# Patient Record
Sex: Female | Born: 1950 | Race: White | Hispanic: No | Smoking: Current some day smoker
Health system: Southern US, Community
[De-identification: ages and names within clinical notes are randomized; demographics above are authoritative.]

## PROBLEM LIST (undated history)

## (undated) DIAGNOSIS — Z789 Other specified health status: Secondary | ICD-10-CM

## (undated) DIAGNOSIS — E782 Mixed hyperlipidemia: Secondary | ICD-10-CM

## (undated) DIAGNOSIS — I214 Non-ST elevation (NSTEMI) myocardial infarction: Secondary | ICD-10-CM

## (undated) DIAGNOSIS — I219 Acute myocardial infarction, unspecified: Secondary | ICD-10-CM

## (undated) DIAGNOSIS — I10 Essential (primary) hypertension: Secondary | ICD-10-CM

## (undated) DIAGNOSIS — I2510 Atherosclerotic heart disease of native coronary artery without angina pectoris: Secondary | ICD-10-CM

## (undated) DIAGNOSIS — E669 Obesity, unspecified: Secondary | ICD-10-CM

## (undated) DIAGNOSIS — Z720 Tobacco use: Secondary | ICD-10-CM

## (undated) DIAGNOSIS — Z8719 Personal history of other diseases of the digestive system: Secondary | ICD-10-CM

## (undated) DIAGNOSIS — I82409 Acute embolism and thrombosis of unspecified deep veins of unspecified lower extremity: Secondary | ICD-10-CM

## (undated) HISTORY — PX: APPENDECTOMY: SHX54

## (undated) HISTORY — PX: MASS EXCISION: SHX2000

## (undated) HISTORY — PX: BREAST BIOPSY: SHX20

## (undated) HISTORY — DX: Mixed hyperlipidemia: E78.2

## (undated) HISTORY — PX: CATARACT EXTRACTION W/ INTRAOCULAR LENS IMPLANT: SHX1309

## (undated) HISTORY — DX: Acute myocardial infarction, unspecified: I21.9

## (undated) HISTORY — DX: Atherosclerotic heart disease of native coronary artery without angina pectoris: I25.10

---

## 1990-04-29 DIAGNOSIS — I82409 Acute embolism and thrombosis of unspecified deep veins of unspecified lower extremity: Secondary | ICD-10-CM

## 1990-04-29 HISTORY — DX: Acute embolism and thrombosis of unspecified deep veins of unspecified lower extremity: I82.409

## 2002-12-23 ENCOUNTER — Encounter: Payer: Self-pay | Admitting: Family Medicine

## 2002-12-23 ENCOUNTER — Ambulatory Visit (HOSPITAL_COMMUNITY): Admission: RE | Admit: 2002-12-23 | Discharge: 2002-12-23 | Payer: Self-pay

## 2002-12-29 ENCOUNTER — Encounter: Admission: RE | Admit: 2002-12-29 | Discharge: 2002-12-29 | Payer: Self-pay | Admitting: Family Medicine

## 2002-12-29 ENCOUNTER — Encounter: Payer: Self-pay | Admitting: Family Medicine

## 2003-08-04 ENCOUNTER — Ambulatory Visit (HOSPITAL_COMMUNITY): Admission: RE | Admit: 2003-08-04 | Discharge: 2003-08-04 | Payer: Self-pay | Admitting: Family Medicine

## 2006-02-05 ENCOUNTER — Ambulatory Visit (HOSPITAL_COMMUNITY): Admission: RE | Admit: 2006-02-05 | Discharge: 2006-02-05 | Payer: Self-pay | Admitting: Ophthalmology

## 2009-04-29 HISTORY — PX: CORONARY ANGIOPLASTY WITH STENT PLACEMENT: SHX49

## 2009-08-27 DIAGNOSIS — I219 Acute myocardial infarction, unspecified: Secondary | ICD-10-CM

## 2009-08-27 HISTORY — DX: Acute myocardial infarction, unspecified: I21.9

## 2011-02-15 ENCOUNTER — Encounter: Payer: Self-pay | Admitting: Cardiology

## 2011-02-18 ENCOUNTER — Ambulatory Visit: Payer: Self-pay | Admitting: Cardiology

## 2011-02-18 ENCOUNTER — Encounter: Payer: Self-pay | Admitting: Cardiology

## 2011-02-20 ENCOUNTER — Encounter: Payer: Self-pay | Admitting: Cardiology

## 2011-02-20 ENCOUNTER — Other Ambulatory Visit: Payer: Self-pay

## 2011-02-20 ENCOUNTER — Ambulatory Visit (INDEPENDENT_AMBULATORY_CARE_PROVIDER_SITE_OTHER): Payer: BC Managed Care – PPO | Admitting: Cardiology

## 2011-02-20 VITALS — BP 118/75 | HR 69 | Ht 64.0 in | Wt 200.8 lb

## 2011-02-20 DIAGNOSIS — E782 Mixed hyperlipidemia: Secondary | ICD-10-CM

## 2011-02-20 DIAGNOSIS — F17200 Nicotine dependence, unspecified, uncomplicated: Secondary | ICD-10-CM | POA: Insufficient documentation

## 2011-02-20 DIAGNOSIS — E785 Hyperlipidemia, unspecified: Secondary | ICD-10-CM | POA: Insufficient documentation

## 2011-02-20 DIAGNOSIS — I2510 Atherosclerotic heart disease of native coronary artery without angina pectoris: Secondary | ICD-10-CM

## 2011-02-20 DIAGNOSIS — R609 Edema, unspecified: Secondary | ICD-10-CM | POA: Insufficient documentation

## 2011-02-20 MED ORDER — NITROGLYCERIN 0.4 MG SL SUBL: 0.4000 mg | SUBLINGUAL_TABLET | SUBLINGUAL | Status: DC | PRN

## 2011-02-20 NOTE — Assessment & Plan Note (Signed)
Most likely related to venous insufficiency. We discussed sodium restriction and weight loss. She has also used compression hose on the left.

## 2011-02-20 NOTE — Assessment & Plan Note (Signed)
Reasonably stable from a symptom perspective. ECG is reviewed, overall improved in comparison to old tracings. Plan at this point is continuing medical therapy and observation. 2-D echocardiogram is being obtained to ensure that LVEF remains normal, particularly in light of her edema. LVEF was 40% around the time of her infarct, although reportedly improved to normal subsequent to intervention. We did discuss diet and exercise with a goal of weight loss, also smoking cessation.

## 2011-02-20 NOTE — Assessment & Plan Note (Signed)
Followup fasting lipid profile and liver function tests on Crestor.

## 2011-02-20 NOTE — Patient Instructions (Signed)
**Note De-identified Vallarie Fei Obfuscation** Your physician has requested that you have an echocardiogram. Echocardiography is a painless test that uses sound waves to create images of your heart. It provides your doctor with information about the size and shape of your heart and how well your heart's chambers and valves are working. This procedure takes approximately one hour. There are no restrictions for this procedure.  Your physician recommends that you return for lab work in: today  Your physician recommends that you schedule a follow-up appointment in: 6 months   

## 2011-02-20 NOTE — Assessment & Plan Note (Signed)
Were discussed smoking cessation strategies today.

## 2011-02-20 NOTE — Progress Notes (Signed)
Clinical Summary Stacy Douglas is a 60 y.o.female presenting to establish cardiology followup. She recently moved here from [place], and has not yet established with a primary care physician. Cardiac history was reviewed. She was previously followed by a Dr. Kathe Douglas status post interventions as outlined. She did undergo an exercise echocardiogram in June of last year which was reportedly normal, with no ischemia, and LVEF improved to 65%.  She reports occasional, fleeting, atypical chest pain, but is not specifically exertional. No progressive shortness of breath. She states she has gained a significant amount of weight, and has not been exercising. She reports traveling a lot until recently, and eating out.  She has had some left ankle edema, which has been a problem for her intermittently over the years following reportedly a "blood clot" several years ago around pregnancy. She has used compression hose on that side before, and presumably has a degree of residual venous insufficiency.  No recent followup lab work. She reports compliance with her medications.  She has cut back cigarette smoking, although not completely stopped. We discussed smoking cessation today, diet, sodium restriction, and exercise plan.  No Known Allergies  Medication list reviewed.  Past Medical History  Diagnosis Date  . Coronary atherosclerosis of native coronary artery     BMS LAD 5/11, PTCA circumflex 6/11, LVEF 40%  . Mixed hyperlipidemia   . 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 Type 2 diabetes mellitus   . Essential hypertension, benign     Past Surgical History  Procedure Date  . Cervical spine surgery   . Appendectomy   . Right breast biopsy     Family History  Problem Relation Age of Onset  . Coronary artery disease Father     MI age 74    Social History Stacy Douglas reports that she has been smoking Cigarettes.  She does not have any smokeless tobacco history on  file. Stacy Douglas reports that she drinks alcohol.  Review of Systems No palpitations or syncope. No orthopnea or PND. No reported bleeding problems on DAPT. Otherwise negative.  Physical Examination Filed Vitals:   02/20/11 0855  BP: 118/75  Pulse: 69   Overweight woman in no acute distress. HEENT: Conjunctiva and lids normal, oropharynx with moist mucosa. Neck: Supple, no elevated JVP or carotid bruits, no thyromegaly. Lungs: Clear to auscultation, nonlabored. Cardiac: Regular rate and rhythm, no significant murmur or gallop, no rub. Abdomen: Soft, nontender, bowel sounds present, no bruits. Skin: Warm and dry. Extremities: No pitting edema, distal pulses full. There is trace left ankle edema. Musculoskeletal: No kyphosis. Neuropsychiatric: Alert and oriented x3, affect appropriate.  ECG Sinus rhythm at 69 with borderline low voltage, nonspecific T-wave changes.    Problem List and Plan

## 2011-02-21 ENCOUNTER — Ambulatory Visit (HOSPITAL_COMMUNITY)
Admission: RE | Admit: 2011-02-21 | Discharge: 2011-02-21 | Disposition: A | Discharge status: Home / Self Care | Payer: BC Managed Care – PPO | Source: Ambulatory Visit | Attending: Cardiology | Admitting: Cardiology

## 2011-02-21 DIAGNOSIS — I2510 Atherosclerotic heart disease of native coronary artery without angina pectoris: Secondary | ICD-10-CM

## 2011-02-21 DIAGNOSIS — E785 Hyperlipidemia, unspecified: Secondary | ICD-10-CM | POA: Insufficient documentation

## 2011-02-21 DIAGNOSIS — R609 Edema, unspecified: Secondary | ICD-10-CM

## 2011-02-21 DIAGNOSIS — F17200 Nicotine dependence, unspecified, uncomplicated: Secondary | ICD-10-CM | POA: Insufficient documentation

## 2011-02-21 DIAGNOSIS — R0789 Other chest pain: Secondary | ICD-10-CM | POA: Insufficient documentation

## 2011-02-21 LAB — LIPID PANEL
LDL Cholesterol: 79 mg/dL (ref 0–99)
Total CHOL/HDL Ratio: 3.2 Ratio

## 2011-02-21 LAB — HEPATIC FUNCTION PANEL
AST: 16 U/L (ref 0–37)
Alkaline Phosphatase: 68 U/L (ref 39–117)
Total Bilirubin: 0.4 mg/dL (ref 0.3–1.2)

## 2011-02-21 NOTE — Progress Notes (Signed)
*  PRELIMINARY RESULTS* Echocardiogram 2D Echocardiogram has been performed.  Stacy Douglas 02/21/2011, 8:49 AM

## 2011-02-22 ENCOUNTER — Telehealth: Payer: Self-pay | Admitting: Cardiology

## 2011-02-22 NOTE — Telephone Encounter (Signed)
Would like results of Echo and blood work done on 02/21/11.Marland Kitchen / tg

## 2011-04-29 ENCOUNTER — Telehealth: Payer: Self-pay | Admitting: Cardiology

## 2011-04-29 MED ORDER — NITROGLYCERIN 0.4 MG SL SUBL: 0.4000 mg | SUBLINGUAL_TABLET | SUBLINGUAL | Status: DC | PRN

## 2011-04-29 MED ORDER — METOPROLOL SUCCINATE ER 50 MG PO TB24: 25.0000 mg | ORAL_TABLET | Freq: Every day | ORAL | Status: DC

## 2011-04-29 MED ORDER — ROSUVASTATIN CALCIUM 5 MG PO TABS: 5.0000 mg | ORAL_TABLET | Freq: Every day | ORAL | Status: DC

## 2011-04-29 MED ORDER — RAMIPRIL 1.25 MG PO CAPS: 1.2500 mg | ORAL_CAPSULE | Freq: Every day | ORAL | Status: DC

## 2011-04-29 MED ORDER — CLOPIDOGREL BISULFATE 75 MG PO TABS: 75.0000 mg | ORAL_TABLET | Freq: Every day | ORAL | Status: DC

## 2011-04-29 NOTE — Telephone Encounter (Signed)
Patient needs her RX's called to [facility] in [place].  States they have all expired. / tg

## 2011-07-09 ENCOUNTER — Emergency Department (HOSPITAL_COMMUNITY)
Admission: EM | Admit: 2011-07-09 | Discharge: 2011-07-09 | Disposition: A | Discharge status: Home Health | Payer: BC Managed Care – PPO | Attending: Emergency Medicine | Admitting: Emergency Medicine

## 2011-07-09 ENCOUNTER — Other Ambulatory Visit: Payer: Self-pay

## 2011-07-09 ENCOUNTER — Emergency Department (HOSPITAL_COMMUNITY): Payer: BC Managed Care – PPO

## 2011-07-09 ENCOUNTER — Encounter (HOSPITAL_COMMUNITY): Payer: Self-pay | Admitting: Emergency Medicine

## 2011-07-09 DIAGNOSIS — M549 Dorsalgia, unspecified: Secondary | ICD-10-CM | POA: Insufficient documentation

## 2011-07-09 DIAGNOSIS — Z79899 Other long term (current) drug therapy: Secondary | ICD-10-CM | POA: Insufficient documentation

## 2011-07-09 DIAGNOSIS — I10 Essential (primary) hypertension: Secondary | ICD-10-CM | POA: Insufficient documentation

## 2011-07-09 DIAGNOSIS — E119 Type 2 diabetes mellitus without complications: Secondary | ICD-10-CM | POA: Insufficient documentation

## 2011-07-09 DIAGNOSIS — Z9889 Other specified postprocedural states: Secondary | ICD-10-CM | POA: Insufficient documentation

## 2011-07-09 DIAGNOSIS — Z7982 Long term (current) use of aspirin: Secondary | ICD-10-CM | POA: Insufficient documentation

## 2011-07-09 DIAGNOSIS — I2510 Atherosclerotic heart disease of native coronary artery without angina pectoris: Secondary | ICD-10-CM | POA: Insufficient documentation

## 2011-07-09 DIAGNOSIS — I252 Old myocardial infarction: Secondary | ICD-10-CM | POA: Insufficient documentation

## 2011-07-09 DIAGNOSIS — F17200 Nicotine dependence, unspecified, uncomplicated: Secondary | ICD-10-CM | POA: Insufficient documentation

## 2011-07-09 DIAGNOSIS — E782 Mixed hyperlipidemia: Secondary | ICD-10-CM | POA: Insufficient documentation

## 2011-07-09 DIAGNOSIS — Y92410 Unspecified street and highway as the place of occurrence of the external cause: Secondary | ICD-10-CM | POA: Insufficient documentation

## 2011-07-09 DIAGNOSIS — R071 Chest pain on breathing: Secondary | ICD-10-CM | POA: Insufficient documentation

## 2011-07-09 MED ORDER — ACETAMINOPHEN 325 MG PO TABS
650.0000 mg | ORAL_TABLET | Freq: Once | ORAL | Status: AC
  Administered 2011-07-09: 650 mg via ORAL
  Filled 2011-07-09: qty 2

## 2011-07-09 MED ORDER — IBUPROFEN 800 MG PO TABS
800.0000 mg | ORAL_TABLET | Freq: Once | ORAL | Status: AC
  Administered 2011-07-09: 800 mg via ORAL
  Filled 2011-07-09: qty 1
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
## 2011-07-09 NOTE — Discharge Instructions (Signed)

## 2011-08-22 ENCOUNTER — Ambulatory Visit (INDEPENDENT_AMBULATORY_CARE_PROVIDER_SITE_OTHER): Payer: BC Managed Care – PPO | Admitting: Adult Health

## 2011-08-22 ENCOUNTER — Encounter: Payer: Self-pay | Admitting: Cardiology

## 2011-08-22 VITALS — BP 134/84 | HR 69 | Resp 16 | Ht 64.0 in | Wt 192.0 lb

## 2011-08-22 DIAGNOSIS — I2510 Atherosclerotic heart disease of native coronary artery without angina pectoris: Secondary | ICD-10-CM

## 2011-08-22 DIAGNOSIS — E782 Mixed hyperlipidemia: Secondary | ICD-10-CM

## 2011-08-22 MED ORDER — CLOPIDOGREL BISULFATE 75 MG PO TABS: 75.0000 mg | ORAL_TABLET | Freq: Every day | ORAL | Status: DC

## 2011-08-22 NOTE — Patient Instructions (Signed)
**Note De-Identified Stacy Douglas Obfuscation** Your physician has requested that you have en exercise stress myoview. For further information please visit [email or web address] Please follow instruction sheet, as given.  Your physician recommends that you return for lab work in: tomorrow  Your physician recommends that you schedule a follow-up appointment in: after test
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
## 2011-08-22 NOTE — Assessment & Plan Note (Signed)
Repeat lipids and LFT's in am.

## 2011-08-23 ENCOUNTER — Ambulatory Visit (HOSPITAL_COMMUNITY): Payer: BC Managed Care – PPO

## 2011-08-23 ENCOUNTER — Encounter (HOSPITAL_COMMUNITY)
Admission: RE | Admit: 2011-08-23 | Discharge: 2011-08-23 | Disposition: A | Discharge status: Home / Self Care | Payer: BC Managed Care – PPO | Source: Ambulatory Visit | Attending: Adult Health | Admitting: Adult Health

## 2011-08-23 ENCOUNTER — Encounter (HOSPITAL_COMMUNITY): Payer: BC Managed Care – PPO

## 2011-08-23 ENCOUNTER — Ambulatory Visit (INDEPENDENT_AMBULATORY_CARE_PROVIDER_SITE_OTHER): Payer: BC Managed Care – PPO

## 2011-08-23 ENCOUNTER — Encounter (HOSPITAL_COMMUNITY): Payer: Self-pay

## 2011-08-23 DIAGNOSIS — I2510 Atherosclerotic heart disease of native coronary artery without angina pectoris: Secondary | ICD-10-CM

## 2011-08-23 DIAGNOSIS — R079 Chest pain, unspecified: Secondary | ICD-10-CM | POA: Insufficient documentation

## 2011-08-23 LAB — HEPATIC FUNCTION PANEL
ALT: 16 U/L (ref 0–35)
AST: 19 U/L (ref 0–37)
Albumin: 4.8 g/dL (ref 3.5–5.2)
Alkaline Phosphatase: 66 U/L (ref 39–117)
Bilirubin, Direct: 0.1 mg/dL (ref 0.0–0.3)
Indirect Bilirubin: 0.2 mg/dL (ref 0.0–0.9)
Total Bilirubin: 0.3 mg/dL (ref 0.3–1.2)
Total Protein: 6.8 g/dL (ref 6.0–8.3)

## 2011-08-23 LAB — LIPID PANEL
LDL Cholesterol: 82 mg/dL (ref 0–99)
Total CHOL/HDL Ratio: 3.3 Ratio
Triglycerides: 66 mg/dL (ref <150)
VLDL: 13 mg/dL (ref 0–40)

## 2011-08-23 MED ORDER — TECHNETIUM TC 99M TETROFOSMIN IV KIT
10.0000 | PACK | Freq: Once | INTRAVENOUS | Status: AC | PRN
  Administered 2011-08-23: 10 via INTRAVENOUS

## 2011-08-23 MED ORDER — TECHNETIUM TC 99M TETROFOSMIN IV KIT
30.0000 | PACK | Freq: Once | INTRAVENOUS | Status: AC | PRN
  Administered 2011-08-23: 30.2 via INTRAVENOUS

## 2011-08-23 NOTE — Progress Notes (Signed)
Stress Lab Nurses Notes - [facility]  REMA LIEVANOS 08/23/2011 Reason for doing test: Chest Pain and left shoulder  pain Type of test: Stress Myoview Nurse performing test: Parke Poisson, RN Nuclear Medicine Tech: Lyndel Pleasure Echo Tech: Not Applicable MD performing test: Ival Bible & Joni Reining NP Family MD: NPCP Test explained and consent signed: yes IV started: 22g jelco, Saline lock flushed, No redness or edema and Saline lock started in radiology Symptoms: left shoulder pain & SOB Treatment/Intervention: None Reason test stopped: fatigue and reached target HR After recovery IV was: Discontinued via X-ray tech and No redness or edema Patient to return to Nuc. Med at : 11:30 Patient discharged: Home Patient's Condition upon discharge was: stable Comments: During test peak BP 162/62 & HR 142.  Recovery BP 120/58 & HR 89.  Symptoms resolved in recovery. Erskine Speed T

## 2011-08-26 ENCOUNTER — Telehealth: Payer: Self-pay

## 2011-08-26 NOTE — Telephone Encounter (Signed)
She is recommended to stay on all cardiac medications at this time. Can discuss taking her off of Plavix on next visit.

## 2011-08-26 NOTE — Telephone Encounter (Signed)
**Note De-Identified Stacy Douglas Obfuscation** Per pt. Request, detailed message left on pt's VM./LV

## 2011-08-26 NOTE — Telephone Encounter (Signed)
**Note De-Identified Kasten Leveque Obfuscation** Pt. given normal lab and stress test results. She wants to know if she can d/c any of her meds. Please advise./LV

## 2011-09-03 ENCOUNTER — Ambulatory Visit: Payer: BC Managed Care – PPO | Admitting: Adult Health

## 2012-01-27 ENCOUNTER — Other Ambulatory Visit: Payer: Self-pay | Admitting: Cardiology

## 2012-05-04 ENCOUNTER — Telehealth: Payer: Self-pay | Admitting: Cardiology

## 2012-05-04 NOTE — Telephone Encounter (Signed)
Spoke to pt to advise results/instructions. Pt understood. Pt will discuss any medication changes and lab draws at upcoming OV

## 2012-05-04 NOTE — Telephone Encounter (Signed)
PT IS SCHEDULED FOR 05/08/11 WITH K LAWRENCE, SHE WANTS TO KNOW IF SHE IS DUE FOR LAB WORK/TMJ

## 2012-05-04 NOTE — Telephone Encounter (Signed)
Called pt to advise notations in chart do NOT indicate a need for labs prior to office visit, left vm to call office

## 2012-05-07 ENCOUNTER — Ambulatory Visit: Payer: BC Managed Care – PPO | Admitting: Adult Health

## 2012-05-11 ENCOUNTER — Ambulatory Visit (INDEPENDENT_AMBULATORY_CARE_PROVIDER_SITE_OTHER): Payer: BC Managed Care – PPO | Admitting: Adult Health

## 2012-05-11 ENCOUNTER — Encounter: Payer: Self-pay | Admitting: Adult Health

## 2012-05-11 VITALS — BP 118/68 | HR 68 | Ht 64.0 in | Wt 184.4 lb

## 2012-05-11 DIAGNOSIS — I10 Essential (primary) hypertension: Secondary | ICD-10-CM

## 2012-05-11 DIAGNOSIS — I2510 Atherosclerotic heart disease of native coronary artery without angina pectoris: Secondary | ICD-10-CM

## 2012-05-11 DIAGNOSIS — E7800 Pure hypercholesterolemia, unspecified: Secondary | ICD-10-CM

## 2012-05-11 DIAGNOSIS — F17200 Nicotine dependence, unspecified, uncomplicated: Secondary | ICD-10-CM

## 2012-05-11 DIAGNOSIS — E782 Mixed hyperlipidemia: Secondary | ICD-10-CM

## 2012-05-11 MED ORDER — RAMIPRIL 1.25 MG PO CAPS: 1.2500 mg | ORAL_CAPSULE | Freq: Every day | ORAL | Status: DC

## 2012-05-11 MED ORDER — CLOPIDOGREL BISULFATE 75 MG PO TABS: 75.0000 mg | ORAL_TABLET | Freq: Every day | ORAL | Status: DC

## 2012-05-11 MED ORDER — METOPROLOL SUCCINATE ER 50 MG PO TB24: 25.0000 mg | ORAL_TABLET | Freq: Every day | ORAL | Status: DC

## 2012-05-11 MED ORDER — NITROGLYCERIN 0.4 MG SL SUBL: 0.4000 mg | SUBLINGUAL_TABLET | SUBLINGUAL | Status: DC | PRN

## 2012-05-11 NOTE — Assessment & Plan Note (Signed)
She requests stopping the crestor for a minimum of 3 months and take Fish Oil instead. She is given recommendation for Mega Red fish oil. I have discussed with her the need to reduced the cholesterol in her diet.   Review of last lipid check demonstrates TC 137 LDL 82, TG 66. I have also spoken with her about the anti-inflammatory benefits of statins as well. She is willing to go back on statin if her cholesterol levels increase with lifestyle changes and fish oil. Follow up lipids and LFT's will be completed in 3 months.
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
## 2012-05-11 NOTE — Patient Instructions (Addendum)
Your physician recommends that you schedule a follow-up appointment in: 6 months with kl  Your physician has recommended you make the following change in your medication:   1)STOP CRESTOR FOR 3 MONTHS  Your physician recommends that you return for lab work in: 3 MONTHS (WE WILL SEND YOU A REMINDER LETTER)
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
## 2012-05-11 NOTE — Assessment & Plan Note (Signed)
We have discussed the stress test results which are normal without indication for recurrent ischemia causing her symptoms. She is given reassurance. I have discussed stopping the plavix, but she wants to continue this medication indefinitely along with ASA. As she is tolerating this medication and continues to smoke, I will refill the Rx.

## 2012-05-11 NOTE — Assessment & Plan Note (Signed)
Smoking cessation has been discussed. She is willing to entertain the idea of quitting but is not yet ready.

## 2012-05-20 ENCOUNTER — Other Ambulatory Visit: Payer: Self-pay | Admitting: Cardiology

## 2012-07-07 ENCOUNTER — Other Ambulatory Visit: Payer: Self-pay | Admitting: Cardiology

## 2012-07-07 DIAGNOSIS — I10 Essential (primary) hypertension: Secondary | ICD-10-CM

## 2012-07-07 MED ORDER — CLOPIDOGREL BISULFATE 75 MG PO TABS: 75.0000 mg | ORAL_TABLET | Freq: Every day | ORAL | Status: DC

## 2012-11-19 ENCOUNTER — Encounter: Payer: Self-pay | Admitting: *Deleted

## 2012-11-19 ENCOUNTER — Other Ambulatory Visit: Payer: Self-pay | Admitting: *Deleted

## 2012-11-19 DIAGNOSIS — I10 Essential (primary) hypertension: Secondary | ICD-10-CM

## 2012-11-19 DIAGNOSIS — I2510 Atherosclerotic heart disease of native coronary artery without angina pectoris: Secondary | ICD-10-CM

## 2012-11-19 DIAGNOSIS — E7800 Pure hypercholesterolemia, unspecified: Secondary | ICD-10-CM

## 2013-01-03 ENCOUNTER — Other Ambulatory Visit: Payer: Self-pay | Admitting: Adult Health

## 2013-01-22 ENCOUNTER — Encounter: Payer: BC Managed Care – PPO | Admitting: Adult Health

## 2013-01-22 NOTE — Progress Notes (Signed)
   HPI: Stacy Douglas is a 62-year-old patient of Dr. Diona Browner we are following for ongoing assessment and management of CAD, with known history of myocardial infarction in 2011, requiring stent to the circumflex. She was continued on dual antiplatelet therapy with aspirin and Plavix. She was last seen in the office in January of 2014 to discuss results of stress Myoview which was completed in April of 2013. Stress test determine no indication for recurrent ischemia causing symptoms of chest discomfort. She was given reassurance. At that time we discussed stopping Plavix per [facility] guidelines, but she wished to continue this medication indefinitely along with aspirin.   Also she requested stopping Crestor and take fish oil instead for symptoms of myalgias. She was given recommendation for may correct this well. She was also given a low cholesterol diet. She needs a followup lipid and LFT study today.  Allergies  Allergen Reactions  . Codeine Nausea And Vomiting    Current Outpatient Prescriptions  Medication Sig Dispense Refill  . aspirin EC 81 MG tablet Take 81 mg by mouth daily.      . clopidogrel (PLAVIX) 75 MG tablet Take 1 tablet (75 mg total) by mouth daily.  30 tablet  6  . metoprolol succinate (TOPROL-XL) 50 MG 24 hr tablet Take 1 tablet (50 mg total) by mouth daily.  30 tablet  6  . metoprolol succinate (TOPROL-XL) 50 MG 24 hr tablet TAKE 1/2 TABLET BY MOUTH DAILY  30 tablet  5  . nitroGLYCERIN (NITROSTAT) 0.4 MG SL tablet Place 1 tablet (0.4 mg total) under the tongue every 5 (five) minutes as needed.  25 tablet  3  . ramipril (ALTACE) 1.25 MG capsule TAKE 1 CAPSULE (1.25 MG TOTAL) BY MOUTH DAILY.  30 capsule  6  . rosuvastatin (CRESTOR) 10 MG tablet Take 10 mg by mouth daily.       No current facility-administered medications for this visit.    Past Medical History  Diagnosis Date  . Coronary atherosclerosis of native coronary artery     BMS LAD 5/11, PTCA circumflex 6/11, LVEF 40%  .  Mixed hyperlipidemia   . 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 Essential hypertension, benign     Past Surgical History  Procedure Laterality Date  . Cervical spine surgery    . Appendectomy    . Right breast biopsy      ROS: PHYSICAL EXAM There were no vitals taken for this visit.  EKG:  ASSESSMENT AND PLAN

## 2013-02-01 ENCOUNTER — Other Ambulatory Visit: Payer: Self-pay | Admitting: Adult Health

## 2013-02-03 ENCOUNTER — Other Ambulatory Visit: Payer: Self-pay | Admitting: Adult Health

## 2013-02-04 ENCOUNTER — Encounter: Payer: Self-pay | Admitting: *Deleted

## 2013-02-04 ENCOUNTER — Encounter: Payer: Self-pay | Admitting: Adult Health

## 2013-02-04 ENCOUNTER — Ambulatory Visit (INDEPENDENT_AMBULATORY_CARE_PROVIDER_SITE_OTHER): Payer: BC Managed Care – PPO | Admitting: Adult Health

## 2013-02-04 VITALS — BP 131/85 | HR 85 | Ht 64.0 in | Wt 195.0 lb

## 2013-02-04 DIAGNOSIS — F17200 Nicotine dependence, unspecified, uncomplicated: Secondary | ICD-10-CM

## 2013-02-04 DIAGNOSIS — E7800 Pure hypercholesterolemia, unspecified: Secondary | ICD-10-CM

## 2013-02-04 DIAGNOSIS — I10 Essential (primary) hypertension: Secondary | ICD-10-CM

## 2013-02-04 DIAGNOSIS — E782 Mixed hyperlipidemia: Secondary | ICD-10-CM

## 2013-02-04 DIAGNOSIS — I2510 Atherosclerotic heart disease of native coronary artery without angina pectoris: Secondary | ICD-10-CM

## 2013-02-04 LAB — LIPID PANEL
Cholesterol: 170 mg/dL (ref 0–200)
HDL: 43 mg/dL (ref >39)
Total CHOL/HDL Ratio: 4 Ratio
Triglycerides: 73 mg/dL (ref <150)
VLDL: 15 mg/dL (ref 0–40)

## 2013-02-04 LAB — HEPATIC FUNCTION PANEL
AST: 16 U/L (ref 0–37)
Bilirubin, Direct: 0.1 mg/dL (ref 0.0–0.3)
Total Bilirubin: 0.4 mg/dL (ref 0.3–1.2)

## 2013-02-04 MED ORDER — METOPROLOL SUCCINATE ER 50 MG PO TB24: ORAL_TABLET | ORAL | Status: DC

## 2013-02-04 MED ORDER — CLOPIDOGREL BISULFATE 75 MG PO TABS: 75.0000 mg | ORAL_TABLET | Freq: Every day | ORAL | Status: DC

## 2013-02-04 NOTE — Progress Notes (Signed)
    HPI: Stacy Douglas is a 62-year-old patient of Dr. Diona Browner, we are following for ongoing assessment treatment of CAD with known history of a myocardial infarction in 2011 requiring stents to the circumflex. And is continued on dual and platelet therapy. She was last in our office in January 2014 she would discuss results of stress Myoview which was found be negative for ischemia. She was given reassurance. At that time she also requested stopping Crestor for 3 months and taking fish oil instead as she was having some myalgia. She is to followup lipid panel and LFTs.   These were completed on 02/03/2013 with a total cholesterol of 170, yellow 112, triglycerides 73 with HDL of 43.     She has a complaint of a cough which happens with and without exertion., She states that it becomes raspy. She unfortunately continues to smoke but is cutting down with a stop date of Dec 31st. No chest pain or dizziness,  Allergies  Allergen Reactions  . Codeine Nausea And Vomiting    Current Outpatient Prescriptions  Medication Sig Dispense Refill  . aspirin EC 81 MG tablet Take 81 mg by mouth daily.      . clopidogrel (PLAVIX) 75 MG tablet Take 1 tablet (75 mg total) by mouth daily.  30 tablet  6  . metoprolol succinate (TOPROL-XL) 50 MG 24 hr tablet TAKE 1/2 TABLET BY MOUTH DAILY  30 tablet  5  . metoprolol succinate (TOPROL-XL) 50 MG 24 hr tablet TAKE 1 TABLET (50 MG TOTAL) BY MOUTH DAILY.  30 tablet  6  . nitroGLYCERIN (NITROSTAT) 0.4 MG SL tablet Place 1 tablet (0.4 mg total) under the tongue every 5 (five) minutes as needed.  25 tablet  3  . ramipril (ALTACE) 1.25 MG capsule TAKE 1 CAPSULE (1.25 MG TOTAL) BY MOUTH DAILY.  30 capsule  6   No current facility-administered medications for this visit.    Past Medical History  Diagnosis Date  . Coronary atherosclerosis of native coronary artery     BMS LAD 5/11, PTCA circumflex 6/11, LVEF 40%  . Mixed hyperlipidemia   . Myocardial infarction     5/11,  complicated by VF arrest  . Essential hypertension, benign     Past Surgical History  Procedure Laterality Date  . Cervical spine surgery    . Appendectomy    . Right breast biopsy      QMV:HQIONG of systems complete and found to be negative unless listed above   PHYSICAL EXAM BP 131/85  Pulse 85  Ht 5\' 4"  (1.626 m)  Wt 195 lb (88.451 kg)  BMI 33.46 kg/m2  General: Well developed, well nourished, in no acute distress Head: Eyes PERRLA, No xanthomas.   Normal cephalic and atramatic  Lungs: Clear bilaterally to auscultation and percussion. Heart: HRRR S1 S2, without MRG.  Pulses are 2+ & equal.            No carotid bruit. No JVD.  No abdominal bruits. No femoral bruits. Abdomen: Bowel sounds are positive, abdomen soft and non-tender without masses or                  Hernia's noted. Msk:  Back normal, normal gait. Normal strength and tone for age. Extremities: No clubbing, cyanosis or edema.  DP +1 Neuro: Alert and oriented X 3. Psych:  Good affect, responds appropriately    ASSESSMENT AND PLAN

## 2013-02-04 NOTE — Assessment & Plan Note (Signed)
She is without cardiac complaint with the exception of a raspy cough at times. She continues to smoke, and is quitting slowly. I have asked her to stop the altace 1.25 for about a week to see if her cough is ACE related. If this does not change things she is to go back on it. I suspect it is related to her smoking.

## 2013-02-04 NOTE — Assessment & Plan Note (Signed)
  She continues to smoke but is quitting by cutting down. She plans to be completely stopped by December of 2014.

## 2013-02-04 NOTE — Assessment & Plan Note (Addendum)
She has been off of her crestor for 6 months with elevation in TC to 170 from 137. LDL is up as well. She wishes to continue on fish oil and diet restrictions. I have given her a copy of the labs to take home. She is advised to begin a walking program beginning at 15 minutes a day and working up to 30 minutes a day. She has a treadmill.

## 2013-02-04 NOTE — Patient Instructions (Signed)
Your physician recommends that you schedule a follow-up appointment in: 6 months You will receive a reminder letter two months in advance reminding you to call and schedule your appointment. If you don't receive this letter, please contact our office.  Your physician has recommended you make the following change in your medication:  1. Hold Altace and call office to see how you are doing.

## 2013-02-04 NOTE — Progress Notes (Deleted)
Name: Stacy Douglas    DOB: Oct 25, 1950  Age: 62 y.o.  MR#: [id number]       PCP:  No primary provider on file.      Insurance: Payor: Pharmacist, hospital / Plan: BCBS PPO OUT OF STATE / Product Type: *No Product type* /   CC:    Chief Complaint  Patient presents with  . Hypertension  . Coronary Artery Disease    VS Filed Vitals:   02/04/13 1412  BP: 131/85  Pulse: 85  Height: 5\' 4"  (1.626 m)  Weight: 195 lb (88.451 kg)    Weights Current Weight  02/04/13 195 lb (88.451 kg)  05/11/12 184 lb 6.4 oz (83.643 kg)  08/22/11 192 lb (87.091 kg)    Blood Pressure  BP Readings from Last 3 Encounters:  02/04/13 131/85  05/11/12 118/68  08/22/11 134/84     Admit date:  (Not on file) Last encounter with RMR:  02/01/2013   Allergy Codeine  Current Outpatient Prescriptions  Medication Sig Dispense Refill  . aspirin EC 81 MG tablet Take 81 mg by mouth daily.      . clopidogrel (PLAVIX) 75 MG tablet Take 1 tablet (75 mg total) by mouth daily.  30 tablet  6  . metoprolol succinate (TOPROL-XL) 50 MG 24 hr tablet TAKE 1/2 TABLET BY MOUTH DAILY  30 tablet  5  . metoprolol succinate (TOPROL-XL) 50 MG 24 hr tablet TAKE 1 TABLET (50 MG TOTAL) BY MOUTH DAILY.  30 tablet  6  . nitroGLYCERIN (NITROSTAT) 0.4 MG SL tablet Place 1 tablet (0.4 mg total) under the tongue every 5 (five) minutes as needed.  25 tablet  3  . ramipril (ALTACE) 1.25 MG capsule TAKE 1 CAPSULE (1.25 MG TOTAL) BY MOUTH DAILY.  30 capsule  6   No current facility-administered medications for this visit.    Discontinued Meds:    Medications Discontinued During This Encounter  Medication Reason  . rosuvastatin (CRESTOR) 10 MG tablet Error    Patient Active Problem List   Diagnosis Date Noted  . Coronary atherosclerosis of native coronary artery 02/20/2011  . Edema 02/20/2011  . Mixed hyperlipidemia 02/20/2011  . Tobacco use disorder 02/20/2011    LABS No results found for this basename: na, k, cl, co2, glucose,  bun, creatinine, calcium, gfrnonaa, gfraa   CMP     Component Value Date/Time   PROT 6.9 02/03/2013 1004   ALBUMIN 4.3 02/03/2013 1004   AST 16 02/03/2013 1004   ALT 13 02/03/2013 1004   ALKPHOS 68 02/03/2013 1004   BILITOT 0.4 02/03/2013 1004    No results found for this basename: wbc, hgb, hct, mcv, platelets    Lipid Panel     Component Value Date/Time   CHOL 170 02/03/2013 1004   TRIG 73 02/03/2013 1004   HDL 43 02/03/2013 1004   CHOLHDL 4.0 02/03/2013 1004   VLDL 15 02/03/2013 1004   LDLCALC 112* 02/03/2013 1004    ABG No results found for this basename: phart, pco2, pco2art, po2, po2art, hco3, tco2, acidbasedef, o2sat     No results found for this basename: TSH   BNP (last 3 results) No results found for this basename: PROBNP,  in the last 8760 hours Cardiac Panel (last 3 results) No results found for this basename: CKTOTAL, CKMB, TROPONINI, RELINDX,  in the last 72 hours  Iron/TIBC/Ferritin No results found for this basename: iron, tibc, ferritin     EKG Orders placed in visit on 05/11/12  .  EKG 12-LEAD     Prior Assessment and Plan Problem List as of 02/04/2013     Cardiovascular and Mediastinum   Coronary atherosclerosis of native coronary artery   Last Assessment & Plan   05/11/2012 Office Visit Written 05/11/2012  5:09 PM by Jodelle Gross, NP     We have discussed the stress test results which are normal without indication for recurrent ischemia causing her symptoms. She is given reassurance. I have discussed stopping the plavix, but she wants to continue this medication indefinitely along with ASA. As she is tolerating this medication and continues to smoke, I will refill the Rx.      Other   Edema   Last Assessment & Plan   02/20/2011 Office Visit Written 02/20/2011 10:29 AM by Jonelle Sidle, MD     Most likely related to venous insufficiency. We discussed sodium restriction and weight loss. She has also used compression hose on the left.    Mixed  hyperlipidemia   Last Assessment & Plan   05/11/2012 Office Visit Written 05/11/2012  5:12 PM by Jodelle Gross, NP     She requests stopping the crestor for a minimum of 3 months and take Fish Oil instead. She is given recommendation for Mega Red fish oil. I have discussed with her the need to reduced the cholesterol in her diet.   Review of last lipid check demonstrates TC 137 LDL 82, TG 66. I have also spoken with her about the anti-inflammatory benefits of statins as well. She is willing to go back on statin if her cholesterol levels increase with lifestyle changes and fish oil. Follow up lipids and LFT's will be completed in 3 months.    Tobacco use disorder   Last Assessment & Plan   05/11/2012 Office Visit Written 05/11/2012  5:13 PM by Jodelle Gross, NP     Smoking cessation has been discussed. She is willing to entertain the idea of quitting but is not yet ready.        Imaging: No results found.

## 2013-02-04 NOTE — Addendum Note (Signed)
Addended by: Thompson Grayer on: 02/04/2013 02:54 PM   Modules accepted: Orders, Medications

## 2013-07-30 ENCOUNTER — Ambulatory Visit (INDEPENDENT_AMBULATORY_CARE_PROVIDER_SITE_OTHER): Payer: BC Managed Care – PPO | Admitting: Adult Health

## 2013-07-30 ENCOUNTER — Encounter: Payer: Self-pay | Admitting: Adult Health

## 2013-07-30 VITALS — BP 126/72 | HR 79 | Ht 64.0 in | Wt 201.0 lb

## 2013-07-30 DIAGNOSIS — I2510 Atherosclerotic heart disease of native coronary artery without angina pectoris: Secondary | ICD-10-CM

## 2013-07-30 DIAGNOSIS — E782 Mixed hyperlipidemia: Secondary | ICD-10-CM

## 2013-07-30 DIAGNOSIS — F17200 Nicotine dependence, unspecified, uncomplicated: Secondary | ICD-10-CM

## 2013-07-30 DIAGNOSIS — I10 Essential (primary) hypertension: Secondary | ICD-10-CM

## 2013-07-30 DIAGNOSIS — E7800 Pure hypercholesterolemia, unspecified: Secondary | ICD-10-CM

## 2013-07-30 MED ORDER — CLOPIDOGREL BISULFATE 75 MG PO TABS: 75.0000 mg | ORAL_TABLET | Freq: Every day | ORAL | Status: DC

## 2013-07-30 MED ORDER — METOPROLOL SUCCINATE ER 50 MG PO TB24: ORAL_TABLET | ORAL | Status: DC

## 2013-07-30 NOTE — Assessment & Plan Note (Signed)
Fasting lipids and LFTs will be drawn. I tested the medications if necessary. She is not on a statin at this time.

## 2013-07-30 NOTE — Progress Notes (Deleted)
Name: Stacy Douglas    DOB: 01-07-1951  Age: 63 y.o.  MR#: [id number]       PCP:  No primary provider on file.      Insurance: Payor: Pharmacist, hospital / Plan: BCBS PPO OUT OF STATE / Product Type: *No Product type* /   CC:    Chief Complaint  Patient presents with  . Coronary Artery Disease  . Hypertension    VS Filed Vitals:   07/30/13 1347  BP: 126/72  Pulse: 79  Height: 5\' 4"  (1.626 m)  Weight: 201 lb (91.173 kg)    Weights Current Weight  07/30/13 201 lb (91.173 kg)  02/04/13 195 lb (88.451 kg)  05/11/12 184 lb 6.4 oz (83.643 kg)    Blood Pressure  BP Readings from Last 3 Encounters:  07/30/13 126/72  02/04/13 131/85  05/11/12 118/68     Admit date:  (Not on file) Last encounter with RMR:  02/04/2013   Allergy Codeine  Current Outpatient Prescriptions  Medication Sig Dispense Refill  . aspirin EC 81 MG tablet Take 81 mg by mouth daily.      . clopidogrel (PLAVIX) 75 MG tablet Take 1 tablet (75 mg total) by mouth daily.  30 tablet  6  . metoprolol succinate (TOPROL-XL) 50 MG 24 hr tablet TAKE 1/2 TABLET BY MOUTH DAILY  30 tablet  5  . nitroGLYCERIN (NITROSTAT) 0.4 MG SL tablet Place 1 tablet (0.4 mg total) under the tongue every 5 (five) minutes as needed.  25 tablet  3   No current facility-administered medications for this visit.    Discontinued Meds:    Medications Discontinued During This Encounter  Medication Reason  . ramipril (ALTACE) 1.25 MG capsule Error    Patient Active Problem List   Diagnosis Date Noted  . Coronary atherosclerosis of native coronary artery 02/20/2011  . Edema 02/20/2011  . Mixed hyperlipidemia 02/20/2011  . Tobacco use disorder 02/20/2011    LABS No results found for this basename: na, k, cl, co2, glucose, bun, creatinine, calcium, gfrnonaa, gfraa   CMP     Component Value Date/Time   PROT 6.9 02/03/2013 1004   ALBUMIN 4.3 02/03/2013 1004   AST 16 02/03/2013 1004   ALT 13 02/03/2013 1004   ALKPHOS 68 02/03/2013  1004   BILITOT 0.4 02/03/2013 1004    No results found for this basename: wbc, hgb, hct, mcv, platelets    Lipid Panel     Component Value Date/Time   CHOL 170 02/03/2013 1004   TRIG 73 02/03/2013 1004   HDL 43 02/03/2013 1004   CHOLHDL 4.0 02/03/2013 1004   VLDL 15 02/03/2013 1004   LDLCALC 112* 02/03/2013 1004    ABG No results found for this basename: phart, pco2, pco2art, po2, po2art, hco3, tco2, acidbasedef, o2sat     No results found for this basename: TSH   BNP (last 3 results) No results found for this basename: PROBNP,  in the last 8760 hours Cardiac Panel (last 3 results) No results found for this basename: CKTOTAL, CKMB, TROPONINI, RELINDX,  in the last 72 hours  Iron/TIBC/Ferritin No results found for this basename: iron, tibc, ferritin     EKG Orders placed in visit on 05/11/12  . EKG 12-LEAD     Prior Assessment and Plan Problem List as of 07/30/2013     Cardiovascular and Mediastinum   Coronary atherosclerosis of native coronary artery   Last Assessment & Plan   02/04/2013 Office Visit Written 02/04/2013  2:44 PM by Jodelle GrossKathryn M Lawrence, NP     She is without cardiac complaint with the exception of a raspy cough at times. She continues to smoke, and is quitting slowly. I have asked her to stop the altace 1.25 for about a week to see if her cough is ACE related. If this does not change things she is to go back on it. I suspect it is related to her smoking.       Other   Edema   Last Assessment & Plan   02/20/2011 Office Visit Written 02/20/2011 10:29 AM by Jonelle SidleSamuel G McDowell, MD     Most likely related to venous insufficiency. We discussed sodium restriction and weight loss. She has also used compression hose on the left.    Mixed hyperlipidemia   Last Assessment & Plan   02/04/2013 Office Visit Edited 02/04/2013  2:47 PM by Jodelle GrossKathryn M Lawrence, NP     She has been off of her crestor for 6 months with elevation in TC to 170 from 137. LDL is up as well. She wishes  to continue on fish oil and diet restrictions. I have given her a copy of the labs to take home. She is advised to begin a walking program beginning at 15 minutes a day and working up to 30 minutes a day. She has a treadmill.    Tobacco use disorder   Last Assessment & Plan   02/04/2013 Office Visit Written 02/04/2013  2:46 PM by Jodelle GrossKathryn M Lawrence, NP      She continues to smoke but is quitting by cutting down. She plans to be completely stopped by December of 2014.        Imaging: No results found.

## 2013-07-30 NOTE — Progress Notes (Signed)
    HPI: [name] is a [age] year old patient of Dr. [name] we are following for ongoing assessment and management of CAD, with known history of MI in 2011, requiring stents to the circumflex artery.      She is continued DAPT. She was last seen in our office in October 2014, after which she had a stress Myoview completed one month earlier which was found to be negative for ischemia.      She unfortunately continued to smoke. She is complaining of a cough. I asked her to stop her alternates for week to see if her cough was age related. It did not she was to restart this. She is advised on increasing her exercise.   She comes today feeling very well, but irritated about some weight gain. She has stopped smoking, and has gained weight as a result. She is also not been as active over the winter. She is considering going back on the Atkins diet is concerned about eating green leafy vegetables while on Plavix. Otherwise she has been without complaint.       Allergies  Allergen Reactions  . Codeine Nausea And Vomiting    Current Outpatient Prescriptions  Medication Sig Dispense Refill  . aspirin EC 81 MG tablet Take 81 mg by mouth daily.      . clopidogrel (PLAVIX) 75 MG tablet Take 1 tablet (75 mg total) by mouth daily.  30 tablet  6  . metoprolol succinate (TOPROL-XL) 50 MG 24 hr tablet TAKE 1/2 TABLET BY MOUTH DAILY  30 tablet  6  . nitroGLYCERIN (NITROSTAT) 0.4 MG SL tablet Place 1 tablet (0.4 mg total) under the tongue every 5 (five) minutes as needed.  25 tablet  3   No current facility-administered medications for this visit.    Past Medical History  Diagnosis Date  . Coronary atherosclerosis of native coronary artery     BMS LAD 5/11, PTCA circumflex 6/11, LVEF 40%  . Mixed hyperlipidemia   . 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 Essential hypertension, benign     Past Surgical History  Procedure Laterality Date  . Cervical spine surgery    .  Appendectomy    . Right breast biopsy      ZOX:[id number]:Review of systems complete and found to be negative unless listed above  PHYSICAL EXAM BP 126/72  Pulse 79  Ht 5\' 4"  (1.626 m)  Wt 201 lb (91.173 kg)  BMI 34.48 kg/m2 General: Well developed, well nourished, in no acute distress Head: Eyes PERRLA, No xanthomas.   Normal cephalic and atramatic  Lungs: Clear bilaterally to auscultation and percussion. Heart: HRRR S1 S2, without MRG.  Pulses are 2+ & equal.            No carotid bruit. No JVD.  No abdominal bruits. No femoral bruits. Abdomen: Bowel sounds are positive, abdomen soft and non-tender without masses or                  Hernia's noted. Msk:  Back normal, normal gait. Normal strength and tone for age. Extremities: No clubbing, cyanosis or edema.  DP +1 Neuro: Alert and oriented X 3. Psych:  Good affect, responds appropriately     ASSESSMENT AND PLAN

## 2013-07-30 NOTE — Assessment & Plan Note (Signed)
Is congratulated on smoking cessation. She is advised to continue this healthy lifestyle.

## 2013-07-30 NOTE — Patient Instructions (Addendum)
.  Your physician recommends that you schedule a follow-up appointment in: 6 months with Dr Randa SpikeMcDowell You will receive a reminder letter two months in advance reminding you to call and schedule your appointment. If you don't receive this letter, please contact our office.  Your physician recommends that you return for lab work next week. Lipids, LFT's, BMET, CBC, TSH

## 2013-07-30 NOTE — Assessment & Plan Note (Signed)
From a cardiology standpoint, she is stable. She denies recurrent chest pain dyspnea on exertion or fatigue. She is unhappy with the weight gain which occurred over the winter which was less active. She is beginning an Atkins diet which was effective on the last time she tried to lose weight. She is concerned about eating a lot of leafy green vegetables taking Plavix. I told her that it was okay for her to eat the vegetables, but not excessively.  I have advised her to increase her exercise so that she will also be able to lose some weight and help with overall healthy lifestyle. She will continue on Plavix aspirin metoprolol.  [place]

## 2013-08-04 NOTE — Addendum Note (Signed)
Addended by: Thompson GrayerURHAM, Geralynn Capri C on: 08/04/2013 03:22 PM   Modules accepted: Orders

## 2013-09-09 ENCOUNTER — Encounter: Payer: Self-pay | Admitting: Physician Assistant

## 2013-09-09 ENCOUNTER — Ambulatory Visit (INDEPENDENT_AMBULATORY_CARE_PROVIDER_SITE_OTHER): Payer: BC Managed Care – PPO | Admitting: Physician Assistant

## 2013-09-09 VITALS — BP 129/77 | HR 72 | Temp 98.1°F | Ht 64.0 in | Wt 199.4 lb

## 2013-09-09 DIAGNOSIS — L309 Dermatitis, unspecified: Secondary | ICD-10-CM

## 2013-09-09 DIAGNOSIS — L259 Unspecified contact dermatitis, unspecified cause: Secondary | ICD-10-CM

## 2013-09-09 NOTE — Progress Notes (Signed)
Subjective:     Patient ID: Stacy Douglas, female   DOB: 1951-01-06, 63 y.o.   MRN: [id number]  HPI Pt with a red bump to the R hip, behind the R knee, and R ankle Concerned this may be shingles and here for review  Review of Systems Sl pain to the areas of the rash She denies any blistering or drainage from the sites No OTC meds used    Objective:   Physical Exam Solitary erythem papules noted to the above areas No vesicles noted No surrounding induration No TTP No drainage    Assessment:     Dermatitis    Plan:     Discussed with pt areas look more like insect bite Reviewed with pt nl presentation/appearance of shingles Pt to return immed if these progress Conservative tx F/U prn

## 2013-11-02 ENCOUNTER — Encounter (INDEPENDENT_AMBULATORY_CARE_PROVIDER_SITE_OTHER): Payer: Self-pay

## 2013-11-02 ENCOUNTER — Encounter: Payer: Self-pay | Admitting: Nurse Practitioner

## 2013-11-02 ENCOUNTER — Ambulatory Visit (INDEPENDENT_AMBULATORY_CARE_PROVIDER_SITE_OTHER): Payer: BC Managed Care – PPO | Admitting: Nurse Practitioner

## 2013-11-02 VITALS — BP 122/84 | HR 81 | Temp 98.1°F | Ht 64.0 in | Wt 201.2 lb

## 2013-11-02 DIAGNOSIS — Z23 Encounter for immunization: Secondary | ICD-10-CM

## 2013-11-02 DIAGNOSIS — F17200 Nicotine dependence, unspecified, uncomplicated: Secondary | ICD-10-CM

## 2013-11-02 DIAGNOSIS — I2510 Atherosclerotic heart disease of native coronary artery without angina pectoris: Secondary | ICD-10-CM

## 2013-11-02 DIAGNOSIS — Z6834 Body mass index (BMI) 34.0-34.9, adult: Secondary | ICD-10-CM

## 2013-11-02 DIAGNOSIS — Z9889 Other specified postprocedural states: Secondary | ICD-10-CM

## 2013-11-02 DIAGNOSIS — Z0000 Encounter for general adult medical examination without abnormal findings: Secondary | ICD-10-CM

## 2013-11-02 DIAGNOSIS — E785 Hyperlipidemia, unspecified: Secondary | ICD-10-CM

## 2013-11-02 DIAGNOSIS — Z713 Dietary counseling and surveillance: Secondary | ICD-10-CM

## 2013-11-02 NOTE — Patient Instructions (Signed)
Exercise to Lose Weight Exercise and a healthy diet may help you lose weight. Your doctor may suggest specific exercises. EXERCISE IDEAS AND TIPS  Choose low-cost things you enjoy doing, such as walking, bicycling, or exercising to workout videos.  Take stairs instead of the elevator.  Walk during your lunch break.  Park your car further away from work or school.  Go to a gym or an exercise class.  Start with 5 to 10 minutes of exercise each day. Build up to 30 minutes of exercise 4 to 6 days a week.  Wear shoes with good support and comfortable clothes.  Stretch before and after working out.  Work out until you breathe harder and your heart beats faster.  Drink extra water when you exercise.  Do not do so much that you hurt yourself, feel dizzy, or get very short of breath. Exercises that burn about 150 calories:  Running 1  miles in 15 minutes.  Playing volleyball for 45 to 60 minutes.  Washing and waxing a car for 45 to 60 minutes.  Playing touch football for 45 minutes.  Walking 1  miles in 35 minutes.  Pushing a stroller 1  miles in 30 minutes.  Playing basketball for 30 minutes.  Raking leaves for 30 minutes.  Bicycling 5 miles in 30 minutes.  Walking 2 miles in 30 minutes.  Dancing for 30 minutes.  Shoveling snow for 15 minutes.  Swimming laps for 20 minutes.  Walking up stairs for 15 minutes.  Bicycling 4 miles in 15 minutes.  Gardening for 30 to 45 minutes.  Jumping rope for 15 minutes.  Washing windows or floors for 45 to 60 minutes. Document Released: 05/18/2010 Document Revised: 07/08/2011 Document Reviewed: 05/18/2010 ExitCare Patient Information 2015 [employer]. This information is not intended to replace advice given to you by your health care provider. Make sure you discuss any questions you have with your health care provider.  

## 2013-11-02 NOTE — Progress Notes (Signed)
Subjective:    Patient ID: PHUNG KOTAS, female    DOB: 12/02/1950, 63 y.o.   MRN: [id number]  Patient here today for follow up of chronic medical problems.  Hyperlipidemia This is a chronic problem. The current episode started more than 1 year ago. The problem is controlled. Recent lipid tests were reviewed and are normal. Exacerbating diseases include obesity. She has no history of diabetes or hypothyroidism. Factors aggravating her hyperlipidemia include smoking. Pertinent negatives include no focal sensory loss or myalgias. She is currently on no antihyperlipidemic treatment (patient was on crestor but stopped taking on her own.). Compliance problems include adherence to diet and adherence to exercise.  Risk factors for coronary artery disease include post-menopausal, obesity and dyslipidemia.  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 Saw cardiologist in April 2015 Smoker Patient in down to 4 cigarettes a day.  Review of Systems  Constitutional: Negative.   HENT: Negative.   Respiratory: Negative.   Cardiovascular: Negative.   Gastrointestinal: Negative.   Genitourinary: Negative.   Musculoskeletal: Negative for myalgias.  Neurological: Negative.   Hematological: Bruises/bleeds easily.  Psychiatric/Behavioral: Negative.        Objective:   Physical Exam  Constitutional: She is oriented to person, place, and time. She appears well-developed and well-nourished.  HENT:  Nose: Nose normal.  Mouth/Throat: Oropharynx is clear and moist.  Eyes: EOM are normal.  Neck: Trachea normal, normal range of motion and full passive range of motion without pain. Neck supple. No JVD present. Carotid bruit is not present. No thyromegaly present.  Cardiovascular: Normal rate, regular rhythm, normal heart sounds and intact distal pulses.  Exam reveals no gallop and no friction  rub.   No murmur heard. Pulmonary/Chest: Effort normal and breath sounds normal.  Abdominal: Soft. Bowel sounds are normal. She exhibits no distension and no mass. There is no tenderness.  Musculoskeletal: Normal range of motion.  Lymphadenopathy:    She has no cervical adenopathy.  Neurological: She is alert and oriented to person, place, and time. She has normal reflexes.  Skin: Skin is warm and dry.  Psychiatric: She has a normal mood and affect. Her behavior is normal. Judgment and thought content normal.   BP 122/84  Pulse 81  Temp(Src) 98.1 F (36.7 C) (Oral)  Ht '5\' 4"'  (1.626 m)  Wt 201 lb 3.2 oz (91.264 kg)  BMI 34.52 kg/m2        Assessment & Plan:   1. Tobacco use disorder   2. Hyperlipidemia with target LDL less than 100   3. Atherosclerosis of native coronary artery of native heart without angina pectoris   4. Annual physical exam   5. History of breast lump/mass excision   6. BMI 34.0-34.9,adult   7. Weight loss counseling, encounter for     Orders Placed This Encounter  Procedures  . MM Digital Screening    Standing Status: Future     Number of Occurrences:      Standing Expiration Date: 01/04/2015    Scheduling Instructions:     [facility]    Order Specific Question:  Reason for Exam (SYMPTOM  OR DIAGNOSIS REQUIRED)    Answer:  screening    Order Specific Question:  Preferred imaging location?    Answer:  External  . Varicella-zoster vaccine subcutaneous  . Tdap vaccine greater than or equal to 7yo IM  . CMP14+EGFR  .  NMR, lipoprofile  . Thyroid Panel With TSH  . Vit D  25 hydroxy (rtn osteoporosis monitoring)   Td and zostavax today- will get pneumovax at next visit Labs pending Health maintenance reviewed Diet and exercise encouraged Continue all meds Follow up  In 3 months   [place], FNP

## 2013-11-03 LAB — SPECIMEN STATUS REPORT

## 2013-11-03 LAB — THYROID PANEL WITH TSH
FREE THYROXINE INDEX: 2.1 (ref 1.2–4.9)
T3 UPTAKE RATIO: 27 % (ref 24–39)
T4, Total: 7.9 ug/dL (ref 4.5–12.0)
TSH: 1.76 u[IU]/mL (ref 0.450–4.500)

## 2013-11-05 LAB — CMP14+EGFR
A/G RATIO: 2.2 (ref 1.1–2.5)
ALBUMIN: 4.6 g/dL (ref 3.6–4.8)
ALT: 15 IU/L (ref 0–32)
AST: 15 IU/L (ref 0–40)
Alkaline Phosphatase: 76 IU/L (ref 39–117)
BUN/Creatinine Ratio: 11 (ref 11–26)
BUN: 11 mg/dL (ref 8–27)
CALCIUM: 9.8 mg/dL (ref 8.7–10.3)
CO2: 25 mmol/L (ref 18–29)
CREATININE: 0.99 mg/dL (ref 0.57–1.00)
Chloride: 100 mmol/L (ref 97–108)
GFR calc Af Amer: 71 mL/min/{1.73_m2} (ref >59)
GFR, EST NON AFRICAN AMERICAN: 62 mL/min/{1.73_m2} (ref >59)
GLOBULIN, TOTAL: 2.1 g/dL (ref 1.5–4.5)
GLUCOSE: 84 mg/dL (ref 65–99)
Potassium: 4.4 mmol/L (ref 3.5–5.2)
Sodium: 141 mmol/L (ref 134–144)
TOTAL PROTEIN: 6.7 g/dL (ref 6.0–8.5)
Total Bilirubin: <0.2 mg/dL (ref 0.0–1.2)

## 2013-11-05 LAB — NMR, LIPOPROFILE
CHOLESTEROL: 168 mg/dL (ref 100–199)
HDL CHOLESTEROL BY NMR: 41 mg/dL (ref >39)
HDL PARTICLE NUMBER: 34 umol/L (ref >=30.5)
LDL Particle Number: 1083 nmol/L — ABNORMAL HIGH (ref <1000)
LDL Size: 21.7 nm (ref >20.5)
LDLC SERPL CALC-MCNC: 103 mg/dL — ABNORMAL HIGH (ref 0–99)
LP-IR Score: 59 — ABNORMAL HIGH (ref <=45)
SMALL LDL PARTICLE NUMBER: 205 nmol/L (ref <=527)
TRIGLYCERIDES BY NMR: 118 mg/dL (ref 0–149)

## 2013-11-05 LAB — VITAMIN D 25 HYDROXY (VIT D DEFICIENCY, FRACTURES): Vit D, 25-Hydroxy: 11 ng/mL — ABNORMAL LOW (ref 30.0–100.0)

## 2013-11-08 ENCOUNTER — Telehealth: Payer: Self-pay | Admitting: Nurse Practitioner

## 2013-11-08 NOTE — Telephone Encounter (Signed)
For refferals

## 2013-11-09 NOTE — Telephone Encounter (Signed)
Pt was informed that no referral was needed  As long as no problems , which she states she does not have. She was also informed that she  Would need to get most recent mammograms sent for comparison

## 2013-11-09 NOTE — Telephone Encounter (Signed)
[facility]MRC on cell   Pt can schedule her own mammogram, unless she is having a problem.

## 2013-11-10 ENCOUNTER — Encounter: Payer: Self-pay | Admitting: Nurse Practitioner

## 2013-11-22 ENCOUNTER — Telehealth: Payer: Self-pay | Admitting: Cardiology

## 2013-11-22 NOTE — Telephone Encounter (Signed)
Pt states that she just doesn't feel right. She says she is stressed and has tightness in her shoulders. Pt knows when her BP is up. BP running around 160/85. I asked pt for pulse and she is not around her BP machine. Pt states she feels worse when standing. Pt denies SOB, swelling, or chest pain. Pt has had heart attack before and she states she does not feel like that is what it is. Pt wants to know if she needs to take an extra metoprolol or start back on Ramipril. KL, NP stopped Ramipril back in October.   Please advise.

## 2013-11-22 NOTE — Telephone Encounter (Signed)
Made pt aware to track Bp. Pt agreed and will keep us informed

## 2013-11-22 NOTE — Telephone Encounter (Signed)
I have not seen her in the office since 2012. I reviewed Ms. Lawrence NPs last few notes. If she is under stress, this may also be contributing to an increase in her blood pressure. I notice that her blood pressure was nearly normal at a recent primary care office visit documented in EPIC. She was previously on Altace 1.25 mg daily, a very low-dose. Ms. Lyman BishopLawrence NP had some concerns about this contributing to a cough which is why it was stopped. For now would recommend that she track her blood pressure daily to see if her trend is actually staying up. If this is the case, she could probably go back on the Altace and make an office followup visit.

## 2013-11-22 NOTE — Telephone Encounter (Signed)
Please call patient back regarding elevated BP and Headache / tgs

## 2013-12-30 ENCOUNTER — Encounter: Payer: Self-pay | Admitting: Cardiology

## 2013-12-30 ENCOUNTER — Ambulatory Visit (INDEPENDENT_AMBULATORY_CARE_PROVIDER_SITE_OTHER): Payer: BC Managed Care – PPO | Admitting: Cardiology

## 2013-12-30 VITALS — BP 118/72 | HR 70 | Ht 64.0 in | Wt 202.0 lb

## 2013-12-30 DIAGNOSIS — E785 Hyperlipidemia, unspecified: Secondary | ICD-10-CM

## 2013-12-30 DIAGNOSIS — I2510 Atherosclerotic heart disease of native coronary artery without angina pectoris: Secondary | ICD-10-CM

## 2013-12-30 NOTE — Progress Notes (Signed)
    Clinical Summary Stacy Douglas is a 63 y.o.female last seen by Ms. Lawrence NP back in April, I have not seen her since 2012. She states that overall she has been doing well, no angina symptoms. She has been trying to lose weight, we discussed an exercise plan, some of the local gyms that are available, also a diet along the lines of the [employer].  Lab work from July showed BUN 11, creatinine 0.9, potassium 4.4, normal LFTs, cholesterol 168, triglycerides 118, LDL 103, HDL 41. She has not taken Crestor for last 6 months.  Exercise Myoview from April 2013 showed no diagnostic ST segment changes at 7 METs, hypertensive response with chest pain, perfusion imaging showing soft tissue attenuation without scar or ischemia, LVEF 86%.   Allergies  Allergen Reactions  . Codeine Nausea And Vomiting    Current Outpatient Prescriptions  Medication Sig Dispense Refill  . aspirin EC 81 MG tablet Take 81 mg by mouth daily.      . clopidogrel (PLAVIX) 75 MG tablet Take 1 tablet (75 mg total) by mouth daily.  30 tablet  6  . metoprolol succinate (TOPROL-XL) 50 MG 24 hr tablet TAKE 1/2 TABLET BY MOUTH DAILY  30 tablet  6  . nitroGLYCERIN (NITROSTAT) 0.4 MG SL tablet Place 1 tablet (0.4 mg total) under the tongue every 5 (five) minutes as needed.  25 tablet  3   No current facility-administered medications for this visit.    Past Medical History  Diagnosis Date  . Coronary atherosclerosis of native coronary artery     BMS LAD 5/11, PTCA circumflex 6/11, LVEF 40%  . Mixed hyperlipidemia   . 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 Essential hypertension, benign     Social History Stacy Douglas reports that she has been smoking Cigarettes.  She has been smoking about 0.00 packs per day for the past 30 years. She has never used smokeless tobacco. Stacy Douglas reports that she drinks alcohol.  Review of Systems No palpitations, dizziness, syncope. Other systems reviewed and  negative.  Physical Examination Filed Vitals:   12/30/13 0949  BP: 118/72  Pulse: 70   Filed Weights   12/30/13 0949  Weight: 202 lb (91.627 kg)    Overweight woman in no acute distress.  HEENT: Conjunctiva and lids normal, oropharynx with moist mucosa.  Neck: Supple, no elevated JVP or carotid bruits, no thyromegaly.  Lungs: Clear to auscultation, nonlabored.  Cardiac: Regular rate and rhythm, no significant murmur or gallop, no rub.  Abdomen: Soft, nontender, bowel sounds present, no bruits.  Skin: Warm and dry.  Extremities: No pitting edema, distal pulses full. There is trace left ankle edema.  Musculoskeletal: No kyphosis.  Neuropsychiatric: Alert and oriented x3, affect appropriate.   Problem List and Plan   Coronary atherosclerosis of native coronary artery Symptomatically stable on medical therapy. We discussed diet and exercise. Followup arranged in 6 months.  Hyperlipidemia with target LDL less than 100 Patient stopped Crestor, does not want to try a statin at this time. We discussed diet and exercise. Most recent LDL was 103.    Jonelle Sidle, M.D., F.A.C.C.

## 2013-12-30 NOTE — Assessment & Plan Note (Signed)
Patient stopped Crestor, does not want to try a statin at this time. We discussed diet and exercise. Most recent LDL was 103.

## 2013-12-30 NOTE — Patient Instructions (Signed)
Your physician wants you to follow-up in: 6 months You will receive a reminder letter in the mail two months in advance. If you don't receive a letter, please call our office to schedule the follow-up appointment.     Your physician recommends that you continue on your current medications as directed. Please refer to the Current Medication list given to you today.      Thank you for choosing [facility] !        

## 2013-12-30 NOTE — Assessment & Plan Note (Signed)
Symptomatically stable on medical therapy. We discussed diet and exercise. Followup arranged in 6 months.

## 2014-07-05 ENCOUNTER — Other Ambulatory Visit: Payer: Self-pay | Admitting: Adult Health

## 2014-07-18 ENCOUNTER — Other Ambulatory Visit: Payer: Self-pay | Admitting: Adult Health

## 2014-07-20 ENCOUNTER — Ambulatory Visit (INDEPENDENT_AMBULATORY_CARE_PROVIDER_SITE_OTHER): Payer: BLUE CROSS/BLUE SHIELD | Admitting: Cardiology

## 2014-07-20 ENCOUNTER — Encounter: Payer: Self-pay | Admitting: Cardiology

## 2014-07-20 VITALS — BP 118/72 | HR 75 | Ht 64.0 in | Wt 206.8 lb

## 2014-07-20 DIAGNOSIS — I2510 Atherosclerotic heart disease of native coronary artery without angina pectoris: Secondary | ICD-10-CM

## 2014-07-20 DIAGNOSIS — I10 Essential (primary) hypertension: Secondary | ICD-10-CM

## 2014-07-20 NOTE — Progress Notes (Signed)
Cardiology Office Note  Date: 07/20/2014   ID: Stacy Douglas, DOB [birth year], MRN [id number]  PCP: Bennie Pierini, FNP  Primary Cardiologist: Nona Dell, MD   Chief Complaint  Patient presents with  . Coronary Artery Disease  . Hypertension  . Hyperlipidemia    History of Present Illness: Stacy Douglas is a 64 y.o. female last seen in September 2015. She presents for a routine follow-up visit. 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 From a cardiac perspective she has done well, no angina symptoms or significant shortness of breath. She has gained weight, is trying to quit smoking as well. We talked about an exercise plan. Also reports having trouble with anxiety and worry about her job situation.  We reviewed her medications, there have been no significant changes. Follow-up tracing today is normal.  Her last lipid panel in the summer of last year per primary care showed improving LDL.     Past Medical History  Diagnosis Date  . Coronary atherosclerosis of native coronary artery     BMS LAD 5/11, PTCA circumflex 6/11, LVEF 40%  . Mixed hyperlipidemia   . 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 Essential hypertension, benign     Past Surgical History  Procedure Laterality Date  . Cervical spine surgery    . Appendectomy    . Right breast biopsy      Current Outpatient Prescriptions  Medication Sig Dispense Refill  . aspirin EC 81 MG tablet Take 81 mg by mouth daily.    . clopidogrel (PLAVIX) 75 MG tablet TAKE 1 TABLET BY MOUTH EVERY DAY 30 tablet 6  . metoprolol succinate (TOPROL-XL) 50 MG 24 hr tablet TAKE 1/2 TABLET BY MOUTH DAILY 30 tablet 6  . nitroGLYCERIN (NITROSTAT) 0.4 MG SL tablet Place 1 tablet (0.4 mg total) under the tongue every 5 (five) minutes as needed. 25 tablet 3   No current facility-administered medications for this visit.    Allergies:  Codeine   Social History: The patient  reports that she has  been smoking Cigarettes.  She has smoked for the past 30 years. She has never used smokeless tobacco. She reports that she drinks alcohol. She reports that she does not use illicit drugs.   ROS:  Please see the history of present illness. Otherwise, complete review of systems is positive for anxiety.  All other systems are reviewed and negative.   Physical Exam: VS:  BP 118/72 mmHg  Pulse 75  Ht  (1.626 m)  Wt 206 lb 12.8 oz (93.804 kg)  BMI 35.48 kg/m2  SpO2 95%, BMI Body mass index is 35.48 kg/(m^2).  Wt Readings from Last 3 Encounters:  07/20/14 206 lb 12.8 oz (93.804 kg)  12/30/13 202 lb (91.627 kg)  11/02/13 201 lb 3.2 oz (91.264 kg)     Overweight woman in no acute distress.  HEENT: Conjunctiva and lids normal, oropharynx with moist mucosa.  Neck: Supple, no elevated JVP or carotid bruits, no thyromegaly.  Lungs: Clear to auscultation, nonlabored.  Cardiac: Regular rate and rhythm, no significant murmur or gallop, no rub.  Abdomen: Soft, nontender, bowel sounds present, no bruits.  Skin: Warm and dry.  Extremities: No pitting edema, distal pulses full. There is trace left ankle edema.  Musculoskeletal: No kyphosis.  Neuropsychiatric: Alert and oriented x3, affect appropriate.   ECG: ECG is ordered today and reviewed showing normal sinus rhythm.   Recent Labwork: 11/02/2013: ALT 15;  AST 15; BUN 11; Creatinine 0.99; Potassium 4.4; Sodium 141; TSH 1.760     Component Value Date/Time   CHOL 168 11/02/2013 1428   TRIG 118 11/02/2013 1428   TRIG 73 02/03/2013 1004   HDL 41 11/02/2013 1428   HDL 43 02/03/2013 1004   CHOLHDL 4.0 02/03/2013 1004   VLDL 15 02/03/2013 1004   LDLCALC 103* 11/02/2013 1428   LDLCALC 112* 02/03/2013 1004    Other Studies Reviewed Today:  Exercise Myoview from April 2013 showed no diagnostic ST segment changes at 7 METs, hypertensive response with chest pain, perfusion imaging showing soft tissue attenuation without scar or  ischemia, LVEF 86%.  Assessment and Plan:  1. CAD status post BMS to the LAD and PTCA to the circumflex in 2011 as outlined above. LVEF normal by assessment in 2013 with no active ischemia at that time. We will continue medical therapy and observation. I encouraged her to pursue a diet and exercise plan.  2. Essential hypertension, blood pressure is normal today.  3. Reported anxiety and situational stress. I asked her to make sure that she speaks with her primary care provider about these issues.  Current medicines were reviewed with the patient today.   Orders Placed This Encounter  Procedures  . EKG 12-Lead    Disposition: FU with me in 6 months.   Signed, Jonelle SidleSamuel G. Korey Arroyo, MD, [facility] HuntingtonFACC 07/20/2014 2:10 PM    [facility] [address], [place], [place] [address] Phone: [phone]) [phone]; Fax: [phone]

## 2014-07-20 NOTE — Patient Instructions (Signed)
Your physician wants you to follow-up in: 6 months with Dr. McDowell You will receive a reminder letter in the mail two months in advance. If you don't receive a letter, please call our office to schedule the follow-up appointment.  Your physician recommends that you continue on your current medications as directed. Please refer to the Current Medication list given to you today.  Thank you for choosing [facility]!!    

## 2014-08-18 ENCOUNTER — Other Ambulatory Visit: Payer: Self-pay | Admitting: Adult Health

## 2014-11-15 ENCOUNTER — Telehealth: Payer: Self-pay | Admitting: *Deleted

## 2014-11-15 ENCOUNTER — Encounter (HOSPITAL_COMMUNITY): Payer: Self-pay

## 2014-11-15 ENCOUNTER — Inpatient Hospital Stay (HOSPITAL_COMMUNITY)
Admission: EM | Admit: 2014-11-15 | Discharge: 2014-11-17 | DRG: 247 | Disposition: A | Discharge status: Home / Self Care | Payer: BLUE CROSS/BLUE SHIELD | Attending: Cardiology | Admitting: Cardiology

## 2014-11-15 ENCOUNTER — Other Ambulatory Visit (HOSPITAL_COMMUNITY): Payer: Self-pay

## 2014-11-15 ENCOUNTER — Emergency Department (HOSPITAL_COMMUNITY): Payer: BLUE CROSS/BLUE SHIELD

## 2014-11-15 ENCOUNTER — Other Ambulatory Visit: Payer: Self-pay

## 2014-11-15 DIAGNOSIS — F17200 Nicotine dependence, unspecified, uncomplicated: Secondary | ICD-10-CM | POA: Diagnosis present

## 2014-11-15 DIAGNOSIS — E669 Obesity, unspecified: Secondary | ICD-10-CM | POA: Diagnosis not present

## 2014-11-15 DIAGNOSIS — E785 Hyperlipidemia, unspecified: Secondary | ICD-10-CM

## 2014-11-15 DIAGNOSIS — Z955 Presence of coronary angioplasty implant and graft: Secondary | ICD-10-CM | POA: Diagnosis not present

## 2014-11-15 DIAGNOSIS — I10 Essential (primary) hypertension: Secondary | ICD-10-CM | POA: Diagnosis not present

## 2014-11-15 DIAGNOSIS — Z885 Allergy status to narcotic agent status: Secondary | ICD-10-CM | POA: Diagnosis not present

## 2014-11-15 DIAGNOSIS — E782 Mixed hyperlipidemia: Secondary | ICD-10-CM | POA: Diagnosis not present

## 2014-11-15 DIAGNOSIS — Z79899 Other long term (current) drug therapy: Secondary | ICD-10-CM | POA: Diagnosis not present

## 2014-11-15 DIAGNOSIS — Z8249 Family history of ischemic heart disease and other diseases of the circulatory system: Secondary | ICD-10-CM | POA: Diagnosis not present

## 2014-11-15 DIAGNOSIS — Z7982 Long term (current) use of aspirin: Secondary | ICD-10-CM

## 2014-11-15 DIAGNOSIS — F17210 Nicotine dependence, cigarettes, uncomplicated: Secondary | ICD-10-CM | POA: Diagnosis not present

## 2014-11-15 DIAGNOSIS — Z7902 Long term (current) use of antithrombotics/antiplatelets: Secondary | ICD-10-CM

## 2014-11-15 DIAGNOSIS — I2510 Atherosclerotic heart disease of native coronary artery without angina pectoris: Secondary | ICD-10-CM | POA: Diagnosis present

## 2014-11-15 DIAGNOSIS — I252 Old myocardial infarction: Secondary | ICD-10-CM | POA: Diagnosis not present

## 2014-11-15 DIAGNOSIS — R079 Chest pain, unspecified: Secondary | ICD-10-CM

## 2014-11-15 DIAGNOSIS — I25110 Atherosclerotic heart disease of native coronary artery with unstable angina pectoris: Secondary | ICD-10-CM | POA: Diagnosis not present

## 2014-11-15 DIAGNOSIS — Z6835 Body mass index (BMI) 35.0-35.9, adult: Secondary | ICD-10-CM | POA: Diagnosis not present

## 2014-11-15 DIAGNOSIS — I200 Unstable angina: Secondary | ICD-10-CM

## 2014-11-15 HISTORY — DX: Obesity, unspecified: E66.9

## 2014-11-15 HISTORY — DX: Other specified health status: Z78.9

## 2014-11-15 HISTORY — DX: Essential (primary) hypertension: I10

## 2014-11-15 HISTORY — DX: Tobacco use: Z72.0

## 2014-11-15 LAB — CBC WITH DIFFERENTIAL/PLATELET
BASOS PCT: 1 % (ref 0–1)
Basophils Absolute: 0 10*3/uL (ref 0.0–0.1)
EOS ABS: 0.1 10*3/uL (ref 0.0–0.7)
EOS PCT: 3 % (ref 0–5)
HCT: 41.2 % (ref 36.0–46.0)
HEMOGLOBIN: 14.1 g/dL (ref 12.0–15.0)
LYMPHS PCT: 30 % (ref 12–46)
Lymphs Abs: 1.7 10*3/uL (ref 0.7–4.0)
MCH: 32.5 pg (ref 26.0–34.0)
MCHC: 34.2 g/dL (ref 30.0–36.0)
MCV: 94.9 fL (ref 78.0–100.0)
MONO ABS: 0.5 10*3/uL (ref 0.1–1.0)
Monocytes Relative: 9 % (ref 3–12)
NEUTROS ABS: 3.2 10*3/uL (ref 1.7–7.7)
NEUTROS PCT: 57 % (ref 43–77)
Platelets: 256 10*3/uL (ref 150–400)
RBC: 4.34 MIL/uL (ref 3.87–5.11)
RDW: 13.3 % (ref 11.5–15.5)
WBC: 5.6 10*3/uL (ref 4.0–10.5)

## 2014-11-15 LAB — BASIC METABOLIC PANEL
Anion gap: 8 (ref 5–15)
BUN: 21 mg/dL — AB (ref 6–20)
CHLORIDE: 106 mmol/L (ref 101–111)
CO2: 25 mmol/L (ref 22–32)
Calcium: 8.5 mg/dL — ABNORMAL LOW (ref 8.9–10.3)
Creatinine, Ser: 0.94 mg/dL (ref 0.44–1.00)
Glucose, Bld: 106 mg/dL — ABNORMAL HIGH (ref 65–99)
POTASSIUM: 4 mmol/L (ref 3.5–5.1)
Sodium: 139 mmol/L (ref 135–145)

## 2014-11-15 LAB — PLATELET INHIBITION P2Y12: Platelet Function  P2Y12: 244 [PRU] (ref 194–418)

## 2014-11-15 LAB — HEPATIC FUNCTION PANEL
ALK PHOS: 63 U/L (ref 38–126)
ALT: 17 U/L (ref 14–54)
AST: 19 U/L (ref 15–41)
Albumin: 3.8 g/dL (ref 3.5–5.0)
BILIRUBIN DIRECT: 0.1 mg/dL (ref 0.1–0.5)
BILIRUBIN TOTAL: 0.6 mg/dL (ref 0.3–1.2)
Indirect Bilirubin: 0.5 mg/dL (ref 0.3–0.9)
TOTAL PROTEIN: 6.4 g/dL — AB (ref 6.5–8.1)

## 2014-11-15 LAB — MRSA PCR SCREENING: MRSA BY PCR: NEGATIVE

## 2014-11-15 LAB — T4, FREE: Free T4: 0.99 ng/dL (ref 0.61–1.12)

## 2014-11-15 LAB — TROPONIN I
Troponin I: <0.03 ng/mL (ref <0.031)
Troponin I: <0.03 ng/mL (ref <0.031)

## 2014-11-15 LAB — PROTIME-INR
INR: 1.05 (ref 0.00–1.49)
Prothrombin Time: 13.9 seconds (ref 11.6–15.2)

## 2014-11-15 LAB — TSH: TSH: 1.366 u[IU]/mL (ref 0.350–4.500)

## 2014-11-15 MED ORDER — ONDANSETRON HCL 4 MG/2ML IJ SOLN
4.0000 mg | Freq: Once | INTRAMUSCULAR | Status: AC
  Administered 2014-11-15: 4 mg via INTRAMUSCULAR
  Filled 2014-11-15: qty 2

## 2014-11-15 MED ORDER — ASPIRIN 81 MG PO CHEW
81.0000 mg | CHEWABLE_TABLET | ORAL | Status: AC
  Administered 2014-11-16: 81 mg via ORAL
  Filled 2014-11-15: qty 1

## 2014-11-15 MED ORDER — CLOPIDOGREL BISULFATE 75 MG PO TABS
75.0000 mg | ORAL_TABLET | Freq: Every day | ORAL | Status: DC
  Administered 2014-11-15: 75 mg via ORAL
  Filled 2014-11-15: qty 1

## 2014-11-15 MED ORDER — HEPARIN (PORCINE) IN NACL 100-0.45 UNIT/ML-% IJ SOLN
1050.0000 [IU]/h | INTRAMUSCULAR | Status: DC
  Administered 2014-11-15: 900 [IU]/h via INTRAVENOUS
  Filled 2014-11-15: qty 250

## 2014-11-15 MED ORDER — NITROGLYCERIN 2 % TD OINT
1.0000 [in_us] | TOPICAL_OINTMENT | Freq: Once | TRANSDERMAL | Status: AC
Start: 2014-11-15 — End: 2014-11-15
  Administered 2014-11-15: 1 [in_us] via TOPICAL
  Filled 2014-11-15: qty 1

## 2014-11-15 MED ORDER — PROMETHAZINE HCL 25 MG/ML IJ SOLN
INTRAMUSCULAR | Status: AC
  Administered 2014-11-15: 12.5 mg via INTRAVENOUS
  Filled 2014-11-15: qty 1

## 2014-11-15 MED ORDER — SODIUM CHLORIDE 0.9 % IV SOLN: 250.0000 mL | INTRAVENOUS | Status: DC | PRN

## 2014-11-15 MED ORDER — MORPHINE SULFATE 4 MG/ML IJ SOLN
4.0000 mg | Freq: Once | INTRAMUSCULAR | Status: AC
  Administered 2014-11-15: 4 mg via INTRAVENOUS
  Filled 2014-11-15: qty 1

## 2014-11-15 MED ORDER — ASPIRIN EC 81 MG PO TBEC
81.0000 mg | DELAYED_RELEASE_TABLET | Freq: Every day | ORAL | Status: DC
  Administered 2014-11-17: 81 mg via ORAL
  Filled 2014-11-15: qty 1

## 2014-11-15 MED ORDER — HEPARIN BOLUS VIA INFUSION
3500.0000 [IU] | Freq: Once | INTRAVENOUS | Status: AC
  Administered 2014-11-15: 3500 [IU] via INTRAVENOUS
  Filled 2014-11-15: qty 3500

## 2014-11-15 MED ORDER — SODIUM CHLORIDE 0.9 % WEIGHT BASED INFUSION: 3.0000 mL/kg/h | INTRAVENOUS | Status: DC

## 2014-11-15 MED ORDER — ATORVASTATIN CALCIUM 40 MG PO TABS: 40.0000 mg | ORAL_TABLET | Freq: Every day | ORAL | Status: DC

## 2014-11-15 MED ORDER — ONDANSETRON HCL 4 MG/2ML IJ SOLN
4.0000 mg | Freq: Four times a day (QID) | INTRAMUSCULAR | Status: DC | PRN
  Administered 2014-11-15: 4 mg via INTRAVENOUS
  Filled 2014-11-15: qty 2

## 2014-11-15 MED ORDER — SODIUM CHLORIDE 0.9 % IJ SOLN: 3.0000 mL | INTRAMUSCULAR | Status: DC | PRN

## 2014-11-15 MED ORDER — PROMETHAZINE HCL 25 MG/ML IJ SOLN
12.5000 mg | Freq: Two times a day (BID) | INTRAMUSCULAR | Status: DC | PRN
  Administered 2014-11-15 – 2014-11-16 (×2): 12.5 mg via INTRAVENOUS
  Filled 2014-11-15 (×2): qty 1

## 2014-11-15 MED ORDER — SODIUM CHLORIDE 0.9 % IJ SOLN
3.0000 mL | Freq: Two times a day (BID) | INTRAMUSCULAR | Status: DC
  Administered 2014-11-16: 3 mL via INTRAVENOUS

## 2014-11-15 MED ORDER — METOPROLOL SUCCINATE ER 25 MG PO TB24
25.0000 mg | ORAL_TABLET | Freq: Every day | ORAL | Status: DC
  Administered 2014-11-16 – 2014-11-17 (×2): 25 mg via ORAL
  Filled 2014-11-15 (×2): qty 1

## 2014-11-15 MED ORDER — ACETAMINOPHEN 325 MG PO TABS: 650.0000 mg | ORAL_TABLET | ORAL | Status: DC | PRN

## 2014-11-15 MED ORDER — NITROGLYCERIN 0.4 MG SL SUBL
0.4000 mg | SUBLINGUAL_TABLET | SUBLINGUAL | Status: DC | PRN
  Filled 2014-11-15: qty 1

## 2014-11-15 MED ORDER — NITROGLYCERIN IN D5W 200-5 MCG/ML-% IV SOLN
2.0000 ug/min | INTRAVENOUS | Status: DC
  Administered 2014-11-15: 5 ug/min via INTRAVENOUS
  Filled 2014-11-15: qty 250

## 2014-11-15 MED ORDER — SODIUM CHLORIDE 0.9 % IV BOLUS (SEPSIS)
500.0000 mL | Freq: Once | INTRAVENOUS | Status: AC
  Administered 2014-11-15: 500 mL via INTRAVENOUS

## 2014-11-15 MED ORDER — SODIUM CHLORIDE 0.9 % WEIGHT BASED INFUSION: 1.0000 mL/kg/h | INTRAVENOUS | Status: DC

## 2014-11-15 NOTE — ED Notes (Signed)
Ems reports pt c/o left sided chest pain since last night.  History of MI 5 years ago.  EMS administered 1 nitro and pain decreased from 5 to 2.  Pt became nauseated after 1 nitro.  Pt has also had  baby asa and has IV in left hand.

## 2014-11-15 NOTE — ED Notes (Addendum)
Pt c/o nausea and dry heaving.  States her pain increased to a 4/10.  Paging cardiology pa. Repeating EKG.

## 2014-11-15 NOTE — ED Provider Notes (Signed)
CSN: [id number]     Arrival date & time 11/15/14  0903 History  This chart was scribed for Bethann BerkshireJoseph Coty Larsh, MD by Marica OtterNusrat Rahman, ED Scribe. This patient was seen in room [facility] and the patient's care was started at 9:46 AM.   Chief Complaint  Patient presents with  . Chest Pain    Patient is a 64 y.o. female presenting with chest pain. The history is provided by the patient. No language interpreter was used.  Chest Pain Pain location:  L chest Pain radiates to:  Does not radiate Pain severity:  Mild Onset quality:  Sudden Duration:  1 day Timing:  Intermittent Progression:  Improving Chronicity:  Recurrent Relieved by:  Nitroglycerin and aspirin Associated symptoms: no abdominal pain, no back pain, no cough, no fatigue, no fever, no headache, no nausea and no shortness of breath   Risk factors: aortic disease, high cholesterol and hypertension   Risk factors: not female    PCP: Bennie PieriniMARTIN,MARY MARGARET, FNP HPI Comments: Stacy Douglas is a 64 y.o. female, brought in via ambulance, with PMHx noted below including MI (2011), essential HTN,  coronary atherosclerosis of native coronary artery, and cardiac cath completed in 2011 which showed three blockages, who presents to the Emergency Department complaining of atraumatic, sudden onset, worsening, intermittent left sided chest pain originally onset last night and worsening this morning. EMS administered 1 nitro and 324 mg ASA which reduced pt's pain from 5/10 to 2/10. Pt reports some nausea after 1 dose of nitro, however, the Sx have now resolved. Pt reports she took her aspirin 81mg  and toprol-xl this morning prior to calling EMS. Pt denies SOB, nausea, or any other Sx at this time.   Past Medical History  Diagnosis Date  . Coronary atherosclerosis of native coronary artery     BMS LAD 5/11, PTCA circumflex 6/11, LVEF 40%  . Mixed hyperlipidemia   . 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 Essential hypertension, benign     Past Surgical History  Procedure Laterality Date  . Cervical spine surgery    . Appendectomy    . Right breast biopsy    . Coronary angioplasty with stent placement     Family History  Problem Relation Age of Onset  . Coronary artery disease Father     MI age 62   History  Substance Use Topics  . Smoking status: Current Every Day Smoker -- 30 years    Types: Cigarettes  . Smokeless tobacco: Never Used     Comment: 3 ciggs per day  . Alcohol Use: 0.0 oz/week    0 Standard drinks or equivalent per week     Comment: Occasionally   OB History    No data available     Review of Systems  Constitutional: Negative for fever, appetite change and fatigue.  HENT: Negative for congestion, ear discharge and sinus pressure.   Eyes: Negative for discharge.  Respiratory: Negative for cough and shortness of breath.   Cardiovascular: Positive for chest pain.  Gastrointestinal: Negative for nausea, abdominal pain and diarrhea.  Genitourinary: Negative for frequency and hematuria.  Musculoskeletal: Negative for back pain.  Skin: Negative for rash.  Neurological: Negative for seizures and headaches.  Psychiatric/Behavioral: Negative for hallucinations.      Allergies  Codeine  Home Medications   Prior to Admission medications   Medication Sig Start Date End Date Taking? Authorizing Provider  aspirin EC 81 MG tablet Take 81 mg by mouth  daily.   Yes Historical Provider, MD  clopidogrel (PLAVIX) 75 MG tablet TAKE 1 TABLET BY MOUTH EVERY DAY Patient taking differently: TAKE 1 TABLET BY MOUTH AT BEDTIME. 07/05/14  Yes Jonelle Sidle, MD  metoprolol succinate (TOPROL-XL) 50 MG 24 hr tablet TAKE 1/2 TABLET BY MOUTH EVERY DAY 08/18/14  Yes Jonelle Sidle, MD  nitroGLYCERIN (NITROSTAT) 0.4 MG SL tablet Place 1 tablet (0.4 mg total) under the tongue every 5 (five) minutes as needed. 05/11/12  Yes Jodelle Gross, NP   Triage Vitals: BP 103/60 mmHg  Pulse 67  Temp(Src) 98.1 F  (36.7 C) (Oral)  Resp 14  Ht  (1.626 m)  Wt 206 lb (93.441 kg)  BMI 35.34 kg/m2  SpO2 99% Physical Exam  Constitutional: She is oriented to person, place, and time. She appears well-developed.  HENT:  Head: Normocephalic.  Eyes: Conjunctivae and EOM are normal. No scleral icterus.  Neck: Neck supple. No thyromegaly present.  Cardiovascular: Normal rate and regular rhythm.  Exam reveals no gallop and no friction rub.   No murmur heard. minor left anterior chest pain   Pulmonary/Chest: No stridor. She has no wheezes. She has no rales. She exhibits no tenderness.  Abdominal: She exhibits no distension. There is no tenderness. There is no rebound.  Musculoskeletal: Normal range of motion. She exhibits no edema.  Lymphadenopathy:    She has no cervical adenopathy.  Neurological: She is oriented to person, place, and time. She exhibits normal muscle tone. Coordination normal.  Skin: No rash noted. No erythema.  Psychiatric: She has a normal mood and affect. Her behavior is normal.    ED Course  Procedures (including critical care time) DIAGNOSTIC STUDIES: Oxygen Saturation is 99% on RA, nl by my interpretation.    COORDINATION OF CARE: 9:49 AM-Discussed treatment plan with pt at bedside and pt agreed to plan.   Labs Review Labs Reviewed  CBC WITH DIFFERENTIAL/PLATELET  BASIC METABOLIC PANEL  TROPONIN I    Imaging Review Dg Chest Portable 1 View  11/15/2014   CLINICAL DATA:  Chest pain  EXAM: PORTABLE CHEST - 1 VIEW  COMPARISON:  07/09/11  FINDINGS: Cardiomediastinal silhouette is stable. Study is suboptimal due to patient's large body habitus. No acute infiltrate or pulmonary edema. Mild basilar atelectasis.  IMPRESSION: No acute infiltrate or pulmonary edema.  Mild basilar atelectasis.   Electronically Signed   By: Natasha Mead M.D.   On: 11/15/2014 09:22     EKG Interpretation   Date/Time:  Tuesday November 15 2014 09:06:15 EDT Ventricular Rate:  71 PR Interval:  170 QRS  Duration: 81 QT Interval:  375 QTC Calculation: 407 R Axis:   68 Text Interpretation:  Sinus rhythm Low voltage, precordial leads Confirmed  by Monya Kozakiewicz  MD, Navie Lamoreaux ([id number]) on 11/15/2014 9:44:54 AM      MDM   Final diagnoses:  None    Chest pain possible cardiac pain.  Pt seen by cardiology and she [facility]   Bethann Berkshire, MD 11/15/14 1519

## 2014-11-15 NOTE — ED Notes (Signed)
Pt vomiting.  Notified dr. Estell HarpinZammit.  Orders received.  Chest pain still a 2/10.

## 2014-11-15 NOTE — Telephone Encounter (Signed)
Pt says she has been experiencing chest discomfort, arm tingling since last night, sweats this AM BP/HR WNL according to pt, pt says she experienced same symptoms prior to last episode. Pt will report to [facility] for evaluation. Denies taking Nitroglycerin and would rather not. Pt says daughter in law does not feel comfortable driving pt to ED and pt is to anxious/upset to drive herself, phoned EMS and stayed on phone with pt until EMS arrived. Requested that EMS take pt to [facility]. Will forward to provider as FYI.

## 2014-11-15 NOTE — Progress Notes (Signed)
ANTICOAGULATION CONSULT NOTE - Initial Consult  Pharmacy Consult for heparin Indication: chest pain/ACS  Allergies  Allergen Reactions  . Codeine Nausea And Vomiting    Patient Measurements: Height: 5\' 4"  (162.6 cm) Weight: 206 lb (93.441 kg) IBW/kg (Calculated) : 54.7 Heparin Dosing Weight: 75.9kg  Vital Signs: Temp: 98.1 F (36.7 C) (07/19 0909) Temp Source: Oral (07/19 0909) BP: 111/63 mmHg (07/19 1632) Pulse Rate: 86 (07/19 1632)  Labs:  Recent Labs  11/15/14 0920 11/15/14 1156  HGB 14.1  --   HCT 41.2  --   PLT 256  --   LABPROT 13.9  --   INR 1.05  --   CREATININE 0.94  --   TROPONINI <0.03 <0.03    Estimated Creatinine Clearance: 68.8 mL/min (by C-G formula based on Cr of 0.94).   Medical History: Past Medical History  Diagnosis Date  . Coronary atherosclerosis of native coronary artery     a. VF arrest in [place] s/p BMS LAD 5/11, PTCA circumflex 6/11, LVEF 40%.  . Mixed hyperlipidemia   . 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 Essential hypertension, benign   . Tobacco abuse   . Obesity     Assessment: [age] yof with hx of CAD w/ Vfib arrest, s/p BMS to LAD, PTCA of Cx in 09/2009 admitted with CP. Developed recurrent pain/vomiting. EKG - no acute changes. No rush to cath per cards - [facility]. CBC wnl, no bleed documented.  Goal of Therapy:  INR 2-3 Monitor platelets by anticoagulation protocol: Yes   Plan:  Heparin 3500 unit bolus Heparin @ 900 units/h 6h HL Daily HL/CBC Mon s/sx bleeding  Babs BertinHaley Arlanda Shiplett, PharmD Clinical Pharmacist Pager [phone] 11/15/2014 6:29 PM

## 2014-11-15 NOTE — Progress Notes (Signed)
Called by nursing that patient developed recurrent pain and vomiting. Received SL NTG and Zofran just a few minutes ago. D/w Dr. [name]. Will start low dose NTG drip and upgrade bed to stepdown. BP may be prohibitive of aggressive titration. Since EKG does not show any acute changes and troponins have been negative, do not need to rush to cath but hope to get her pain free with these measures. Will also try low dose phenergan instead of Zofran for nausea/vomiting. [facility] and bed control aware.   Dayna Dunn PA-C

## 2014-11-15 NOTE — H&P (Signed)
History and Physical  Patient ID: Stacy Douglas MRN: [id number], DOB: Oct 11, 1950 Date of Encounter: 11/15/2014, 3:24 PM Primary Physician: Bennie Pierini, FNP Primary Cardiologist: Dr. 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 She is on ASA and Plavix chronically and reports med compliance - takes Plavix QHS. She is not on a statin because she said she has attempted to manage her cholesterol by diet alone. Last stress test 06/2009 was normal. She was seen in March of this year at which time she was doing well. She has had periodic chest pains on and off over the last year but said these were typically described as a brief tightness in the setting of stress. However, last night she began to notice a different kind of pain. After she got home from work and was winding down for the evening, doing routine tasks, she noticed intermittent chest tightness. She didn't think much of it and went on to bed. Around 3:40am the chest pain woke her up, this time associated with left posterior shoulder pain and numbness similar to her event in [place] in 2011. Symptoms did not improve or worsen with movement, palpitation or inspiration. She also felt diaphoretic. The discomfort was intermittent. She also noticed left arm tingling, which was not present with her prior angina. Due to symptoms she called the office this morning and decided to call 911. She received 324mg  ASA and 1 SL NTG. The NTG eased pain slightly but it did not go away completely. The pain subsided with 4mg  of morphine then returned, requiring an additional dose. Subsequent to the morphine she had vomiting which improved with Zofran. She  is currently chest pain free. BP was a little soft in the ER so she received normal saline bolus and NTG paste was removed. Troponins neg x 2. CBC WNL. BMET with Cr 0.94, glucose 106. CXR with mild basilar atx, otherwise nonacute.  Past Medical History  Diagnosis Date  . Coronary atherosclerosis of native coronary artery     a. VF arrest in [place] s/p BMS LAD 5/11, PTCA circumflex 6/11, LVEF 40%.  . Mixed hyperlipidemia   . 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 Essential hypertension, benign   . Tobacco abuse   . Obesity      Surgical History:  Past Surgical History  Procedure Laterality Date  . Cervical spine surgery    . Appendectomy    . Right breast biopsy    . Coronary angioplasty with stent placement       Home Meds: Prior to Admission medications   Medication Sig Start Date End Date Taking? Authorizing Provider  aspirin EC 81 MG tablet Take 81 mg by mouth daily.   Yes Historical Provider, MD  clopidogrel (PLAVIX) 75 MG tablet TAKE 1 TABLET BY MOUTH EVERY DAY Patient taking differently: TAKE 1 TABLET BY MOUTH AT BEDTIME. 07/05/14  Yes Jonelle Sidle, MD  metoprolol succinate (TOPROL-XL) 50 MG 24 hr tablet TAKE 1/2 TABLET BY MOUTH EVERY DAY 08/18/14  Yes Jonelle Sidle, MD  nitroGLYCERIN (NITROSTAT) 0.4 MG SL tablet Place 1 tablet (0.4 mg total) under the tongue  every 5 (five) minutes as needed. 05/11/12  Yes Jodelle GrossKathryn M Lawrence, NP    Allergies:  Allergies  Allergen Reactions  . Codeine Nausea And Vomiting    History   Social History  . Marital Status: Divorced    Spouse Name: N/A  . Number of Children: N/A  . Years of Education: N/A   Occupational History  . [employer]    Social History Main Topics  . Smoking status: Current Every Day Smoker -- 30 years    Types: Cigarettes  . Smokeless tobacco: Never Used     Comment: 3 ciggs per day  . Alcohol Use: No  . Drug Use: No  . Sexual Activity: Not on file   Other Topics Concern  .  Not on file   Social History Narrative     Family History  Problem Relation Age of Onset  . Coronary artery disease Father     MI age 62    Review of Systems: No recent bleeding. No syncope or palpitations. She has minimal trace edema after sitting for long periods of a time, no recent change. All other systems reviewed and are otherwise negative except as noted above.  Labs:   Lab Results  Component Value Date   WBC 5.6 11/15/2014   HGB 14.1 11/15/2014   HCT 41.2 11/15/2014   MCV 94.9 11/15/2014   PLT 256 11/15/2014     Recent Labs Lab 11/15/14 0920  NA 139  K 4.0  CL 106  CO2 25  BUN 21*  CREATININE 0.94  CALCIUM 8.5*  GLUCOSE 106*    Recent Labs  11/15/14 0920 11/15/14 1156  TROPONINI <0.03 <0.03   Lab Results  Component Value Date   CHOL 168 11/02/2013   HDL 41 11/02/2013   LDLCALC 103* 11/02/2013   TRIG 118 11/02/2013   Radiology/Studies:  Dg Chest Portable 1 View  11/15/2014   CLINICAL DATA:  Chest pain  EXAM: PORTABLE CHEST - 1 VIEW  COMPARISON:  07/09/11  FINDINGS: Cardiomediastinal silhouette is stable. Study is suboptimal due to patient's large body habitus. No acute infiltrate or pulmonary edema. Mild basilar atelectasis.  IMPRESSION: No acute infiltrate or pulmonary edema.  Mild basilar atelectasis.   Electronically Signed   By: Natasha MeadLiviu  Pop M.D.   On: 11/15/2014 09:22   Wt Readings from Last 3 Encounters:  11/15/14 206 lb (93.441 kg)  07/20/14 206 lb 12.8 oz (93.804 kg)  12/30/13 202 lb (91.627 kg)    EKG: 11/15/14: NSR 71bpm, somewhat low voltage precordial leads but no acute ST-T changes  Physical Exam: Blood pressure 88/57, pulse 68, temperature 98.1 F (36.7 C), temperature source Oral, resp. rate 13, height 5\' 4"  (1.626 m), weight 206 lb (93.441 kg), SpO2 99 %. General: Well developed, well nourished WF in no acute distress. Head: Normocephalic, atraumatic, sclera non-icteric, no xanthomas, nares are without discharge.  Neck:  Negative for carotid bruits. JVD not elevated. Lungs: Clear bilaterally to auscultation without wheezes, rales, or rhonchi. Breathing is unlabored. Heart: RRR with S1 S2. No murmurs, rubs, or gallops appreciated. Abdomen: Soft, non-tender, non-distended with normoactive bowel sounds. No hepatomegaly. No rebound/guarding. No obvious abdominal masses. Msk:  Strength and tone appear normal for age. Extremities: No clubbing or cyanosis. No edema.  Distal pedal pulses are 2+ and equal bilaterally. Neuro: Alert and oriented X 3. No focal deficit. No facial asymmetry. Moves all extremities spontaneously. Psych:  Responds to questions appropriately with a normal affect.    ASSESSMENT AND PLAN:  1. Chest pain with features concerning for unstable angina, with history of CAD s/p PCI as above - although there is no objective evidence of ischemia thus far, the patient is concerned because symptoms mirror that which occurred at the time of her cardiac event in 2011. She also reports that EKG was fairly unrevealing leading up to that as well. Different modalities of evaluation were discussed including risks and benefits. At this time she elects to proceed with cardiac catheterization for definitive evaluation. Will transfer to [facility]. Cycle enzymes. Start heparin per pharmacy. Continue ASA and Plavix and check P2Y12. Plan cath in AM as long as she remains stable overnight. Patient will notify staff if she develops recurrent pain.  2. HTN with softer BP in the ER - will add hold parameters to metoprolol. She is currently asymptomatic. May be due to the morphine and NTG she received.   3. Hyperlipidemia - check lipids and LFTs. Will tentatively add back statin.   4. Tobacco abuse - counseled regarding importance of cessation.  5. Obesity - lifestyle modification will be important. Body mass index is 35.34 kg/(m^2).  SignedRonie Spies PA-C 11/15/2014, 3:24 PM Pager: [phone]   Attending note:  Patient  seen and examined. Reviewed records and discussed the case with Stacy Douglas. Stacy Douglas presents to the ER with sudden onset chest tightness that reminded her very much of symptoms prior to her presentation with obstructive CAD back in 2011. 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 She went to bed and woke up around 4 in the morning with recurrent chest tightness radiating through to the left shoulder blade, also associated with a numbness and tingling in her left arm. She was evaluated in the ER, given aspirin and nitroglycerin with some easing in symptoms, however not completely resolved until she took morphine. After that symptoms returned and she required additional morphine, subsequently had emesis with this. Initial troponin I levels are normal, ECG is sinus rhythm with low voltage, no acute ST segment changes. On examination she reports no active chest pain, lungs are clear without labored breathing and cardiac exam reveals RRR without gallop. Chest x-ray shows no acute findings. We discussed options for further cardiac evaluation including noninvasive and invasive techniques. Given her history of BMS to the LAD and PTCA of the circumflex in 2011 [facility]), with symptoms very reminiscent of previous angina of sudden onset, plan is to pursue a diagnostic cardiac catheterization for clear definition of the coronary anatomy. We discussed risks and benefits, and she is in agreement to proceed. She will be transferred to our cardiology service at [facility] for further evaluation.  Jonelle Sidle, M.D., F.A.C.C.

## 2014-11-15 NOTE — ED Notes (Addendum)
Removed ntg paste

## 2014-11-16 ENCOUNTER — Encounter (HOSPITAL_COMMUNITY)
Admission: EM | Disposition: A | Discharge status: Home / Self Care | Payer: Self-pay | Source: Home / Self Care | Attending: Cardiology

## 2014-11-16 ENCOUNTER — Ambulatory Visit (HOSPITAL_COMMUNITY)
Admission: RE | Admit: 2014-11-16 | Payer: BLUE CROSS/BLUE SHIELD | Source: Ambulatory Visit | Admitting: Cardiovascular Disease

## 2014-11-16 ENCOUNTER — Other Ambulatory Visit: Payer: Self-pay

## 2014-11-16 DIAGNOSIS — E669 Obesity, unspecified: Secondary | ICD-10-CM | POA: Diagnosis present

## 2014-11-16 DIAGNOSIS — I252 Old myocardial infarction: Secondary | ICD-10-CM | POA: Diagnosis not present

## 2014-11-16 DIAGNOSIS — F17210 Nicotine dependence, cigarettes, uncomplicated: Secondary | ICD-10-CM | POA: Diagnosis present

## 2014-11-16 DIAGNOSIS — Z720 Tobacco use: Secondary | ICD-10-CM | POA: Diagnosis not present

## 2014-11-16 DIAGNOSIS — Z955 Presence of coronary angioplasty implant and graft: Secondary | ICD-10-CM | POA: Diagnosis not present

## 2014-11-16 DIAGNOSIS — R079 Chest pain, unspecified: Secondary | ICD-10-CM | POA: Diagnosis present

## 2014-11-16 DIAGNOSIS — Z7982 Long term (current) use of aspirin: Secondary | ICD-10-CM | POA: Diagnosis not present

## 2014-11-16 DIAGNOSIS — Z8249 Family history of ischemic heart disease and other diseases of the circulatory system: Secondary | ICD-10-CM | POA: Diagnosis not present

## 2014-11-16 DIAGNOSIS — Z79899 Other long term (current) drug therapy: Secondary | ICD-10-CM | POA: Diagnosis not present

## 2014-11-16 DIAGNOSIS — Z885 Allergy status to narcotic agent status: Secondary | ICD-10-CM | POA: Diagnosis not present

## 2014-11-16 DIAGNOSIS — E785 Hyperlipidemia, unspecified: Secondary | ICD-10-CM | POA: Diagnosis not present

## 2014-11-16 DIAGNOSIS — I25110 Atherosclerotic heart disease of native coronary artery with unstable angina pectoris: Secondary | ICD-10-CM | POA: Diagnosis present

## 2014-11-16 DIAGNOSIS — I10 Essential (primary) hypertension: Secondary | ICD-10-CM | POA: Diagnosis present

## 2014-11-16 DIAGNOSIS — Z7902 Long term (current) use of antithrombotics/antiplatelets: Secondary | ICD-10-CM | POA: Diagnosis not present

## 2014-11-16 DIAGNOSIS — Z6835 Body mass index (BMI) 35.0-35.9, adult: Secondary | ICD-10-CM | POA: Diagnosis not present

## 2014-11-16 DIAGNOSIS — E782 Mixed hyperlipidemia: Secondary | ICD-10-CM | POA: Diagnosis present

## 2014-11-16 HISTORY — PX: CARDIAC CATHETERIZATION: SHX172

## 2014-11-16 LAB — CBC
HEMATOCRIT: 43.3 % (ref 36.0–46.0)
HEMOGLOBIN: 14.9 g/dL (ref 12.0–15.0)
MCH: 33.2 pg (ref 26.0–34.0)
MCHC: 34.4 g/dL (ref 30.0–36.0)
MCV: 96.4 fL (ref 78.0–100.0)
Platelets: 261 10*3/uL (ref 150–400)
RBC: 4.49 MIL/uL (ref 3.87–5.11)
RDW: 13.7 % (ref 11.5–15.5)
WBC: 10.3 10*3/uL (ref 4.0–10.5)

## 2014-11-16 LAB — LIPID PANEL
CHOL/HDL RATIO: 4.5 ratio
Cholesterol: 152 mg/dL (ref 0–200)
HDL: 34 mg/dL — ABNORMAL LOW (ref >40)
LDL CALC: 95 mg/dL (ref 0–99)
Triglycerides: 113 mg/dL (ref <150)
VLDL: 23 mg/dL (ref 0–40)

## 2014-11-16 LAB — BASIC METABOLIC PANEL
Anion gap: 9 (ref 5–15)
BUN: 12 mg/dL (ref 6–20)
CHLORIDE: 106 mmol/L (ref 101–111)
CO2: 25 mmol/L (ref 22–32)
Calcium: 8.8 mg/dL — ABNORMAL LOW (ref 8.9–10.3)
Creatinine, Ser: 0.81 mg/dL (ref 0.44–1.00)
GFR calc Af Amer: >60 mL/min (ref >60)
Glucose, Bld: 103 mg/dL — ABNORMAL HIGH (ref 65–99)
Potassium: 4 mmol/L (ref 3.5–5.1)
SODIUM: 140 mmol/L (ref 135–145)

## 2014-11-16 LAB — TROPONIN I
Troponin I: <0.03 ng/mL (ref <0.031)
Troponin I: <0.03 ng/mL (ref <0.031)

## 2014-11-16 LAB — HEPARIN LEVEL (UNFRACTIONATED)
HEPARIN UNFRACTIONATED: 0.43 [IU]/mL (ref 0.30–0.70)
Heparin Unfractionated: 0.19 IU/mL — ABNORMAL LOW (ref 0.30–0.70)

## 2014-11-16 LAB — HEMOGLOBIN A1C
Hgb A1c MFr Bld: 6 % — ABNORMAL HIGH (ref 4.8–5.6)
Mean Plasma Glucose: 126 mg/dL

## 2014-11-16 LAB — POCT ACTIVATED CLOTTING TIME: Activated Clotting Time: 233 seconds

## 2014-11-16 SURGERY — LEFT HEART CATH AND CORONARY ANGIOGRAPHY

## 2014-11-16 MED ORDER — LIDOCAINE HCL (PF) 1 % IJ SOLN
INTRAMUSCULAR | Status: AC
  Filled 2014-11-16: qty 30

## 2014-11-16 MED ORDER — CLOPIDOGREL BISULFATE 300 MG PO TABS
ORAL_TABLET | ORAL | Status: AC
  Filled 2014-11-16: qty 1

## 2014-11-16 MED ORDER — ANGIOPLASTY BOOK
Freq: Once | Status: AC
  Administered 2014-11-16: 21:00:00
  Filled 2014-11-16: qty 1

## 2014-11-16 MED ORDER — NITROGLYCERIN 1 MG/10 ML FOR IR/CATH LAB
INTRA_ARTERIAL | Status: AC
  Filled 2014-11-16: qty 10

## 2014-11-16 MED ORDER — VERAPAMIL HCL 2.5 MG/ML IV SOLN
INTRAVENOUS | Status: AC
  Filled 2014-11-16: qty 2

## 2014-11-16 MED ORDER — CLOPIDOGREL BISULFATE 300 MG PO TABS
ORAL_TABLET | ORAL | Status: DC | PRN
  Administered 2014-11-16: 300 mg via ORAL

## 2014-11-16 MED ORDER — FENTANYL CITRATE (PF) 100 MCG/2ML IJ SOLN
INTRAMUSCULAR | Status: AC
  Filled 2014-11-16: qty 2

## 2014-11-16 MED ORDER — MIDAZOLAM HCL 2 MG/2ML IJ SOLN
INTRAMUSCULAR | Status: AC
  Filled 2014-11-16: qty 2

## 2014-11-16 MED ORDER — HEPARIN SODIUM (PORCINE) 1000 UNIT/ML IJ SOLN
INTRAMUSCULAR | Status: AC
  Filled 2014-11-16: qty 1

## 2014-11-16 MED ORDER — FENTANYL CITRATE (PF) 100 MCG/2ML IJ SOLN
INTRAMUSCULAR | Status: DC | PRN
  Administered 2014-11-16: 25 ug via INTRAVENOUS

## 2014-11-16 MED ORDER — LIDOCAINE HCL (PF) 1 % IJ SOLN
INTRAMUSCULAR | Status: DC | PRN
  Administered 2014-11-16: 2 mL

## 2014-11-16 MED ORDER — ASPIRIN 81 MG PO CHEW
CHEWABLE_TABLET | ORAL | Status: AC
  Filled 2014-11-16: qty 4

## 2014-11-16 MED ORDER — IOHEXOL 350 MG/ML SOLN
INTRAVENOUS | Status: DC | PRN
  Administered 2014-11-16: 50 mL via INTRAVENOUS
  Administered 2014-11-16: 100 mL via INTRAVENOUS

## 2014-11-16 MED ORDER — ONDANSETRON HCL 4 MG/2ML IJ SOLN
4.0000 mg | Freq: Four times a day (QID) | INTRAMUSCULAR | Status: DC | PRN
  Filled 2014-11-16: qty 2

## 2014-11-16 MED ORDER — HEPARIN (PORCINE) IN NACL 2-0.9 UNIT/ML-% IJ SOLN
INTRAMUSCULAR | Status: AC
  Filled 2014-11-16: qty 1000

## 2014-11-16 MED ORDER — SODIUM CHLORIDE 0.9 % IJ SOLN: 3.0000 mL | INTRAMUSCULAR | Status: DC | PRN

## 2014-11-16 MED ORDER — ASPIRIN 325 MG PO TABS
ORAL_TABLET | ORAL | Status: DC | PRN
  Administered 2014-11-16: 325 mg via ORAL

## 2014-11-16 MED ORDER — SODIUM CHLORIDE 0.9 % IJ SOLN
3.0000 mL | Freq: Two times a day (BID) | INTRAMUSCULAR | Status: DC
  Administered 2014-11-17: 09:00:00 3 mL via INTRAVENOUS

## 2014-11-16 MED ORDER — MIDAZOLAM HCL 2 MG/2ML IJ SOLN
INTRAMUSCULAR | Status: DC | PRN
  Administered 2014-11-16: 1 mg via INTRAVENOUS

## 2014-11-16 MED ORDER — SODIUM CHLORIDE 0.9 % IV SOLN: INTRAVENOUS | Status: AC

## 2014-11-16 MED ORDER — VERAPAMIL HCL 2.5 MG/ML IV SOLN
INTRAVENOUS | Status: DC | PRN
  Administered 2014-11-16: 15:00:00 via INTRA_ARTERIAL

## 2014-11-16 MED ORDER — HEPARIN SODIUM (PORCINE) 1000 UNIT/ML IJ SOLN
INTRAMUSCULAR | Status: DC | PRN
  Administered 2014-11-16: 5000 [IU] via INTRAVENOUS
  Administered 2014-11-16: 2000 [IU] via INTRAVENOUS

## 2014-11-16 MED ORDER — SODIUM CHLORIDE 0.9 % IV SOLN: 250.0000 mL | INTRAVENOUS | Status: DC | PRN

## 2014-11-16 MED ORDER — NITROGLYCERIN 1 MG/10 ML FOR IR/CATH LAB
INTRA_ARTERIAL | Status: DC | PRN
  Administered 2014-11-16: 16:00:00

## 2014-11-16 SURGICAL SUPPLY — 25 items
BALLN ANGIOSCULPT RX 2.5X6 (BALLOONS) ×3
BALLN EMERGE MR 2.5X12 (BALLOONS) ×3
BALLN ~~LOC~~ TREK RX 3.0X20 (BALLOONS) ×3
BALLOON ANGIOSCULPT RX 2.5X6 (BALLOONS) IMPLANT
BALLOON EMERGE MR 2.5X12 (BALLOONS) IMPLANT
BALLOON ~~LOC~~ TREK RX 3.0X20 (BALLOONS) IMPLANT
CATH INFINITI 5 FR JL3.5 (CATHETERS) ×2 IMPLANT
CATH INFINITI 5FR ANG PIGTAIL (CATHETERS) ×3 IMPLANT
CATH INFINITI 5FR MULTPACK ANG (CATHETERS) IMPLANT
CATH INFINITI JR4 5F (CATHETERS) ×2 IMPLANT
CATH OPTITORQUE JACKY 4.0 5F (CATHETERS) ×3 IMPLANT
CATH VISTA GUIDE 6FR JR4 (CATHETERS) ×2 IMPLANT
DEVICE RAD COMP TR BAND LRG (VASCULAR PRODUCTS) ×3 IMPLANT
GLIDESHEATH SLEND SS 6F .021 (SHEATH) ×3 IMPLANT
KIT ENCORE 26 ADVANTAGE (KITS) ×2 IMPLANT
KIT HEART LEFT (KITS) ×3 IMPLANT
PACK CARDIAC CATHETERIZATION (CUSTOM PROCEDURE TRAY) ×3 IMPLANT
SHEATH PINNACLE 5F 10CM (SHEATH) IMPLANT
STENT XIENCE ALPINE RX 2.75X33 (Permanent Stent) ×2 IMPLANT
SYR MEDRAD MARK V 150ML (SYRINGE) ×3 IMPLANT
TRANSDUCER W/STOPCOCK (MISCELLANEOUS) ×3 IMPLANT
TUBING CIL FLEX 10 FLL-RA (TUBING) ×3 IMPLANT
WIRE EMERALD 3MM-J .035X150CM (WIRE) IMPLANT
WIRE INTUITION PROPEL ST 180CM (WIRE) ×2 IMPLANT
WIRE SAFE-T 1.5MM-J .035X260CM (WIRE) ×3 IMPLANT

## 2014-11-16 NOTE — Progress Notes (Signed)
ANTICOAGULATION CONSULT NOTE - Follow Up Consult  Pharmacy Consult for Heparin  Indication: chest pain/ACS, hx of V-fib arrest and stent in 2011  Allergies  Allergen Reactions  . Codeine Nausea And Vomiting    Patient Measurements: Height: 5\' 4"  (162.6 cm) Weight: 206 lb (93.441 kg) IBW/kg (Calculated) : 54.7  Vital Signs: Temp: 97.7 F (36.5 C) (07/20 0500) Temp Source: Oral (07/20 0500) BP: 131/72 mmHg (07/20 0510)  Labs:  Recent Labs  11/15/14 0920  11/15/14 1855 11/16/14 0016 11/16/14 0623 11/16/14 0747  HGB 14.1  --   --   --  14.9  --   HCT 41.2  --   --   --  43.3  --   PLT 256  --   --   --  261  --   LABPROT 13.9  --   --   --   --   --   INR 1.05  --   --   --   --   --   HEPARINUNFRC  --   --   --  0.19*  --  0.43  CREATININE 0.94  --   --   --  0.81  --   TROPONINI <0.03  < > <0.03 <0.03 <0.03  --   < > = values in this interval not displayed.  Estimated Creatinine Clearance: 79.8 mL/min (by C-G formula based on Cr of 0.81).  Assessment: 64 yo female with CP with history of CAD w/ PCI and VF arrest. He is on heparin at 1050 units/hr at noted at goal (HL= 0.43). Patient noted for cath today.  Goal of Therapy:  Heparin level 0.3-0.7 units/ml Monitor platelets by anticoagulation protocol: Yes   Plan:  -No heparin changes needed -Will follow plans post cath  Harland GermanAndrew Domique Reardon, Pharm D 11/16/2014 10:03 AM

## 2014-11-16 NOTE — Progress Notes (Signed)
ANTICOAGULATION CONSULT NOTE - Follow Up Consult  Pharmacy Consult for Heparin  Indication: chest pain/ACS, hx of V-fib arrest and stent in 2011  Allergies  Allergen Reactions  . Codeine Nausea And Vomiting    Patient Measurements: Height: 5\' 4"  (162.6 cm) Weight: 206 lb (93.441 kg) IBW/kg (Calculated) : 54.7  Vital Signs: Temp: 98 F (36.7 C) (07/20 0040) Temp Source: Oral (07/20 0040) BP: 109/59 mmHg (07/19 1810) Pulse Rate: 86 (07/19 1632)  Labs:  Recent Labs  11/15/14 0920 11/15/14 1156 11/15/14 1855 11/16/14 0016  HGB 14.1  --   --   --   HCT 41.2  --   --   --   PLT 256  --   --   --   LABPROT 13.9  --   --   --   INR 1.05  --   --   --   HEPARINUNFRC  --   --   --  0.19*  CREATININE 0.94  --   --   --   TROPONINI <0.03 <0.03 <0.03  --     Estimated Creatinine Clearance: 68.8 mL/min (by C-G formula based on Cr of 0.94).  Assessment: Sub-therapeutic heparin level, no issues per RN.   Goal of Therapy:  Heparin level 0.3-0.7 units/ml Monitor platelets by anticoagulation protocol: Yes   Plan:  -Increase heparin to 1050 units/hr -0800 HL -Daily CBC/HL -Monitor for bleeding  Stacy Douglas, Stacy Douglas 11/16/2014,1:02 AM

## 2014-11-16 NOTE — Progress Notes (Signed)
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 Transferred to [facility] for definitive LHC. Enzymes negative x 3.  Subjective: Still with occasional chest discomfort but not severe. No dyspnea.   Objective: Vital signs in last 24 hours: Temp:  [97.7 F (36.5 C)-98.5 F (36.9 C)] 97.7 F (36.5 C) (07/20 0500) Pulse Rate:  [60-86] 86 (07/19 1632) Resp:  [12-20] 16 (07/20 0510) BP: (85-131)/(45-74) 131/72 mmHg (07/20 0510) SpO2:  [95 %-100 %] 99 % (07/19 1632) Weight:  [206 lb (93.441 kg)] 206 lb (93.441 kg) (07/20 0500) Last BM Date: 11/15/14  Intake/Output from previous day:   Intake/Output this shift:    Medications Current Facility-Administered Medications  Medication Dose Route Frequency Provider Last Rate Last Dose  . 0.9 %  sodium chloride infusion  250 mL Intravenous PRN Dayna N Dunn, PA-C      . 0.9% sodium chloride infusion  1 mL/kg/hr Intravenous Continuous Dayna N Dunn, PA-C 93.4 mL/hr at 11/16/14 0627 1 mL/kg/hr at 11/16/14 8119  . acetaminophen (TYLENOL) tablet 650 mg  650 mg Oral Q4H PRN Dayna N Dunn, PA-C      . aspirin EC tablet 81 mg  81 mg Oral Daily Dayna N Dunn, PA-C      . atorvastatin (LIPITOR) tablet 40 mg  40 mg Oral q1800 Dayna N Dunn, PA-C      . clopidogrel (PLAVIX) tablet 75 mg  75 mg Oral QHS Dayna N Dunn, PA-C   75 mg at 11/15/14 2137  . heparin ADULT infusion 100 units/mL (25000 units/250 mL)  1,050 Units/hr Intravenous Continuous Stevphen Rochester, RPH 10.5 mL/hr at 11/16/14 0119 1,050 Units/hr at 11/16/14 0119  . metoprolol succinate (TOPROL-XL) 24 hr tablet 25 mg  25 mg Oral Daily Dayna N Dunn, PA-C      . nitroGLYCERIN (NITROSTAT) SL tablet 0.4 mg  0.4 mg Sublingual Q5 min PRN Dayna N Dunn, PA-C      . nitroGLYCERIN 50  mg in dextrose 5 % 250 mL (0.2 mg/mL) infusion  2-50 mcg/min Intravenous Titrated Dayna N Dunn, PA-C 3 mL/hr at 11/15/14 2300 10 mcg/min at 11/15/14 2300  . promethazine (PHENERGAN) injection 12.5 mg  12.5 mg Intravenous BID PRN Dayna N Dunn, PA-C   12.5 mg at 11/15/14 1659  . sodium chloride 0.9 % injection 3 mL  3 mL Intravenous Q12H Dayna N Dunn, PA-C   3 mL at 11/15/14 2200  . sodium chloride 0.9 % injection 3 mL  3 mL Intravenous PRN Laurann Montana, PA-C        PE: General appearance: alert, cooperative and no distress Neck: no carotid bruit and no JVD Lungs: clear to auscultation bilaterally Heart: regular rate and rhythm, S1, S2 normal, no murmur, click, rub or gallop Extremities: no LEE Pulses: 2+ and symmetric Skin: warm and dry Neurologic: Grossly normal  Lab Results:   Recent Labs  11/15/14 0920 11/16/14 0623  WBC 5.6 10.3  HGB 14.1 14.9  HCT 41.2 43.3  PLT 256 261   BMET  Recent Labs  11/15/14 0920 11/16/14 0623  NA 139 140  K 4.0 4.0  CL 106 106  CO2 25 25  GLUCOSE 106* 103*  BUN 21* 12  CREATININE 0.94 0.81  CALCIUM 8.5* 8.8*   PT/INR  Recent Labs  11/15/14 0920  LABPROT 13.9  INR 1.05   Cholesterol  Recent Labs  11/16/14 0016  CHOL 152   Cardiac Panel (last 3 results)  Recent Labs  11/15/14 1855 11/16/14 0016 11/16/14 0623  TROPONINI <0.03 <0.03 <0.03     Assessment/Plan  Active Problems:   Coronary atherosclerosis of native coronary artery   Hyperlipidemia with target LDL less than 100   Tobacco use disorder   Unstable angina   Essential hypertension   1. CAD/Unstable Angina: recent chest pain symptoms concerning for CAD. She has h/o BMS to LAD and PTCA of Cx in 2011. Cardiac enzymes are negative x 3. She is currently CP free. Continue IV heparin and IV nitro. [facility] with Dr. Kirke CorinArida. Continue ASA, Plavix, BB and statin. Renal function and INR both ok.   2. HTN: BP well controlled. Continue metoprolol.       3. Hyperlipidemia: Patient not on statin therapy prior to admit. LDL not at goal. LDL is 95 mg/dL. Given CAD, ideal goal is less than 70 mg/dL. HFTs normal. Lipitor ordered.   4. Tobacco abuse:  counseled regarding importance of cessation.  5. Obesity:  lifestyle modification will be important. Body mass index is 35.34 kg/(m^2).    LOS: 1 day    Brittainy M. Delmer IslamSimmons, PA-C 11/16/2014 8:01 AM  Patient seen and examined and history reviewed. Agree with above findings and plan. Patient with known history of CAD s/p stenting of the LAD in 2011 with 2.75 mm BMS and PTCA of the LCx with 2.0 mm scoring balloon. Now present with refractory chest pain for 48 hours. Still having 2/10 pain. Cardiac enzymes normal. No Ecg changes. Plan LHC with possible PCI today. The procedure and risks were reviewed including but not limited to death, myocardial infarction, stroke, arrythmias, bleeding, transfusion, emergency surgery, dye allergy, or renal dysfunction. The patient voices understanding and is agreeable to proceed. She has not been on statin therapy. States she thought she could do it on her own. Explained rationale for statin in ACS and with CAD at young age. Will need high dose statin. Recommend complete smoking cessation.   Peter SwazilandJordan, MDFACC 11/16/2014 9:26 AM

## 2014-11-17 ENCOUNTER — Encounter (HOSPITAL_COMMUNITY): Payer: Self-pay | Admitting: Cardiovascular Disease

## 2014-11-17 ENCOUNTER — Telehealth: Payer: Self-pay | Admitting: Cardiology

## 2014-11-17 ENCOUNTER — Encounter: Payer: Self-pay | Admitting: Cardiology

## 2014-11-17 LAB — BASIC METABOLIC PANEL
Anion gap: 7 (ref 5–15)
BUN: 10 mg/dL (ref 6–20)
CALCIUM: 8.5 mg/dL — AB (ref 8.9–10.3)
CO2: 24 mmol/L (ref 22–32)
Chloride: 111 mmol/L (ref 101–111)
Creatinine, Ser: 0.77 mg/dL (ref 0.44–1.00)
GFR calc non Af Amer: >60 mL/min (ref >60)
Glucose, Bld: 111 mg/dL — ABNORMAL HIGH (ref 65–99)
POTASSIUM: 3.6 mmol/L (ref 3.5–5.1)
Sodium: 142 mmol/L (ref 135–145)

## 2014-11-17 LAB — CBC
HCT: 38.8 % (ref 36.0–46.0)
Hemoglobin: 13.4 g/dL (ref 12.0–15.0)
MCH: 32.9 pg (ref 26.0–34.0)
MCHC: 34.5 g/dL (ref 30.0–36.0)
MCV: 95.3 fL (ref 78.0–100.0)
PLATELETS: 238 10*3/uL (ref 150–400)
RBC: 4.07 MIL/uL (ref 3.87–5.11)
RDW: 13.7 % (ref 11.5–15.5)
WBC: 7 10*3/uL (ref 4.0–10.5)

## 2014-11-17 MED ORDER — ROSUVASTATIN CALCIUM 20 MG PO TABS: 20.0000 mg | ORAL_TABLET | Freq: Every day | ORAL | Status: DC

## 2014-11-17 MED ORDER — PRASUGREL HCL 10 MG PO TABS
60.0000 mg | ORAL_TABLET | Freq: Once | ORAL | Status: AC
  Administered 2014-11-17: 09:00:00 60 mg via ORAL
  Filled 2014-11-17: qty 6

## 2014-11-17 MED ORDER — ROSUVASTATIN CALCIUM 20 MG PO TABS: 20.0000 mg | ORAL_TABLET | Freq: Every evening | ORAL | Status: DC

## 2014-11-17 MED ORDER — PRASUGREL HCL 10 MG PO TABS: 10.0000 mg | ORAL_TABLET | Freq: Every day | ORAL | Status: DC

## 2014-11-17 NOTE — Progress Notes (Signed)
CARDIAC REHAB PHASE I   PRE:  Rate/Rhythm: 78 SR  BP:  Supine: 146/79  Sitting:   Standing:    SaO2:   MODE:  Ambulation: 800 ft   POST:  Rate/Rhythm: 96  BP:  Supine:   Sitting: 160/69  Standing:    SaO2:  0800-0905 Pt walked 800 ft with steady gait. No CP. Tolerated well. Education completed with pt who voiced understanding. Notified pt that AIC at 6.0 and to watch carbs. Discussed heart healthy food choices and foods high in carbs to avoid.  Pt did CRP 2 in [place] and request referral. Will refer to [facility] program. Discussed smoking cessation and gave handout and fake cigarette. Pt receptive to trying to quit. Is down to a few a day. Stressed importance of effient with stent. Gave card and booklet.   Luetta Nutting, RN BSN  11/17/2014 9:01 AM

## 2014-11-17 NOTE — Telephone Encounter (Signed)
error 

## 2014-11-17 NOTE — Progress Notes (Signed)
CM spoke with pt regarding Effient, booklet @ bedside with free sample offer and savings cards. Pt verbally stated understanding of usage of both cards. [facility] called by CM to confirm medication in stock and was told they do have Effient in stock. Pt was made aware by CM. No concerns voiced by pt and no other needs identified by CM @ present time. Gae Gallop RN,BSN,CM [phone]

## 2014-11-17 NOTE — Discharge Summary (Signed)
Discharge Summary   Douglas ID: Stacy Douglas MRN: [id number], DOB/AGE: 05-May-1950 64 y.o. Admit date: 11/15/2014 D/C date:     11/17/2014  Primary Care Provider: Bennie Pierini, FNP Primary Cardiologist: Diona Browner  Primary Discharge Diagnoses:  1. Unstable angina 2. CAD  - this admission: s/p DES to prox and mid RCA, otherwise patent LAD stent and moderate disease in Stacy Cx, LVEF 55-65% - history of VF arrest in [place] s/p BMS to LAD 08/2009 and PTCA of Cx in 09/2009 3. Hyperlipidemia -restarted statin this admission 4. HTN 5. Ongoing tobacco abuse 6. Obesity 7. 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 She is on ASA and Plavix chronically and reported med compliance. She is not on a statin because she said she has attempted to manage her cholesterol by diet alone. Last stress test 06/2009 was normal. She was seen in March of this year at which time she was doing well. She has had periodic chest pains on and off over Stacy last year but said these were typically described as a brief tightness in Stacy setting of stress. However, Stacy evening prior to admission she developed chest pains similar to prior angina. After she got home from work and was winding down for Stacy evening, doing routine tasks, she noticed intermittent chest tightness. She didn't think much of it and went on to bed. Around 3:40am Stacy chest pain woke her up, this time associated with left posterior shoulder pain and numbness similar to her event in [place] in 2011. Symptoms did not improve or worsen with movement, palpitation or inspiration. She also felt diaphoretic. Stacy discomfort was intermittent. She also noticed left arm tingling, which was not  present with her prior angina. Due to symptoms she called Stacy office this morning and decided to call 911. She was transported to [facility] where symptoms were managed with aspirin, NTG and morphine. She did have some vomiting which was managed with Zofran and Phenergan. Her soft blood pressure prohibited aggressive med titration. EKG was not particularly revealing but per Douglas in Stacy past it didn't really show much in [place] at Stacy time of her event there. Troponins were negative. CBC WNL. BMET with Cr 0.94, glucose 106. CXR with mild basilar atx, otherwise nonacute. Given quality of discomfort and continued waxing/waning symptoms, she was transferred to [facility] for planned cath Stacy next day. Troponins remained negative. Cardiac cath showed patent LAD stent with moderate disease in Stacy LCx, severe proximal RCA and moderate mid RCA disease likely Stacy culprit for unstable angina. This was treated with DES placement to Stacy prox and mid RCA. She had normal LV systolic function and left ventricular end-diastolic pressure. LDL 95 thus she was started on statin (Crestor 20mg  daily). P2Y12 was 244, indicating a degree of Plavix resistance thus she was loaded and changed to Effient. Today she feels well. Dr. Swaziland has seen and examined Stacy Douglas today and feels he is stable for discharge. At f/u appt she will need LFTs/lipids arranged in 6-8 weeks. I have sent a message to our after-hours scheduler requesting a follow-up appointment, and our office will call Stacy Douglas with this information. She does report that her  job is very stressful and demanding lately and is requesting a week off work - she would like to return Monday 11/28/14 and we have written her a work note.  Physical Exam Vital Signs. BP 124/69 mmHg  Pulse 72  Temp(Src) 97.3 F (36.3 C) (Oral)  Resp 13  Ht 5\' 4"  (1.626 m)  Wt 207 lb 0.2 oz (93.9 kg)  BMI 35.52 kg/m2  SpO2 97% General: Well developed, well nourished WF in no  acute distress. Head: Normocephalic, atraumatic, sclera non-icteric, no xanthomas, nares are without discharge. Neck: Negative for carotid bruits. JVP not elevated. Lungs: Clear bilaterally to auscultation without wheezes, rales, or rhonchi. Breathing is unlabored. Heart: RRR S1 S2 without murmurs, rubs, or gallops.  Abdomen: Soft, non-tender, non-distended with normoactive bowel sounds. No rebound/guarding. Extremities: No clubbing or cyanosis. No edema. Distal pedal pulses are 2+ and equal bilaterally. Radial cath site without hematoma, ecchymosis. Good pulse. Neuro: Alert and oriented X 3. Moves all extremities spontaneously. Psych:  Responds to questions appropriately with a normal affect.  Labs: Lab Results  Component Value Date   WBC 7.0 11/17/2014   HGB 13.4 11/17/2014   HCT 38.8 11/17/2014   MCV 95.3 11/17/2014   PLT 238 11/17/2014    Recent Labs Lab 11/15/14 1855  11/17/14 0234  NA  --   < > 142  K  --   < > 3.6  CL  --   < > 111  CO2  --   < > 24  BUN  --   < > 10  CREATININE  --   < > 0.77  CALCIUM  --   < > 8.5*  PROT 6.4*  --   --   BILITOT 0.6  --   --   ALKPHOS 63  --   --   ALT 17  --   --   AST 19  --   --   GLUCOSE  --   < > 111*  < > = values in this interval not displayed.  Recent Labs  11/15/14 1156 11/15/14 1855 11/16/14 0016 11/16/14 0623  TROPONINI <0.03 <0.03 <0.03 <0.03   Lab Results  Component Value Date   CHOL 152 11/16/2014   HDL 34* 11/16/2014   LDLCALC 95 11/16/2014   TRIG 113 11/16/2014   No results found for: DDIMER  Diagnostic Studies/Procedures   Dg Chest Portable 1 View  11/15/2014   CLINICAL DATA:  Chest pain  EXAM: PORTABLE CHEST - 1 VIEW  COMPARISON:  07/09/11  FINDINGS: Cardiomediastinal silhouette is stable. Study is suboptimal due to Douglas's large body habitus. No acute infiltrate or pulmonary edema. Mild basilar atelectasis.  IMPRESSION: No acute infiltrate or pulmonary edema.  Mild basilar atelectasis.    Electronically Signed   By: Natasha Mead M.D.   On: 11/15/2014 09:22   Cardiac catheterization this admission, please see full report and above for summary.  Discharge Medications   Current Discharge Medication List    START taking these medications   Details  prasugrel (EFFIENT) 10 MG TABS tablet Take 1 tablet (10 mg total) by mouth daily. Qty: 30 tablet, Refills: 11    rosuvastatin (CRESTOR) 20 MG tablet Take 1 tablet (20 mg total) by mouth every evening. Qty: 30 tablet, Refills: 6      CONTINUE these medications which have NOT CHANGED   Details  aspirin EC 81 MG tablet Take 81 mg by mouth daily.    metoprolol succinate (TOPROL-XL) 50 MG 24 hr tablet  TAKE 1/2 TABLET BY MOUTH EVERY DAY     nitroGLYCERIN (NITROSTAT) 0.4 MG SL tablet Place 1 tablet (0.4 mg total) under Stacy tongue every 5 (five) minutes as needed.   Associated Diagnoses: Unspecified essential hypertension; Coronary atherosclerosis of native coronary artery; Pure hypercholesterolemia      STOP taking these medications     clopidogrel (PLAVIX) 75 MG tablet         Disposition   Stacy Douglas will be discharged in stable condition to home. Discharge Instructions    Diet - low sodium heart healthy    Complete by:  As directed      Increase activity slowly    Complete by:  As directed   No driving for 2 days. No lifting over 5 lbs for 1 week. No sexual activity for 1 week. You may return to work 11/28/14. Keep procedure site clean & dry. If you notice increased pain, swelling, bleeding or pus, call/return!  You may shower, but no soaking baths/hot tubs/pools for 1 week.   Your Plavix was changed to Effient due to your bloodwork showing resistance to Plavix.          Follow-up Information    Follow up with Nona Dell, MD.   Specialty:  Cardiology   Why:  Office will call you for your followup appointment. Call office if you have not heard back in 3 days. Make sure to clarify which location your follow-up  appointment will be at [facility] or [place]).   Contact information:   [address] [place] [address] [phone]         Duration of Discharge Encounter: Greater than 30 minutes including physician and PA time.  Thomasene Mohair PA-C 11/17/2014, 8:03 AM   Douglas seen and examined and history reviewed. Agree with above findings and plan. Douglas is doing very well post PCI. Feels much better. Mild bruising at radial cath site with good pulse. P2Y12 indicates poor responsiveness to Plavix. Will switch to Effient. Stressed importance of cholesterol lowering therapy and smoking cessation. She is interested in participating in Cardiac Rehab. She is stable for DC today.  Elya Tarquinio Swaziland, MDFACC 11/17/2014 8:51 AM

## 2014-11-30 ENCOUNTER — Ambulatory Visit (INDEPENDENT_AMBULATORY_CARE_PROVIDER_SITE_OTHER): Payer: BLUE CROSS/BLUE SHIELD | Admitting: Cardiology

## 2014-11-30 ENCOUNTER — Encounter: Payer: Self-pay | Admitting: Cardiology

## 2014-11-30 ENCOUNTER — Encounter: Payer: Self-pay | Admitting: *Deleted

## 2014-11-30 VITALS — BP 118/80 | HR 75 | Ht 64.0 in | Wt 207.0 lb

## 2014-11-30 DIAGNOSIS — Z720 Tobacco use: Secondary | ICD-10-CM | POA: Diagnosis not present

## 2014-11-30 DIAGNOSIS — I10 Essential (primary) hypertension: Secondary | ICD-10-CM

## 2014-11-30 DIAGNOSIS — E7800 Pure hypercholesterolemia, unspecified: Secondary | ICD-10-CM

## 2014-11-30 DIAGNOSIS — I2510 Atherosclerotic heart disease of native coronary artery without angina pectoris: Secondary | ICD-10-CM | POA: Diagnosis not present

## 2014-11-30 MED ORDER — NITROGLYCERIN 0.4 MG SL SUBL: 0.4000 mg | SUBLINGUAL_TABLET | SUBLINGUAL | Status: DC | PRN

## 2014-11-30 MED ORDER — ROSUVASTATIN CALCIUM 10 MG PO TABS: 10.0000 mg | ORAL_TABLET | Freq: Every day | ORAL | Status: DC

## 2014-11-30 NOTE — Progress Notes (Signed)
Cardiology Office Note  Date: 11/30/2014   ID: Stacy Douglas, DOB 1950-11-14, MRN [id number]  PCP: Bennie Pierini, FNP  Primary Cardiologist: Nona Dell, MD   Chief Complaint  Patient presents with  . Hospitalization Follow-up    History of Present Illness: Stacy Douglas is a 64 y.o. female presenting for a post hospital visit. I saw her in consultation back in July in the setting of unstable angina, and she was transferred to [facility] for cardiac catheterization. Troponin I levels were normal. Cardiac catheterization demonstrated patent stent site within the LAD, moderate disease in the circumflex, and significant proximal to mid RCA disease, managed with placement of DES x2. P2Y12 was checked at 244 suggesting a degree of Plavix resistance, therefore she was treated with Effient. She was also placed on Crestor.  She comes in for a follow-up visit today. States that she feels much better. She is still working on complete smoking cessation, has been walking on a treadmill at home twice a week, and gradually building up her time. She has returned to work at [employer]. She is scheduled to start cardiac rehabilitation later this month.  We discussed her medications. She reports no problems with Effient. She is concerned about Crestor, describing some leg cramping which is new. We discussed cutting the dose back to 10 mg daily for now. Her LDL was 95 during hospital stay.   Past Medical History  Diagnosis Date  . Coronary atherosclerosis of native coronary artery     a. VF arrest in [place] s/p BMS LAD 5/11, PTCA circumflex 6/11, LVEF 40%. b. [place] 10/2014 s/p DES to prox and mid RCA, otherwise patent LAD stent and moderate disease in the Cx, LVEF 55-65%.  . Mixed hyperlipidemia   . 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 Essential hypertension   . Tobacco abuse   . Obesity   . Plavix resistance     Past Surgical History  Procedure Laterality  Date  . Cervical spine surgery    . Appendectomy    . Right breast biopsy    . Coronary angioplasty with stent placement    . Cardiac catheterization N/A 11/16/2014    Procedure: Left Heart Cath and Coronary Angiography;  Surgeon: Iran Ouch, MD;  Location: [facility] INVASIVE CV LAB;  Service: Cardiovascular;  Laterality: N/A;  . Cardiac catheterization N/A 11/16/2014    Procedure: Coronary Stent Intervention;  Surgeon: Iran Ouch, MD;  Location: [facility] INVASIVE CV LAB;  Service: Cardiovascular;  Laterality: N/A;    Current Outpatient Prescriptions  Medication Sig Dispense Refill  . aspirin EC 81 MG tablet Take 81 mg by mouth daily.    . metoprolol succinate (TOPROL-XL) 50 MG 24 hr tablet TAKE 1/2 TABLET BY MOUTH EVERY DAY 30 tablet 6  . nitroGLYCERIN (NITROSTAT) 0.4 MG SL tablet Place 1 tablet (0.4 mg total) under the tongue every 5 (five) minutes x 3 doses as needed. 25 tablet 3  . prasugrel (EFFIENT) 10 MG TABS tablet Take 1 tablet (10 mg total) by mouth daily. 30 tablet 11  . rosuvastatin (CRESTOR) 10 MG tablet Take 1 tablet (10 mg total) by mouth daily. 90 tablet 3   No current facility-administered medications for this visit.    Allergies:  Codeine   Social History: The patient  reports that she has been smoking Cigarettes.  She has smoked for the past 30 years. She has never used smokeless tobacco. She reports that she  does not drink alcohol or use illicit drugs.   ROS:  Please see the history of present illness. Otherwise, complete review of systems is positive for none.  All other systems are reviewed and negative.   Physical Exam: VS:  BP 118/80 mmHg  Pulse 75  Ht 5\' 4"  (1.626 m)  Wt 207 lb (93.895 kg)  BMI 35.51 kg/m2  SpO2 95%, BMI Body mass index is 35.51 kg/(m^2).  Wt Readings from Last 3 Encounters:  11/30/14 207 lb (93.895 kg)  11/17/14 207 lb 0.2 oz (93.9 kg)  07/20/14 206 lb 12.8 oz (93.804 kg)     General: Patient appears comfortable at rest. HEENT:  Conjunctiva and lids normal, oropharynx clear with moist mucosa. Neck: Supple, no elevated JVP or carotid bruits, no thyromegaly. Lungs: Clear to auscultation, nonlabored breathing at rest. Cardiac: Regular rate and rhythm, no S3 or significant systolic murmur, no pericardial rub. Abdomen: Soft, nontender, no hepatomegaly, bowel sounds present, no guarding or rebound. Extremities: No pitting edema, distal pulses 2+. Skin: Warm and dry. Musculoskeletal: No kyphosis. Neuropsychiatric: Alert and oriented x3, affect grossly appropriate.   ECG: Tracing from 11/17/2014 showed normal sinus rhythm.   Recent Labwork: 11/15/2014: ALT 17; AST 19; TSH 1.366 11/17/2014: BUN 10; Creatinine, Ser 0.77; Hemoglobin 13.4; Platelets 238; Potassium 3.6; Sodium 142     Component Value Date/Time   CHOL 152 11/16/2014 0016   TRIG 113 11/16/2014 0016   TRIG 118 11/02/2013 1428   HDL 34* 11/16/2014 0016   HDL 41 11/02/2013 1428   CHOLHDL 4.5 11/16/2014 0016   VLDL 23 11/16/2014 0016   LDLCALC 95 11/16/2014 0016   LDLCALC 103* 11/02/2013 1428    Assessment and Plan:  1. CAD status post recent presentation with unstable angina, troponin I levels negative. She was found to have patent BMS site in the LAD, but new disease in the RCA requiring placement of 2 DES to the proximal and mid segment. She is now on medical therapy including aspirin and Effient. I encouraged her to continue with complete rehabilitation program.  2. Tobacco abuse, working port complete smoking cessation.  3. Recent LDL 95. We are cutting Crestor back to 10 mg daily due to concern about side effects.  4. Essential hypertension, blood pressure is well controlled today.  Current medicines were reviewed with the patient today.  Disposition: FU with me in 2 months.   Signed, Jonelle Sidle, MD, [facility] 11/30/2014 3:18 PM    [facility] [address] [place], [place], [place] [address] Phone: [phone];  Fax: [phone]

## 2014-11-30 NOTE — Patient Instructions (Signed)
Your physician has recommended you make the following change in your medication:  Decrease your crestor to 10 mg daily. You may break your 20 mg tablet in half daily until they are finished. Continue all other medications the same. Refill sent to the pharmacy for your nitroglycerin. Your physician recommends that you schedule a follow-up appointment in: 2 months. You will receive a reminder letter in the mail in about 1 month reminding you to call and schedule your appointment. If you don't receive this letter, please contact our office.

## 2014-12-14 ENCOUNTER — Encounter (HOSPITAL_COMMUNITY): Payer: Self-pay

## 2014-12-14 ENCOUNTER — Encounter (HOSPITAL_COMMUNITY)
Admission: RE | Admit: 2014-12-14 | Discharge: 2014-12-14 | Disposition: A | Discharge status: Home / Self Care | Payer: BLUE CROSS/BLUE SHIELD | Source: Ambulatory Visit | Attending: Cardiology | Admitting: Cardiology

## 2014-12-14 VITALS — BP 110/66 | HR 78 | Ht 64.0 in | Wt 207.8 lb

## 2014-12-14 DIAGNOSIS — Z9889 Other specified postprocedural states: Secondary | ICD-10-CM | POA: Insufficient documentation

## 2014-12-14 DIAGNOSIS — I200 Unstable angina: Secondary | ICD-10-CM | POA: Diagnosis not present

## 2014-12-14 DIAGNOSIS — Z955 Presence of coronary angioplasty implant and graft: Secondary | ICD-10-CM

## 2014-12-14 NOTE — Progress Notes (Signed)
Cardiac/Pulmonary Rehab Medication Review by a Pharmacist  Does the patient  feel that his/her medications are working for him/her?  yes  Has the patient been experiencing any side effects to the medications prescribed?  Yes (muscle cramps with crestor, improved with reduced dose, minor bruising associated w/ Effient).  Neither side effect is precluding patient from continuing with therapy.  Does the patient measure his/her own blood pressure or blood glucose at home?  yes   Does the patient have any problems obtaining medications due to transportation or finances?   no  Understanding of regimen: excellent Understanding of indications: excellent Potential of compliance: excellent  Pharmacist comments: Good understanding of medications, no issues noted.  Mady Gemma 12/14/2014 1:59 PM

## 2014-12-14 NOTE — Progress Notes (Signed)
Patient arrived for 1st visit/orientation/education at 1330. Patient was referred to CR by Dr. Diona Browner due to Stent x 2 (Z95.5). During orientation advised patient on arrival and appointment times what to wear, what to do before, during and after exercise. Reviewed attendance and class policy. Talked about inclement weather and class consultation policy. Pt is scheduled to return Cardiac Rehab on December 19, 2014 at 1545. Pt was advised to come to class 5 minutes before class starts. She was also given instructions on meeting with the dietician and attending the Family Structure classes. Pt is eager to get started. Patient was able to complete 6 minute walk test. Patient was measured for the equipment. Discussed equipment safety with patient. Took patient pre-anthropometric measurements. Patient finished visit at 1505.

## 2014-12-14 NOTE — Patient Instructions (Signed)
Pt has finished orientation and is scheduled to return December 19, 2014 at 1545. Pt has been instructed to arrive to class 15 minutes early for scheduled class. Pt has been instructed to wear comfortable clothing and shoes with rubber soles. Pt has been told to take their medications 1 hour prior to coming to class.  If the patient is not going to attend class, she has been instructed to call.

## 2014-12-19 ENCOUNTER — Encounter (HOSPITAL_COMMUNITY)
Admission: RE | Admit: 2014-12-19 | Discharge: 2014-12-19 | Disposition: A | Discharge status: Home / Self Care | Payer: BLUE CROSS/BLUE SHIELD | Source: Ambulatory Visit | Attending: Cardiology | Admitting: Cardiology

## 2014-12-19 DIAGNOSIS — I200 Unstable angina: Secondary | ICD-10-CM | POA: Diagnosis not present

## 2014-12-21 ENCOUNTER — Encounter (HOSPITAL_COMMUNITY)
Admission: RE | Admit: 2014-12-21 | Discharge: 2014-12-21 | Disposition: A | Discharge status: Home / Self Care | Payer: BLUE CROSS/BLUE SHIELD | Source: Ambulatory Visit | Attending: Cardiology | Admitting: Cardiology

## 2014-12-21 DIAGNOSIS — I200 Unstable angina: Secondary | ICD-10-CM | POA: Diagnosis not present

## 2014-12-23 ENCOUNTER — Encounter (HOSPITAL_COMMUNITY)
Admission: RE | Admit: 2014-12-23 | Discharge: 2014-12-23 | Disposition: A | Discharge status: Home / Self Care | Payer: BLUE CROSS/BLUE SHIELD | Source: Ambulatory Visit | Attending: Cardiology | Admitting: Cardiology

## 2014-12-23 DIAGNOSIS — I200 Unstable angina: Secondary | ICD-10-CM | POA: Diagnosis not present

## 2014-12-23 NOTE — Progress Notes (Signed)
Cardiac Rehabilitation Program Outcomes Report   Orientation:  12/14/14 Graduate Date:  tbd Discharge Date:  tbd # of sessions completed: 3  Cardiologist: MsDowell Family MD:  Karoline Caldwell Class Time:  1545  A.  Exercise Program:  Tolerates exercise @ 2.15 METS for 15 minutes and Walk Test Results:  Pre: 3.03 mets  B.  Mental Health:  WNL  C.  Education/Instruction/Skills  Accurately checks own pulse.  Rest:  82  Exercise:  96  and Knows THR for exercise  Uses Perceived Exertion Scale and/or Dyspnea Scale  D.  Nutrition/Weight Control/Body Composition:  Adherence to prescribed nutrition program: fair    E.  Blood Lipids    Lab Results  Component Value Date   CHOL 152 11/16/2014   HDL 34* 11/16/2014   LDLCALC 95 11/16/2014   TRIG 113 11/16/2014   CHOLHDL 4.5 11/16/2014    F.  Lifestyle Changes:  Making positive lifestyle changes  G.  Symptoms noted with exercise:  Asymptomatic  Report Completed By:  Doretha Sou RN    Comments:  This is the patients first week progress note for [facility].

## 2014-12-26 ENCOUNTER — Encounter (HOSPITAL_COMMUNITY)
Admission: RE | Admit: 2014-12-26 | Discharge: 2014-12-26 | Disposition: A | Discharge status: Home / Self Care | Payer: BLUE CROSS/BLUE SHIELD | Source: Ambulatory Visit | Attending: Cardiology | Admitting: Cardiology

## 2014-12-26 DIAGNOSIS — I200 Unstable angina: Secondary | ICD-10-CM | POA: Diagnosis not present

## 2014-12-28 ENCOUNTER — Encounter (HOSPITAL_COMMUNITY)
Admission: RE | Admit: 2014-12-28 | Discharge: 2014-12-28 | Disposition: A | Discharge status: Home / Self Care | Payer: BLUE CROSS/BLUE SHIELD | Source: Ambulatory Visit | Attending: Cardiology | Admitting: Cardiology

## 2014-12-28 DIAGNOSIS — I200 Unstable angina: Secondary | ICD-10-CM | POA: Diagnosis not present

## 2014-12-30 ENCOUNTER — Encounter (HOSPITAL_COMMUNITY)
Admission: RE | Admit: 2014-12-30 | Discharge: 2014-12-30 | Disposition: A | Discharge status: Home / Self Care | Payer: BLUE CROSS/BLUE SHIELD | Source: Ambulatory Visit | Attending: Cardiology | Admitting: Cardiology

## 2014-12-30 DIAGNOSIS — I200 Unstable angina: Secondary | ICD-10-CM | POA: Insufficient documentation

## 2014-12-30 DIAGNOSIS — Z9889 Other specified postprocedural states: Secondary | ICD-10-CM | POA: Diagnosis not present

## 2015-01-02 ENCOUNTER — Encounter (HOSPITAL_COMMUNITY): Payer: BLUE CROSS/BLUE SHIELD

## 2015-01-04 ENCOUNTER — Encounter (HOSPITAL_COMMUNITY)
Admission: RE | Admit: 2015-01-04 | Discharge: 2015-01-04 | Disposition: A | Discharge status: Home / Self Care | Payer: BLUE CROSS/BLUE SHIELD | Source: Ambulatory Visit | Attending: Cardiology | Admitting: Cardiology

## 2015-01-04 DIAGNOSIS — I200 Unstable angina: Secondary | ICD-10-CM | POA: Diagnosis not present

## 2015-01-06 ENCOUNTER — Encounter (HOSPITAL_COMMUNITY)
Admission: RE | Admit: 2015-01-06 | Discharge: 2015-01-06 | Disposition: A | Discharge status: Home / Self Care | Payer: BLUE CROSS/BLUE SHIELD | Source: Ambulatory Visit | Attending: Cardiology | Admitting: Cardiology

## 2015-01-06 DIAGNOSIS — I200 Unstable angina: Secondary | ICD-10-CM | POA: Diagnosis not present

## 2015-01-06 NOTE — Progress Notes (Signed)
Home exercise plan given to patient today. Patient verbalized understanding.

## 2015-01-09 ENCOUNTER — Encounter (HOSPITAL_COMMUNITY)
Admission: RE | Admit: 2015-01-09 | Discharge: 2015-01-09 | Disposition: A | Discharge status: Home / Self Care | Payer: BLUE CROSS/BLUE SHIELD | Source: Ambulatory Visit | Attending: Cardiology | Admitting: Cardiology

## 2015-01-09 DIAGNOSIS — I200 Unstable angina: Secondary | ICD-10-CM | POA: Diagnosis not present

## 2015-01-11 ENCOUNTER — Encounter (HOSPITAL_COMMUNITY)
Admission: RE | Admit: 2015-01-11 | Discharge: 2015-01-11 | Disposition: A | Discharge status: Home / Self Care | Payer: BLUE CROSS/BLUE SHIELD | Source: Ambulatory Visit | Attending: Cardiology | Admitting: Cardiology

## 2015-01-11 DIAGNOSIS — I200 Unstable angina: Secondary | ICD-10-CM | POA: Diagnosis not present

## 2015-01-13 ENCOUNTER — Encounter (HOSPITAL_COMMUNITY)
Admission: RE | Admit: 2015-01-13 | Discharge: 2015-01-13 | Disposition: A | Discharge status: Home / Self Care | Payer: BLUE CROSS/BLUE SHIELD | Source: Ambulatory Visit | Attending: Cardiology | Admitting: Cardiology

## 2015-01-13 DIAGNOSIS — I200 Unstable angina: Secondary | ICD-10-CM | POA: Diagnosis not present

## 2015-01-16 ENCOUNTER — Encounter (HOSPITAL_COMMUNITY)
Admission: RE | Admit: 2015-01-16 | Discharge: 2015-01-16 | Disposition: A | Discharge status: Home / Self Care | Payer: BLUE CROSS/BLUE SHIELD | Source: Ambulatory Visit | Attending: Cardiology | Admitting: Cardiology

## 2015-01-16 DIAGNOSIS — I200 Unstable angina: Secondary | ICD-10-CM | POA: Diagnosis not present

## 2015-01-18 ENCOUNTER — Encounter (HOSPITAL_COMMUNITY)
Admission: RE | Admit: 2015-01-18 | Discharge: 2015-01-18 | Disposition: A | Discharge status: Home / Self Care | Payer: BLUE CROSS/BLUE SHIELD | Source: Ambulatory Visit | Attending: Cardiology | Admitting: Cardiology

## 2015-01-18 DIAGNOSIS — I200 Unstable angina: Secondary | ICD-10-CM | POA: Diagnosis not present

## 2015-01-20 ENCOUNTER — Encounter (HOSPITAL_COMMUNITY)
Admission: RE | Admit: 2015-01-20 | Discharge: 2015-01-20 | Disposition: A | Discharge status: Home / Self Care | Payer: BLUE CROSS/BLUE SHIELD | Source: Ambulatory Visit | Attending: Cardiology | Admitting: Cardiology

## 2015-01-20 DIAGNOSIS — I200 Unstable angina: Secondary | ICD-10-CM | POA: Diagnosis not present

## 2015-01-23 ENCOUNTER — Encounter (HOSPITAL_COMMUNITY)
Admission: RE | Admit: 2015-01-23 | Discharge: 2015-01-23 | Disposition: A | Discharge status: Home / Self Care | Payer: BLUE CROSS/BLUE SHIELD | Source: Ambulatory Visit | Attending: Cardiology | Admitting: Cardiology

## 2015-01-23 DIAGNOSIS — I200 Unstable angina: Secondary | ICD-10-CM | POA: Diagnosis not present

## 2015-01-25 ENCOUNTER — Encounter (HOSPITAL_COMMUNITY)
Admission: RE | Admit: 2015-01-25 | Discharge: 2015-01-25 | Disposition: A | Discharge status: Home / Self Care | Payer: BLUE CROSS/BLUE SHIELD | Source: Ambulatory Visit | Attending: Cardiology | Admitting: Cardiology

## 2015-01-25 DIAGNOSIS — I200 Unstable angina: Secondary | ICD-10-CM | POA: Diagnosis not present

## 2015-01-27 ENCOUNTER — Encounter (HOSPITAL_COMMUNITY)
Admission: RE | Admit: 2015-01-27 | Discharge: 2015-01-27 | Disposition: A | Discharge status: Home / Self Care | Payer: BLUE CROSS/BLUE SHIELD | Source: Ambulatory Visit | Attending: Cardiology | Admitting: Cardiology

## 2015-01-27 DIAGNOSIS — I200 Unstable angina: Secondary | ICD-10-CM | POA: Diagnosis not present

## 2015-01-30 ENCOUNTER — Encounter (HOSPITAL_COMMUNITY)
Admission: RE | Admit: 2015-01-30 | Discharge: 2015-01-30 | Disposition: A | Discharge status: Home / Self Care | Payer: BLUE CROSS/BLUE SHIELD | Source: Ambulatory Visit | Attending: Cardiology | Admitting: Cardiology

## 2015-01-30 DIAGNOSIS — I200 Unstable angina: Secondary | ICD-10-CM | POA: Insufficient documentation

## 2015-01-30 DIAGNOSIS — Z9889 Other specified postprocedural states: Secondary | ICD-10-CM | POA: Insufficient documentation

## 2015-01-31 ENCOUNTER — Ambulatory Visit (INDEPENDENT_AMBULATORY_CARE_PROVIDER_SITE_OTHER): Payer: BLUE CROSS/BLUE SHIELD | Admitting: Cardiology

## 2015-01-31 ENCOUNTER — Encounter: Payer: Self-pay | Admitting: Cardiology

## 2015-01-31 VITALS — BP 110/74 | HR 81 | Ht 64.0 in | Wt 203.0 lb

## 2015-01-31 DIAGNOSIS — Z5181 Encounter for therapeutic drug level monitoring: Secondary | ICD-10-CM

## 2015-01-31 DIAGNOSIS — I10 Essential (primary) hypertension: Secondary | ICD-10-CM

## 2015-01-31 DIAGNOSIS — I2510 Atherosclerotic heart disease of native coronary artery without angina pectoris: Secondary | ICD-10-CM

## 2015-01-31 DIAGNOSIS — E785 Hyperlipidemia, unspecified: Secondary | ICD-10-CM

## 2015-01-31 DIAGNOSIS — E782 Mixed hyperlipidemia: Secondary | ICD-10-CM

## 2015-01-31 NOTE — Progress Notes (Signed)
Cardiology Office Note  Date: 01/31/2015   ID: Stacy Douglas, DOB 02-03-1951, MRN [id number]  PCP: Bennie Pierini, FNP  Primary Cardiologist: Nona Dell, MD   Chief Complaint  Patient presents with  . Coronary Artery Disease    History of Present Illness: Stacy Douglas is a 64 y.o. female last seen in August. She presents for a routine follow-up visit. She reports no angina symptoms, states that she has been compliant with her medications. Generally feels well. She has lost about 6 pounds.  Crestor was cut back to 10 mg daily at the last visit. Since that time she cut the dose back to 5 mg daily and states that her leg discomfort has resolved. She has not had a follow-up lipid panel as yet.  She has continued in the cardiac rehabilitation program. She has enjoyed this quite a bit. She does plan to continue exercising regularly on her own when she graduates.  Cardiac catheterization in July demonstrated patent stent site within the LAD, moderate disease in the circumflex, and significant proximal to mid RCA disease, managed with placement of DES x2.  Past Medical History  Diagnosis Date  . Coronary atherosclerosis of native coronary artery     a. VF arrest in [place] s/p BMS LAD 5/11, PTCA circumflex 6/11, LVEF 40%. b. [place] 10/2014 s/p DES to prox and mid RCA, otherwise patent LAD stent and moderate disease in the Cx, LVEF 55-65%.  . Mixed hyperlipidemia   . Myocardial infarction (HCC)     5/11, complicated by VF arrest  . Essential hypertension   . Tobacco abuse   . Obesity   . Plavix resistance     Past Surgical History  Procedure Laterality Date  . Cervical spine surgery    . Appendectomy    . Right breast biopsy    . Coronary angioplasty with stent placement    . Cardiac catheterization N/A 11/16/2014    Procedure: Left Heart Cath and Coronary Angiography;  Surgeon: Iran Ouch, MD;  Location: [facility];  Service: Cardiovascular;  Laterality:  N/A;  . Cardiac catheterization N/A 11/16/2014    Procedure: Coronary Stent Intervention;  Surgeon: Iran Ouch, MD;  Location: [facility];  Service: Cardiovascular;  Laterality: N/A;    Current Outpatient Prescriptions  Medication Sig Dispense Refill  . aspirin EC 81 MG tablet Take 81 mg by mouth daily.    . metoprolol succinate (TOPROL-XL) 50 MG 24 hr tablet TAKE 1/2 TABLET BY MOUTH EVERY DAY 30 tablet 6  . nitroGLYCERIN (NITROSTAT) 0.4 MG SL tablet Place 1 tablet (0.4 mg total) under the tongue every 5 (five) minutes x 3 doses as needed. 25 tablet 3  . prasugrel (EFFIENT) 10 MG TABS tablet Take 1 tablet (10 mg total) by mouth daily. 30 tablet 11  . rosuvastatin (CRESTOR) 10 MG tablet Take 5 mg by mouth daily.     No current facility-administered medications for this visit.    Allergies:  Codeine   Social History: The patient  reports that she has been smoking Cigarettes.  She has smoked for the past 30 years. She has never used smokeless tobacco. She reports that she does not drink alcohol or use illicit drugs.   ROS:  Please see the history of present illness. Otherwise, complete review of systems is positive for none.  All other systems are reviewed and negative.   Physical Exam: VS:  BP 110/74 mmHg  Pulse 81  Ht  (  1.626 m)  Wt 203 lb (92.08 kg)  BMI 34.83 kg/m2  SpO2 97%, BMI Body mass index is 34.83 kg/(m^2).  Wt Readings from Last 3 Encounters:  01/31/15 203 lb (92.08 kg)  12/14/14 207 lb 12.8 oz (94.257 kg)  11/30/14 207 lb (93.895 kg)     General: Patient appears comfortable at rest. HEENT: Conjunctiva and lids normal, oropharynx clear with moist mucosa. Neck: Supple, no elevated JVP or carotid bruits, no thyromegaly. Lungs: Clear to auscultation, nonlabored breathing at rest. Cardiac: Regular rate and rhythm, no S3 or significant systolic murmur, no pericardial rub. Abdomen: Soft, nontender, no hepatomegaly, bowel sounds present, no guarding or  rebound. Extremities: No pitting edema, distal pulses 2+. Skin: Warm and dry. Musculoskeletal: No kyphosis. Neuropsychiatric: Alert and oriented x3, affect grossly appropriate.   ECG: ECG is not ordered today.   Recent Labwork: 11/15/2014: ALT 17; AST 19; TSH 1.366 11/17/2014: BUN 10; Creatinine, Ser 0.77; Hemoglobin 13.4; Platelets 238; Potassium 3.6; Sodium 142     Component Value Date/Time   CHOL 152 11/16/2014 0016   TRIG 113 11/16/2014 0016   TRIG 118 11/02/2013 1428   HDL 34* 11/16/2014 0016   HDL 41 11/02/2013 1428   CHOLHDL 4.5 11/16/2014 0016   VLDL 23 11/16/2014 0016   LDLCALC 95 11/16/2014 0016   LDLCALC 103* 11/02/2013 1428    Assessment and Plan:  1. Symptomatically stable CAD status post DES to the proximal and mid RCA in July. She continues in cardiac rehabilitation. Overall has been progressing well. Plan to continue medical therapy and observation. She continues on DAPT.  2. Hyperlipidemia, now on Crestor 5 mg daily. She is tolerating this dose. Follow-up FLP and LFTs.  3. Essential hypertension, blood pressure is well controlled today. No changes were made.  Current medicines were reviewed with the patient today.   Orders Placed This Encounter  Procedures  . Hepatic function panel  . Lipid panel    Disposition: FU with in 4 months.   Signed, Jonelle Sidle, MD, [facility] 01/31/2015 1:19 PM    [facility] at [facility] [address] [place], [place], [place] [address] Phone: [phone]; Fax: [phone]

## 2015-01-31 NOTE — Patient Instructions (Signed)
Your physician recommends that you continue on your current medications as directed.  Your physician recommends that you return for a FASTING lipid/liver profile as soon as possible. Your physician recommends that you schedule a follow-up appointment in: 4 months. You will receive a reminder letter in the mail in about 1-2 months reminding you to call and schedule your appointment. If you don't receive this letter, please contact our office.

## 2015-02-01 ENCOUNTER — Encounter (HOSPITAL_COMMUNITY)
Admission: RE | Admit: 2015-02-01 | Discharge: 2015-02-01 | Disposition: A | Discharge status: Home / Self Care | Payer: BLUE CROSS/BLUE SHIELD | Source: Ambulatory Visit | Attending: Cardiology | Admitting: Cardiology

## 2015-02-01 DIAGNOSIS — I200 Unstable angina: Secondary | ICD-10-CM | POA: Diagnosis not present

## 2015-02-01 NOTE — Progress Notes (Signed)
Cardiac Rehabilitation Program Outcomes Report   Orientation:  12/14/14 Graduate Date:  tbd Discharge Date:  tbd # of sessions completed: 18  Cardiologist: Hermina Staggers MD:  Karoline Caldwell Class Time:  1545  A.  Exercise Program:  Tolerates exercise @ 3.63 METS for 15 minutes  B.  Mental Health:  Good mental attitude  C.  Education/Instruction/Skills  Accurately checks own pulse.  Rest:  86  Exercise:  112 and Knows THR for exercise  Uses Perceived Exertion Scale and/or Dyspnea Scale  D.  Nutrition/Weight Control/Body Composition:  Adherence to prescribed nutrition program: good    E.  Blood Lipids    Lab Results  Component Value Date   CHOL 152 11/16/2014   HDL 34* 11/16/2014   LDLCALC 95 11/16/2014   TRIG 113 11/16/2014   CHOLHDL 4.5 11/16/2014    F.  Lifestyle Changes:  Making positive lifestyle changes  G.  Symptoms noted with exercise:  Asymptomatic  Report Completed By:  Doretha Sou RN   Comments:  This is the patients half way progress note for [facility].

## 2015-02-03 ENCOUNTER — Encounter (HOSPITAL_COMMUNITY)
Admission: RE | Admit: 2015-02-03 | Discharge: 2015-02-03 | Disposition: A | Discharge status: Home / Self Care | Payer: BLUE CROSS/BLUE SHIELD | Source: Ambulatory Visit | Attending: Cardiology | Admitting: Cardiology

## 2015-02-03 DIAGNOSIS — I200 Unstable angina: Secondary | ICD-10-CM | POA: Diagnosis not present

## 2015-02-06 ENCOUNTER — Encounter (HOSPITAL_COMMUNITY)
Admission: RE | Admit: 2015-02-06 | Discharge: 2015-02-06 | Disposition: A | Discharge status: Home / Self Care | Payer: BLUE CROSS/BLUE SHIELD | Source: Ambulatory Visit | Attending: Cardiology | Admitting: Cardiology

## 2015-02-06 DIAGNOSIS — I200 Unstable angina: Secondary | ICD-10-CM | POA: Diagnosis not present

## 2015-02-08 ENCOUNTER — Encounter (HOSPITAL_COMMUNITY)
Admission: RE | Admit: 2015-02-08 | Discharge: 2015-02-08 | Disposition: A | Discharge status: Home / Self Care | Payer: BLUE CROSS/BLUE SHIELD | Source: Ambulatory Visit | Attending: Cardiology | Admitting: Cardiology

## 2015-02-08 DIAGNOSIS — I200 Unstable angina: Secondary | ICD-10-CM | POA: Diagnosis not present

## 2015-02-10 ENCOUNTER — Encounter (HOSPITAL_COMMUNITY)
Admission: RE | Admit: 2015-02-10 | Discharge: 2015-02-10 | Disposition: A | Discharge status: Home / Self Care | Payer: BLUE CROSS/BLUE SHIELD | Source: Ambulatory Visit | Attending: Cardiology | Admitting: Cardiology

## 2015-02-10 DIAGNOSIS — I200 Unstable angina: Secondary | ICD-10-CM | POA: Diagnosis not present

## 2015-02-13 ENCOUNTER — Encounter (HOSPITAL_COMMUNITY)
Admission: RE | Admit: 2015-02-13 | Discharge: 2015-02-13 | Disposition: A | Discharge status: Home / Self Care | Payer: BLUE CROSS/BLUE SHIELD | Source: Ambulatory Visit | Attending: Cardiology | Admitting: Cardiology

## 2015-02-13 DIAGNOSIS — I200 Unstable angina: Secondary | ICD-10-CM | POA: Diagnosis not present

## 2015-02-15 ENCOUNTER — Encounter (HOSPITAL_COMMUNITY)
Admission: RE | Admit: 2015-02-15 | Discharge: 2015-02-15 | Disposition: A | Discharge status: Home / Self Care | Payer: BLUE CROSS/BLUE SHIELD | Source: Ambulatory Visit | Attending: Cardiology | Admitting: Cardiology

## 2015-02-15 DIAGNOSIS — I200 Unstable angina: Secondary | ICD-10-CM | POA: Diagnosis not present

## 2015-02-17 ENCOUNTER — Encounter (HOSPITAL_COMMUNITY)
Admission: RE | Admit: 2015-02-17 | Discharge: 2015-02-17 | Disposition: A | Discharge status: Home / Self Care | Payer: BLUE CROSS/BLUE SHIELD | Source: Ambulatory Visit | Attending: Cardiology | Admitting: Cardiology

## 2015-02-17 DIAGNOSIS — I200 Unstable angina: Secondary | ICD-10-CM | POA: Diagnosis not present

## 2015-02-20 ENCOUNTER — Encounter (HOSPITAL_COMMUNITY)
Admission: RE | Admit: 2015-02-20 | Discharge: 2015-02-20 | Disposition: A | Discharge status: Home / Self Care | Payer: BLUE CROSS/BLUE SHIELD | Source: Ambulatory Visit | Attending: Cardiology | Admitting: Cardiology

## 2015-02-20 DIAGNOSIS — I200 Unstable angina: Secondary | ICD-10-CM | POA: Diagnosis not present

## 2015-02-22 ENCOUNTER — Encounter (HOSPITAL_COMMUNITY)
Admission: RE | Admit: 2015-02-22 | Discharge: 2015-02-22 | Disposition: A | Discharge status: Home / Self Care | Payer: BLUE CROSS/BLUE SHIELD | Source: Ambulatory Visit | Attending: Cardiology | Admitting: Cardiology

## 2015-02-22 DIAGNOSIS — I200 Unstable angina: Secondary | ICD-10-CM | POA: Diagnosis not present

## 2015-02-24 ENCOUNTER — Encounter (HOSPITAL_COMMUNITY)
Admission: RE | Admit: 2015-02-24 | Discharge: 2015-02-24 | Disposition: A | Discharge status: Home / Self Care | Payer: BLUE CROSS/BLUE SHIELD | Source: Ambulatory Visit | Attending: Cardiology | Admitting: Cardiology

## 2015-02-24 DIAGNOSIS — I200 Unstable angina: Secondary | ICD-10-CM | POA: Diagnosis not present

## 2015-02-27 ENCOUNTER — Encounter (HOSPITAL_COMMUNITY): Admission: RE | Admit: 2015-02-27 | Payer: BLUE CROSS/BLUE SHIELD | Source: Ambulatory Visit

## 2015-03-01 ENCOUNTER — Encounter (HOSPITAL_COMMUNITY)
Admission: RE | Admit: 2015-03-01 | Discharge: 2015-03-01 | Disposition: A | Discharge status: Home / Self Care | Payer: BLUE CROSS/BLUE SHIELD | Source: Ambulatory Visit | Attending: Cardiology | Admitting: Cardiology

## 2015-03-01 DIAGNOSIS — I200 Unstable angina: Secondary | ICD-10-CM | POA: Diagnosis not present

## 2015-03-01 DIAGNOSIS — Z9889 Other specified postprocedural states: Secondary | ICD-10-CM | POA: Insufficient documentation

## 2015-03-03 ENCOUNTER — Encounter (HOSPITAL_COMMUNITY)
Admission: RE | Admit: 2015-03-03 | Discharge: 2015-03-03 | Disposition: A | Discharge status: Home / Self Care | Payer: BLUE CROSS/BLUE SHIELD | Source: Ambulatory Visit | Attending: Cardiology | Admitting: Cardiology

## 2015-03-03 DIAGNOSIS — I200 Unstable angina: Secondary | ICD-10-CM | POA: Diagnosis not present

## 2015-03-04 LAB — HEPATIC FUNCTION PANEL
ALBUMIN: 4 g/dL (ref 3.6–5.1)
ALT: 11 U/L (ref 6–29)
AST: 15 U/L (ref 10–35)
Alkaline Phosphatase: 74 U/L (ref 33–130)
BILIRUBIN TOTAL: 0.3 mg/dL (ref 0.2–1.2)
Bilirubin, Direct: 0.1 mg/dL (ref <=0.2)
Indirect Bilirubin: 0.2 mg/dL (ref 0.2–1.2)
TOTAL PROTEIN: 6.3 g/dL (ref 6.1–8.1)

## 2015-03-04 LAB — LIPID PANEL
CHOL/HDL RATIO: 3.6 ratio (ref <=5.0)
CHOLESTEROL: 149 mg/dL (ref 125–200)
HDL: 41 mg/dL — AB (ref >=46)
LDL Cholesterol: 95 mg/dL (ref <130)
TRIGLYCERIDES: 64 mg/dL (ref <150)
VLDL: 13 mg/dL (ref <30)

## 2015-03-06 ENCOUNTER — Encounter (HOSPITAL_COMMUNITY)
Admission: RE | Admit: 2015-03-06 | Discharge: 2015-03-06 | Disposition: A | Discharge status: Home / Self Care | Payer: BLUE CROSS/BLUE SHIELD | Source: Ambulatory Visit | Attending: Cardiology | Admitting: Cardiology

## 2015-03-06 DIAGNOSIS — I200 Unstable angina: Secondary | ICD-10-CM | POA: Diagnosis not present

## 2015-03-07 ENCOUNTER — Telehealth: Payer: Self-pay | Admitting: *Deleted

## 2015-03-07 NOTE — Telephone Encounter (Signed)
Patient informed and says she is taking crestor 5 mg with no problems.

## 2015-03-07 NOTE — Telephone Encounter (Signed)
-----   Message from Jonelle SidleSamuel G McDowell, MD sent at 03/05/2015  7:11 AM EST ----- Reviewed. LFTs are normal. LDL stable at 95 and HDL better at 41. Has been able to tolerate Crestor at 5 mg daily.

## 2015-03-08 ENCOUNTER — Encounter (HOSPITAL_COMMUNITY)
Admission: RE | Admit: 2015-03-08 | Discharge: 2015-03-08 | Disposition: A | Discharge status: Home / Self Care | Payer: BLUE CROSS/BLUE SHIELD | Source: Ambulatory Visit | Attending: Cardiology | Admitting: Cardiology

## 2015-03-08 DIAGNOSIS — I200 Unstable angina: Secondary | ICD-10-CM | POA: Diagnosis not present

## 2015-03-10 ENCOUNTER — Encounter (HOSPITAL_COMMUNITY)
Admission: RE | Admit: 2015-03-10 | Discharge: 2015-03-10 | Disposition: A | Discharge status: Home / Self Care | Payer: BLUE CROSS/BLUE SHIELD | Source: Ambulatory Visit | Attending: Cardiology | Admitting: Cardiology

## 2015-03-10 DIAGNOSIS — I200 Unstable angina: Secondary | ICD-10-CM | POA: Diagnosis not present

## 2015-03-22 NOTE — Progress Notes (Signed)
Patient is discharged from [place] [facility] Pul[facility] program today, 03/10/15 with 36 sessions.  She achieved LTG of 30 minutes of aerobic exercise at max met level of 3.56.  All patient vitals are WNL.  Patient has not met with dietician.  Discharge instructions have been reviewed in detail and patient expressed an understanding of material given.  Patient plans to exercise at home and possibly join the maintenance program. Cardiac Rehab will make 1 month, 6 month and 1 year call backs.  Patient had no complaints of any abnormal S/S or pain on their exit visit.  Patient finished post walk test.

## 2015-03-22 NOTE — Progress Notes (Signed)
Cardiac Rehabilitation Program Outcomes Report   Orientation:  12/14/14 Graduate Date:  03/10/15 Discharge Date:  03/10/15 # of sessions completed: 36  Cardiologist: Hermina StaggersMcDowell Family MD:  MM Ludwig LeanMartin Class Time:  1545  A.  Exercise Program:  Tolerates exercise @ 3.62 METS for 15 minutes, Walk Test Results:  Post: 3.40 mets, Improved functional capacity  12.21 %, Improved  muscular strength  12.19 % and Improved  flexibility 6.19 %  B.  Mental Health:  Good mental attitude,  QOL improvements: overall 30.98%, Health and Functioning 37.67%, Socioeconomic 20%, Psychological and Spiritual 40.66%, Family  16.83%.  Exit PHQ 9 score 0.   C.  Education/Instruction/Skills  Accurately checks own pulse.  Rest:  92  Exercise:  121, Knows THR for exercise, Uses Perceived Exertion Scale and/or Dyspnea Scale and Attended 13 education classes  Uses Perceived Exertion Scale and/or Dyspnea Scale  D.  Nutrition/Weight Control/Body Composition:  Adherence to prescribed nutrition program: good  and Patient has lost 2.3 kg   E.  Blood Lipids    Lab Results  Component Value Date   CHOL 149 03/04/2015   HDL 41* 03/04/2015   LDLCALC 95 03/04/2015   TRIG 64 03/04/2015   CHOLHDL 3.6 03/04/2015    F.  Lifestyle Changes:  Making positive lifestyle changes and Not smoking:  Quit still smoking approx 2 cigs a day.   G.  Symptoms noted with exercise:  Asymptomatic  Report Completed By:  Doretha Sou Jiayi Lengacher RN   Comments:  This is the patients graduation note for [facility].  Patient progressed well in the program and is thinking about the maintenance program.

## 2015-05-30 ENCOUNTER — Encounter: Payer: Self-pay | Admitting: Family

## 2015-05-30 ENCOUNTER — Ambulatory Visit (INDEPENDENT_AMBULATORY_CARE_PROVIDER_SITE_OTHER): Payer: BLUE CROSS/BLUE SHIELD | Admitting: Family

## 2015-05-30 VITALS — BP 139/96 | HR 97 | Temp 98.3°F | Ht 64.0 in | Wt 201.0 lb

## 2015-05-30 DIAGNOSIS — J209 Acute bronchitis, unspecified: Secondary | ICD-10-CM

## 2015-05-30 MED ORDER — BENZONATATE 200 MG PO CAPS: 200.0000 mg | ORAL_CAPSULE | Freq: Three times a day (TID) | ORAL | Status: DC | PRN

## 2015-05-30 MED ORDER — LEVOFLOXACIN 500 MG PO TABS: 500.0000 mg | ORAL_TABLET | Freq: Every day | ORAL | Status: DC

## 2015-05-30 MED ORDER — PREDNISONE 10 MG (21) PO TBPK: 10.0000 mg | ORAL_TABLET | Freq: Every day | ORAL | Status: DC

## 2015-05-30 NOTE — Progress Notes (Signed)
Subjective:    Patient ID: Stacy Douglas, female    DOB: 03/21/1951, 65 y.o.   MRN: [id number]  Cough This is a new problem. The current episode started 1 to 4 weeks ago. The problem has been gradually worsening. The problem occurs every few minutes. The cough is productive of sputum Chilton Si). Associated symptoms include ear congestion, ear pain, myalgias, nasal congestion, postnasal drip, rhinorrhea, a sore throat, shortness of breath and wheezing. Pertinent negatives include no chills, fever or headaches. The symptoms are aggravated by lying down. She has tried rest (Hot tea) for the symptoms. The treatment provided mild relief. There is no history of asthma or COPD.      Review of Systems  Constitutional: Negative.  Negative for fever and chills.  HENT: Positive for ear pain, postnasal drip, rhinorrhea and sore throat.   Eyes: Negative.   Respiratory: Positive for cough, shortness of breath and wheezing.   Cardiovascular: Negative.  Negative for palpitations.  Gastrointestinal: Negative.   Endocrine: Negative.   Genitourinary: Negative.   Musculoskeletal: Positive for myalgias.  Neurological: Negative.  Negative for headaches.  Hematological: Negative.   Psychiatric/Behavioral: Negative.   All other systems reviewed and are negative.      Objective:   Physical Exam  Constitutional: She is oriented to person, place, and time. She appears well-developed and well-nourished. No distress.  HENT:  Head: Normocephalic and atraumatic.  Right Ear: External ear normal.  Left Ear: External ear normal.  Nasal passage erythemas with mild swelling  Oropharynx erythemas   Eyes: Pupils are equal, round, and reactive to light.  Neck: Normal range of motion. Neck supple. No thyromegaly present.  Cardiovascular: Normal rate, regular rhythm, normal heart sounds and intact distal pulses.   No murmur heard. Pulmonary/Chest: Effort normal and breath sounds normal. No respiratory distress. She  has no wheezes.  Coarse productive cough   Abdominal: Soft. Bowel sounds are normal. She exhibits no distension. There is no tenderness.  Musculoskeletal: Normal range of motion. She exhibits no edema or tenderness.  Neurological: She is alert and oriented to person, place, and time. She has normal reflexes. No cranial nerve deficit.  Skin: Skin is warm and dry.  Psychiatric: She has a normal mood and affect. Her behavior is normal. Judgment and thought content normal.  Vitals reviewed.     BP 139/96 mmHg  Pulse 97  Temp(Src) 98.3 F (36.8 C) (Oral)  Ht  (1.626 m)  Wt 201 lb (91.173 kg)  BMI 34.48 kg/m2     Assessment & Plan:  1. Acute bronchitis, unspecified organism -- Take meds as prescribed - Use a cool mist humidifier  -Use saline nose sprays frequently -Saline irrigations of the nose can be very helpful if done frequently.  * 4X daily for 1 week*  * Use of a nettie pot can be helpful with this. Follow directions with this* -Force fluids -For any cough or congestion  Use plain Mucinex- regular strength or max strength is fine   * Children- consult with Pharmacist for dosing -For fever or aces or pains- take tylenol or ibuprofen appropriate for age and weight.  * for fevers greater than 101 orally you may alternate ibuprofen and tylenol every  3 hours. -Throat lozenges if help - levofloxacin (LEVAQUIN) 500 MG tablet; Take 1 tablet (500 mg total) by mouth daily.  Dispense: 7 tablet; Refill: 0 - predniSONE (STERAPRED UNI-PAK 21 TAB) 10 MG (21) TBPK tablet; Take 1 tablet (10 mg total) by mouth  daily. As directed x 6 days  Dispense: 21 tablet; Refill: 0 - benzonatate (TESSALON) 200 MG capsule; Take 1 capsule (200 mg total) by mouth 3 (three) times daily as needed.  Dispense: 30 capsule; Refill: 1  Jannifer Rodney, FNP

## 2015-05-30 NOTE — Patient Instructions (Signed)
Acute Bronchitis Bronchitis is inflammation of the airways that extend from the windpipe into the lungs (bronchi). The inflammation often causes mucus to develop. This leads to a cough, which is the most common symptom of bronchitis.  In acute bronchitis, the condition usually develops suddenly and goes away over time, usually in a couple weeks. Smoking, allergies, and asthma can make bronchitis worse. Repeated episodes of bronchitis may cause further lung problems.  CAUSES Acute bronchitis is most often caused by the same virus that causes a cold. The virus can spread from person to person (contagious) through coughing, sneezing, and touching contaminated objects. SIGNS AND SYMPTOMS   Cough.   Fever.   Coughing up mucus.   Body aches.   Chest congestion.   Chills.   Shortness of breath.   Sore throat.  DIAGNOSIS  Acute bronchitis is usually diagnosed through a physical exam. Your health care provider will also ask you questions about your medical history. Tests, such as chest X-rays, are sometimes done to rule out other conditions.  TREATMENT  Acute bronchitis usually goes away in a couple weeks. Oftentimes, no medical treatment is necessary. Medicines are sometimes given for relief of fever or cough. Antibiotic medicines are usually not needed but may be prescribed in certain situations. In some cases, an inhaler may be recommended to help reduce shortness of breath and control the cough. A cool mist vaporizer may also be used to help thin bronchial secretions and make it easier to clear the chest.  HOME CARE INSTRUCTIONS  Get plenty of rest.   Drink enough fluids to keep your urine clear or pale yellow (unless you have a medical condition that requires fluid restriction). Increasing fluids may help thin your respiratory secretions (sputum) and reduce chest congestion, and it will prevent dehydration.   Take medicines only as directed by your health care provider.  If  you were prescribed an antibiotic medicine, finish it all even if you start to feel better.  Avoid smoking and secondhand smoke. Exposure to cigarette smoke or irritating chemicals will make bronchitis worse. If you are a smoker, consider using nicotine gum or skin patches to help control withdrawal symptoms. Quitting smoking will help your lungs heal faster.   Reduce the chances of another bout of acute bronchitis by washing your hands frequently, avoiding people with cold symptoms, and trying not to touch your hands to your mouth, nose, or eyes.   Keep all follow-up visits as directed by your health care provider.  SEEK MEDICAL CARE IF: Your symptoms do not improve after 1 week of treatment.  SEEK IMMEDIATE MEDICAL CARE IF:  You develop an increased fever or chills.   You have chest pain.   You have severe shortness of breath.  You have bloody sputum.   You develop dehydration.  You faint or repeatedly feel like you are going to pass out.  You develop repeated vomiting.  You develop a severe headache. MAKE SURE YOU:   Understand these instructions.  Will watch your condition.  Will get help right away if you are not doing well or get worse.   This information is not intended to replace advice given to you by your health care provider. Make sure you discuss any questions you have with your health care provider.   Document Released: 05/23/2004 Document Revised: 05/06/2014 Document Reviewed: 10/06/2012 Elsevier Interactive Patient Education 2016 [employer]  - Take meds as prescribed - Use a cool mist humidifier  -Use saline nose sprays frequently -Saline   irrigations of the nose can be very helpful if done frequently.  * 4X daily for 1 week*  * Use of a nettie pot can be helpful with this. Follow directions with this* -Force fluids -For any cough or congestion  Use plain Mucinex- regular strength or max strength is fine   * Children- consult with Pharmacist for  dosing -For fever or aces or pains- take tylenol or ibuprofen appropriate for age and weight.  * for fevers greater than 101 orally you may alternate ibuprofen and tylenol every  3 hours. -Throat lozenges if help    Rome Schlauch, FNP  

## 2015-08-24 ENCOUNTER — Ambulatory Visit (INDEPENDENT_AMBULATORY_CARE_PROVIDER_SITE_OTHER): Payer: BLUE CROSS/BLUE SHIELD | Admitting: Cardiology

## 2015-08-24 ENCOUNTER — Encounter: Payer: Self-pay | Admitting: Cardiology

## 2015-08-24 VITALS — BP 110/72 | HR 74 | Ht 64.0 in | Wt 209.0 lb

## 2015-08-24 DIAGNOSIS — I10 Essential (primary) hypertension: Secondary | ICD-10-CM

## 2015-08-24 DIAGNOSIS — Z720 Tobacco use: Secondary | ICD-10-CM

## 2015-08-24 DIAGNOSIS — I2510 Atherosclerotic heart disease of native coronary artery without angina pectoris: Secondary | ICD-10-CM | POA: Diagnosis not present

## 2015-08-24 DIAGNOSIS — E782 Mixed hyperlipidemia: Secondary | ICD-10-CM

## 2015-08-24 MED ORDER — METOPROLOL SUCCINATE ER 50 MG PO TB24: ORAL_TABLET | ORAL | Status: DC

## 2015-08-24 MED ORDER — PRASUGREL HCL 10 MG PO TABS: 10.0000 mg | ORAL_TABLET | Freq: Every day | ORAL | Status: DC

## 2015-08-24 NOTE — Progress Notes (Signed)
Cardiology Office Note  Date: 08/24/2015   ID: Stacy Douglas, DOB 12/30/1950, MRN [id number]  PCP: Bennie Pierini, FNP  Primary Cardiologist: Nona Dell, MD   Chief Complaint  Patient presents with  . Coronary Artery Disease    History of Present Illness: Stacy Douglas is a 65 y.o. female last seen in October 2016. She presents for a routine follow-up visit. Continues to work at [employer]. She does not report any active angina symptoms on medical therapy. She had quit smoking for about 2 months but then had a relapse in the setting of stress. She is trying to work back toward cessation again.  She completed cardiac rehabilitation last year. She is not exercising regularly now, we did discuss a walking plan.  I reviewed her medications. Cardiac regimen includes aspirin, Effient, Toprol-XL, and as needed nitroglycerin. She has a new insurance/medication plan, it was requested that she switch to a generic antiplatelet drug. She has documented Plavix resistance however based on platelet function P2Y12 testing at 244 consistent with higher rate of thromboembolic events. She therefore needs to stay on Effient.  Past Medical History  Diagnosis Date  . Coronary atherosclerosis of native coronary artery     a. VF arrest in [place] s/p BMS LAD 5/11, PTCA circumflex 6/11, LVEF 40%. b. [place] 10/2014 s/p DES to prox and mid RCA, otherwise patent LAD stent and moderate disease in the Cx, LVEF 55-65%.  . Mixed hyperlipidemia   . Myocardial infarction (HCC)     5/11, complicated by VF arrest  . Essential hypertension   . Tobacco abuse   . Obesity   . Plavix resistance     Past Surgical History  Procedure Laterality Date  . Cervical spine surgery    . Appendectomy    . Right breast biopsy    . Coronary angioplasty with stent placement    . Cardiac catheterization N/A 11/16/2014    Procedure: Left Heart Cath and Coronary Angiography;  Surgeon: Iran Ouch, MD;  [facility]: [facility]  IN[facility]ASIVE [facility];  Service: Cardiovascular;  Laterality: N/A;  . Cardiac catheterization N/A 11/16/2014    Procedure: Coronary Stent Intervention;  Surgeon: Iran Ouch, MD;  Location: [facility] INV[facility] CV [facility];  Service: Cardiovascular;  Laterality: N/A;    Current Outpatient Prescriptions  Medication Sig Dispense Refill  . aspirin EC 81 MG tablet Take 81 mg by mouth daily.    . benzonatate (TESSALON) 200 MG capsule Take 1 capsule (200 mg total) by mouth 3 (three) times daily as needed. 30 capsule 1  . metoprolol succinate (TOPROL XL) 50 MG 24 hr tablet Take 1/2 tablet by mouth daily. Take with or immediately following a meal. 45 tablet 3  . metoprolol succinate (TOPROL-XL) 50 MG 24 hr tablet TAKE 1/2 TABLET BY MOUTH EVERY DAY 30 tablet 6  . nitroGLYCERIN (NITROSTAT) 0.4 MG SL tablet Place 1 tablet (0.4 mg total) under the tongue every 5 (five) minutes x 3 doses as needed. 25 tablet 3  . prasugrel (EFFIENT) 10 MG TABS tablet Take 1 tablet (10 mg total) by mouth daily. 120 tablet 0   No current facility-administered medications for this visit.   Allergies:  Codeine   Social History: The patient  reports that she has been smoking Cigarettes.  She has smoked for the past 30 years. She quit smokeless tobacco use about 3 months ago. She reports that she does not drink alcohol or use illicit drugs.   ROS:  Please see the history  of present illness. Otherwise, complete review of systems is positive for none.  All other systems are reviewed and negative.   Physical Exam: VS:  BP 110/72 mmHg  Pulse 74  Ht 5\' 4"  (1.626 m)  Wt 209 lb (94.802 kg)  BMI 35.86 kg/m2  SpO2 97%, BMI Body mass index is 35.86 kg/(m^2).  Wt Readings from Last 3 Encounters:  08/24/15 209 lb (94.802 kg)  05/30/15 201 lb (91.173 kg)  01/31/15 203 lb (92.08 kg)    General: Patient appears comfortable at rest. HEENT: Conjunctiva and lids normal, oropharynx clear with moist mucosa. Neck: Supple, no elevated JVP or carotid  bruits, no thyromegaly. Lungs: Clear to auscultation, nonlabored breathing at rest. Cardiac: Regular rate and rhythm, no S3 or significant systolic murmur, no pericardial rub. Abdomen: Soft, nontender, no hepatomegaly, bowel sounds present, no guarding or rebound. Extremities: No pitting edema, distal pulses 2+. Skin: Warm and dry. Musculoskeletal: No kyphosis. Neuropsychiatric: Alert and oriented x3, affect grossly appropriate.  ECG: I personally reviewed the prior tracing from 11/17/2014 which showed normal sinus rhythm.  Recent Labwork: 11/15/2014: TSH 1.366 11/17/2014: BUN 10; Creatinine, Ser 0.77; Hemoglobin 13.4; Platelets 238; Potassium 3.6; Sodium 142 03/04/2015: ALT 11; AST 15     Component Value Date/Time   CHOL 149 03/04/2015 0826   TRIG 64 03/04/2015 0826   TRIG 118 11/02/2013 1428   HDL 41* 03/04/2015 0826   HDL 41 11/02/2013 1428   CHOLHDL 3.6 03/04/2015 0826   VLDL 13 03/04/2015 0826   LDLCALC 95 03/04/2015 0826   LDLCALC 103* 11/02/2013 1428    Other Studies Reviewed Today:  Cardiac catheterization 11/16/2014:  Mid RCA lesion, 60% stenosed. The lesion was not previously treated.  Acute Mrg lesion, 60% stenosed.  Prox LAD lesion, 30% stenosed.  Prox LAD to Mid LAD lesion, 20% stenosed. The lesion was previously treated with a stent (unknown type) greater than two years ago.  Ost 1st Diag lesion, 60% stenosed.  Prox Cx lesion, 40% stenosed.  Mid Cx lesion, 50% stenosed.  The left ventricular systolic function is normal.  Prox RCA lesion, 90% stenosed. There is a 0% residual stenosis post intervention. The lesion was not previously treated.  A drug-eluting stent was placed.  1. Patent LAD stent with moderate disease in the left circumflex. Severe proximal RCA and moderate mid RCA disease likely the culprit for unstable angina. 2. Normal LV systolic function and left ventricular end-diastolic pressure. 3. Successful angioplasty and drug-eluting stent  placement to the proximal and mid RCA.  Assessment and Plan:  1. Multivessel CAD most recently status post DES to the proximal and mid RCA in July of last year, prior stenting of the LAD and angioplasty of the circumflex as well. She is doing well clinically. Plan is to continue medical therapy. Would not switch from Effient to generic Plavix given documented Plavix resistance as outlined above.  2. Essential hypertension, blood pressure is well controlled today.  3. Hyperlipidemia, she has stopped Crestor. Plan is to recheck lipids for her next visit.  4. Tobacco abuse, continuing to work back toward cessation after having relapse.  Current medicines were reviewed with the patient today.  Disposition: FU with me in 6 months.   Signed, Jonelle SidleSamuel G. McDowell, MD, [facility] CenterFACC 08/24/2015 11:24 AM    [facility] [address], [place], [place] [address] Phone: [phone]([phone]; Fax: [phone]

## 2015-08-24 NOTE — Patient Instructions (Signed)
Your physician recommends that you continue on your current medications as directed. Please refer to the Current Medication list given to you today. Your physician recommends that you return for a FASTING lipid/liver profile in 6 months just before your next visit. Your physician recommends that you schedule a follow-up appointment in: 6 months. You will receive a reminder letter in the mail in about 4 months reminding you to call and schedule your appointment. If you don't receive this letter, please contact our office. 

## 2015-09-10 ENCOUNTER — Other Ambulatory Visit: Payer: Self-pay | Admitting: Cardiology

## 2015-11-20 ENCOUNTER — Encounter: Payer: Self-pay | Admitting: Nurse Practitioner

## 2015-11-24 ENCOUNTER — Other Ambulatory Visit: Payer: Self-pay | Admitting: *Deleted

## 2015-11-24 MED ORDER — PRASUGREL HCL 10 MG PO TABS: 10.0000 mg | ORAL_TABLET | Freq: Every day | ORAL | 6 refills | Status: DC

## 2016-02-20 ENCOUNTER — Encounter: Payer: BLUE CROSS/BLUE SHIELD | Admitting: *Deleted

## 2016-02-20 DIAGNOSIS — Z1231 Encounter for screening mammogram for malignant neoplasm of breast: Secondary | ICD-10-CM | POA: Diagnosis not present

## 2016-02-27 ENCOUNTER — Telehealth: Payer: Self-pay | Admitting: Nurse Practitioner

## 2016-02-27 NOTE — Telephone Encounter (Signed)
Please review

## 2016-03-04 ENCOUNTER — Ambulatory Visit: Payer: BLUE CROSS/BLUE SHIELD | Admitting: Cardiology

## 2016-03-05 ENCOUNTER — Other Ambulatory Visit: Payer: Self-pay | Admitting: Nurse Practitioner

## 2016-03-05 DIAGNOSIS — R921 Mammographic calcification found on diagnostic imaging of breast: Secondary | ICD-10-CM

## 2016-03-06 ENCOUNTER — Telehealth: Payer: Self-pay

## 2016-03-06 NOTE — Telephone Encounter (Signed)
I do not know what you mean

## 2016-03-06 NOTE — Telephone Encounter (Signed)
[facility]; Order placed and pt is aware that scheduling cannot take place until [facility]

## 2016-03-07 NOTE — Telephone Encounter (Signed)
[facility] and Harriett SineNancy from there will call pt to schedule

## 2016-03-26 ENCOUNTER — Telehealth: Payer: Self-pay

## 2016-03-26 ENCOUNTER — Ambulatory Visit (HOSPITAL_COMMUNITY)
Admission: RE | Admit: 2016-03-26 | Discharge: 2016-03-26 | Disposition: A | Discharge status: Home / Self Care | Payer: BLUE CROSS/BLUE SHIELD | Source: Ambulatory Visit | Attending: Nurse Practitioner | Admitting: Nurse Practitioner

## 2016-03-26 DIAGNOSIS — R921 Mammographic calcification found on diagnostic imaging of breast: Secondary | ICD-10-CM | POA: Diagnosis not present

## 2016-03-26 NOTE — Telephone Encounter (Signed)
Not sure how to order.

## 2016-03-27 ENCOUNTER — Other Ambulatory Visit: Payer: Self-pay | Admitting: Nurse Practitioner

## 2016-03-27 DIAGNOSIS — R921 Mammographic calcification found on diagnostic imaging of breast: Secondary | ICD-10-CM

## 2016-03-27 NOTE — Telephone Encounter (Signed)
Pt is scheduled fort 04/01/16 at 10:10 and is aware of appointment date/time

## 2016-04-01 ENCOUNTER — Ambulatory Visit
Admission: RE | Admit: 2016-04-01 | Discharge: 2016-04-01 | Disposition: A | Discharge status: Home / Self Care | Payer: BLUE CROSS/BLUE SHIELD | Source: Ambulatory Visit | Attending: Nurse Practitioner | Admitting: Nurse Practitioner

## 2016-04-01 DIAGNOSIS — R921 Mammographic calcification found on diagnostic imaging of breast: Secondary | ICD-10-CM | POA: Diagnosis not present

## 2016-04-01 DIAGNOSIS — D242 Benign neoplasm of left breast: Secondary | ICD-10-CM | POA: Diagnosis not present

## 2016-04-10 NOTE — Progress Notes (Signed)
Cardiology Office Note  Date: 04/11/2016   ID: Stacy RalphsLisa S Nusser, DOB 11-Dec-1950, MRN [id number]  PCP: Bennie PieriniMary-Margaret Martin, FNP  Primary Cardiologist: Nona DellSamuel McDowell, MD   Chief Complaint  Patient presents with  . Coronary Artery Disease    History of Present Illness: Stacy Douglas is a 65 y.o. female last seen in April. She presents for a routine follow-up visit. Reports no angina symptoms, states that she feels well. Still under a lot of stress at work. She has been active with her grandkids, platelet kickball with them recently.  She is due for follow-up lipids. Has prior history of intolerance to Crestor.  I reviewed her ECG today which shows sinus rhythm with low voltage. Her current cardiac regimen includes aspirin, Toprol-XL, as needed nitroglycerin glycerin, and Effient. She is a year and a half out from her prior coronary intervention. We have discussed trying to come off of Effient at this point and continue aspirin.  Past Medical History:  Diagnosis Date  . Coronary atherosclerosis of native coronary artery    a. [place] s/p BMS LAD 5/11, PTCA circumflex 6/11, LVEF 40%. b[place] 10[place] s/p DES to prox and mid RCA, otherwise patent LAD stent and moderate disease in the Cx, LVEF 55-65%.  . Essential hypertension   . Mixed hyperlipidemia   . Myocardial infarction    5/11, complicated by VF arrest  . Obesity   . Plavix resistance   . Tobacco abuse     Past Surgical History:  Procedure Laterality Date  . APPENDECTOMY    . CARDIAC CATHETERIZATION N/A 11/16/2014   Procedure: Left Heart Cath and Coronary Angiography;  Surgeon: Iran OuchMuhammad A Arida, MD;  Location: [facility];  Service: Cardiovascular;  Laterality: N/A;  . CARDIAC CATHETERIZATION N/A 11/16/2014   Procedure: Coronary Stent Intervention;  Surgeon: Iran OuchMuhammad A Arida, MD;  Location: [facility];  Service: Cardiovascular;  Laterality: N/A;  . CERVICAL SPINE SURGERY    . CORONARY ANGIOPLASTY  WITH STENT PLACEMENT    . Right breast biopsy      Current Outpatient Prescriptions  Medication Sig Dispense Refill  . aspirin EC 81 MG tablet Take 81 mg by mouth daily.    . metoprolol succinate (TOPROL XL) 50 MG 24 hr tablet Take 1/2 tablet by mouth daily. Take with or immediately following a meal. 45 tablet 3  . nitroGLYCERIN (NITROSTAT) 0.4 MG SL tablet Place 1 tablet (0.4 mg total) under the tongue every 5 (five) minutes x 3 doses as needed. 25 tablet 3   No current facility-administered medications for this visit.    Allergies:  Codeine   Social History: The patient  reports that she has been smoking Cigarettes.  She started smoking about 51 years ago. She has been smoking about 0.50 packs per day. She quit smokeless tobacco use about 10 months ago. She reports that she does not drink alcohol or use drugs.   ROS:  Please see the history of present illness. Otherwise, complete review of systems is positive for none.  All other systems are reviewed and negative.   Physical Exam: VS:  BP 117/83   Pulse 79   Ht 5\' 4"  (1.626 m)   Wt 214 lb (97.1 kg)   BMI 36.73 kg/m , BMI Body mass index is 36.73 kg/m.  Wt Readings from Last 3 Encounters:  04/11/16 214 lb (97.1 kg)  08/24/15 209 lb (94.8 kg)  05/30/15 201 lb (91.2 kg)  General: Patient appears comfortable at rest. HEENT: Conjunctiva and lids normal, oropharynx clear with moist mucosa. Neck: Supple, no elevated JVP or carotid bruits, no thyromegaly. Lungs: Clear to auscultation, nonlabored breathing at rest. Cardiac: Regular rate and rhythm, no S3 or significant systolic murmur, no pericardial rub. Abdomen: Soft, nontender, no hepatomegaly, bowel sounds present, no guarding or rebound. Extremities: No pitting edema, distal pulses 2+. Skin: Warm and dry. Musculoskeletal: No kyphosis. Neuropsychiatric: Alert and oriented x3, affect grossly appropriate.  ECG: I personally reviewed the tracing from 11/17/2014 which showed  sinus rhythm.  Recent Labwork:    Component Value Date/Time   CHOL 149 03/04/2015 0826   TRIG 64 03/04/2015 0826   TRIG 118 11/02/2013 1428   HDL 41 (L) 03/04/2015 0826   HDL 41 11/02/2013 1428   CHOLHDL 3.6 03/04/2015 0826   VLDL 13 03/04/2015 0826   LDLCALC 95 03/04/2015 0826   LDLCALC 103 (H) 11/02/2013 1428    Other Studies Reviewed Today:  Cardiac catheterization 11/16/2014:  Mid RCA lesion, 60% stenosed. The lesion was not previously treated.  Acute Mrg lesion, 60% stenosed.  Prox LAD lesion, 30% stenosed.  Prox LAD to Mid LAD lesion, 20% stenosed. The lesion was previously treated with a stent (unknown type) greater than two years ago.  Ost 1st Diag lesion, 60% stenosed.  Prox Cx lesion, 40% stenosed.  Mid Cx lesion, 50% stenosed.  The left ventricular systolic function is normal.  Prox RCA lesion, 90% stenosed. There is a 0% residual stenosis post intervention. The lesion was not previously treated.  A drug-eluting stent was placed.  1. Patent LAD stent with moderate disease in the left circumflex. Severe proximal RCA and moderate mid RCA disease likely the culprit for unstable angina. 2. Normal LV systolic function and left ventricular end-diastolic pressure. 3. Successful angioplasty and drug-eluting stent placement to the proximal and mid RCA.  Assessment and Plan:  1.  Symptomatically stable CAD status post DES to the proximal and mid RCA in July 2016 as well as prior stenting of the LAD and angioplasty of the circumflex. She is doing well at this time. Will plan to stop Effient, otherwise continue her baseline regimen.  2. Tobacco abuse. She is trying to quit smoking completely.  3. Essential hypertension, blood pressure is well controlled today.  4. Hyperlipidemia with prior intolerance to Crestor. Follow-up lipid panel.  Current medicines were reviewed with the patient today.   Orders Placed This Encounter  Procedures  . Lipid panel  . EKG  12-Lead    Disposition: Follow-up in 6 months.  Signed, Jonelle SidleSamuel G. McDowell, MD, [facility]FACC 04/11/2016 9:16 AM    [facility] [address], [place], [place] [address] Phone: [phone]; Fax: [phone]

## 2016-04-11 ENCOUNTER — Ambulatory Visit (INDEPENDENT_AMBULATORY_CARE_PROVIDER_SITE_OTHER): Payer: BLUE CROSS/BLUE SHIELD | Admitting: Cardiology

## 2016-04-11 ENCOUNTER — Encounter: Payer: Self-pay | Admitting: Cardiology

## 2016-04-11 VITALS — BP 117/83 | HR 79 | Ht 64.0 in | Wt 214.0 lb

## 2016-04-11 DIAGNOSIS — E785 Hyperlipidemia, unspecified: Secondary | ICD-10-CM | POA: Diagnosis not present

## 2016-04-11 DIAGNOSIS — I2510 Atherosclerotic heart disease of native coronary artery without angina pectoris: Secondary | ICD-10-CM | POA: Diagnosis not present

## 2016-04-11 DIAGNOSIS — I10 Essential (primary) hypertension: Secondary | ICD-10-CM

## 2016-04-11 DIAGNOSIS — E782 Mixed hyperlipidemia: Secondary | ICD-10-CM | POA: Diagnosis not present

## 2016-04-11 DIAGNOSIS — Z720 Tobacco use: Secondary | ICD-10-CM

## 2016-04-11 MED ORDER — NITROGLYCERIN 0.4 MG SL SUBL: 0.4000 mg | SUBLINGUAL_TABLET | SUBLINGUAL | 3 refills | Status: DC | PRN

## 2016-04-11 NOTE — Patient Instructions (Signed)
.  Medication Instructions:   Stop Effient.  Continue all other current medications.  Labwork:  Lipids - order given today.  Reminder:  Nothing to eat or drink after 12 midnight prior to labs.  Office will contact with results via phone or letter.    Testing/Procedures: none  Follow-Up: Your physician wants you to follow up in: 6 months.  You will receive a reminder letter in the mail one-two months in advance.  If you don't receive a letter, please call our office to schedule the follow up appointment   Any Other Special Instructions Will Be Listed Below (If Applicable).  If you need a refill on your cardiac medications before your next appointment, please call your pharmacy.

## 2016-06-11 ENCOUNTER — Encounter: Payer: Self-pay | Admitting: *Deleted

## 2016-07-09 ENCOUNTER — Other Ambulatory Visit: Payer: Self-pay | Admitting: *Deleted

## 2016-07-09 ENCOUNTER — Encounter: Payer: Self-pay | Admitting: *Deleted

## 2016-07-09 DIAGNOSIS — E785 Hyperlipidemia, unspecified: Secondary | ICD-10-CM

## 2016-07-30 ENCOUNTER — Encounter: Payer: Self-pay | Admitting: *Deleted

## 2016-07-31 ENCOUNTER — Other Ambulatory Visit: Payer: Self-pay | Admitting: Cardiology

## 2016-07-31 DIAGNOSIS — E785 Hyperlipidemia, unspecified: Secondary | ICD-10-CM | POA: Diagnosis not present

## 2016-07-31 LAB — LIPID PANEL
CHOL/HDL RATIO: 4.1 ratio (ref <5.0)
Cholesterol: 158 mg/dL (ref <200)
HDL: 39 mg/dL — AB (ref >50)
LDL CALC: 99 mg/dL (ref <100)
TRIGLYCERIDES: 100 mg/dL (ref <150)
VLDL: 20 mg/dL (ref <30)

## 2016-08-02 ENCOUNTER — Telehealth: Payer: Self-pay | Admitting: *Deleted

## 2016-08-02 NOTE — Telephone Encounter (Signed)
Patient informed. 

## 2016-08-02 NOTE — Telephone Encounter (Signed)
-----   Message from Jonelle Sidle, MD sent at 08/02/2016  1:59 PM EDT ----- Results reviewed. No major change, LDL 99 and total cholesterol 158. Has Crestor intolerance. A copy of this test should be forwarded to [facility], FNP.

## 2016-08-03 ENCOUNTER — Encounter (HOSPITAL_COMMUNITY): Payer: Self-pay | Admitting: Emergency Medicine

## 2016-08-03 ENCOUNTER — Emergency Department (HOSPITAL_COMMUNITY): Payer: BLUE CROSS/BLUE SHIELD

## 2016-08-03 ENCOUNTER — Observation Stay (HOSPITAL_COMMUNITY)
Admission: EM | Admit: 2016-08-03 | Discharge: 2016-08-05 | Disposition: A | Discharge status: Home / Self Care | Payer: BLUE CROSS/BLUE SHIELD | Attending: Internal Medicine | Admitting: Internal Medicine

## 2016-08-03 ENCOUNTER — Observation Stay (HOSPITAL_COMMUNITY): Payer: BLUE CROSS/BLUE SHIELD

## 2016-08-03 DIAGNOSIS — Z7982 Long term (current) use of aspirin: Secondary | ICD-10-CM | POA: Diagnosis not present

## 2016-08-03 DIAGNOSIS — R072 Precordial pain: Secondary | ICD-10-CM | POA: Insufficient documentation

## 2016-08-03 DIAGNOSIS — I10 Essential (primary) hypertension: Secondary | ICD-10-CM | POA: Insufficient documentation

## 2016-08-03 DIAGNOSIS — R1013 Epigastric pain: Secondary | ICD-10-CM | POA: Diagnosis not present

## 2016-08-03 DIAGNOSIS — Z79899 Other long term (current) drug therapy: Secondary | ICD-10-CM | POA: Diagnosis not present

## 2016-08-03 DIAGNOSIS — K529 Noninfective gastroenteritis and colitis, unspecified: Secondary | ICD-10-CM | POA: Diagnosis not present

## 2016-08-03 DIAGNOSIS — F17210 Nicotine dependence, cigarettes, uncomplicated: Secondary | ICD-10-CM | POA: Diagnosis not present

## 2016-08-03 DIAGNOSIS — R112 Nausea with vomiting, unspecified: Secondary | ICD-10-CM | POA: Diagnosis not present

## 2016-08-03 DIAGNOSIS — R0602 Shortness of breath: Secondary | ICD-10-CM | POA: Diagnosis not present

## 2016-08-03 DIAGNOSIS — R079 Chest pain, unspecified: Secondary | ICD-10-CM | POA: Diagnosis not present

## 2016-08-03 DIAGNOSIS — I2510 Atherosclerotic heart disease of native coronary artery without angina pectoris: Secondary | ICD-10-CM | POA: Diagnosis not present

## 2016-08-03 DIAGNOSIS — R42 Dizziness and giddiness: Secondary | ICD-10-CM | POA: Insufficient documentation

## 2016-08-03 DIAGNOSIS — R61 Generalized hyperhidrosis: Secondary | ICD-10-CM | POA: Insufficient documentation

## 2016-08-03 DIAGNOSIS — R1110 Vomiting, unspecified: Secondary | ICD-10-CM | POA: Diagnosis not present

## 2016-08-03 DIAGNOSIS — F17200 Nicotine dependence, unspecified, uncomplicated: Secondary | ICD-10-CM | POA: Diagnosis present

## 2016-08-03 LAB — URINALYSIS, ROUTINE W REFLEX MICROSCOPIC
Bilirubin Urine: NEGATIVE
Glucose, UA: NEGATIVE mg/dL
Hgb urine dipstick: NEGATIVE
KETONES UR: 5 mg/dL — AB
Nitrite: NEGATIVE
PH: 6 (ref 5.0–8.0)
PROTEIN: NEGATIVE mg/dL
Specific Gravity, Urine: 1.019 (ref 1.005–1.030)

## 2016-08-03 LAB — CBC WITH DIFFERENTIAL/PLATELET
BASOS PCT: 0 %
Basophils Absolute: 0.1 10*3/uL (ref 0.0–0.1)
EOS ABS: 0.1 10*3/uL (ref 0.0–0.7)
EOS PCT: 1 %
HCT: 45.2 % (ref 36.0–46.0)
HEMOGLOBIN: 15.7 g/dL — AB (ref 12.0–15.0)
LYMPHS ABS: 1.9 10*3/uL (ref 0.7–4.0)
Lymphocytes Relative: 16 %
MCH: 33.2 pg (ref 26.0–34.0)
MCHC: 34.7 g/dL (ref 30.0–36.0)
MCV: 95.6 fL (ref 78.0–100.0)
MONOS PCT: 6 %
Monocytes Absolute: 0.7 10*3/uL (ref 0.1–1.0)
NEUTROS PCT: 77 %
Neutro Abs: 9.4 10*3/uL — ABNORMAL HIGH (ref 1.7–7.7)
Platelets: 319 10*3/uL (ref 150–400)
RBC: 4.73 MIL/uL (ref 3.87–5.11)
RDW: 13.3 % (ref 11.5–15.5)
WBC: 12.1 10*3/uL — ABNORMAL HIGH (ref 4.0–10.5)

## 2016-08-03 LAB — BASIC METABOLIC PANEL
Anion gap: 11 (ref 5–15)
BUN: 16 mg/dL (ref 6–20)
CALCIUM: 9.2 mg/dL (ref 8.9–10.3)
CHLORIDE: 107 mmol/L (ref 101–111)
CO2: 24 mmol/L (ref 22–32)
Creatinine, Ser: 0.92 mg/dL (ref 0.44–1.00)
GFR calc non Af Amer: >60 mL/min (ref >60)
Glucose, Bld: 172 mg/dL — ABNORMAL HIGH (ref 65–99)
Potassium: 3.7 mmol/L (ref 3.5–5.1)
SODIUM: 142 mmol/L (ref 135–145)

## 2016-08-03 LAB — I-STAT TROPONIN, ED: TROPONIN I, POC: 0.01 ng/mL (ref 0.00–0.08)

## 2016-08-03 LAB — HEPATIC FUNCTION PANEL
ALBUMIN: 4.2 g/dL (ref 3.5–5.0)
ALT: 17 U/L (ref 14–54)
AST: 21 U/L (ref 15–41)
Alkaline Phosphatase: 73 U/L (ref 38–126)
BILIRUBIN DIRECT: 0.1 mg/dL (ref 0.1–0.5)
BILIRUBIN INDIRECT: 0.5 mg/dL (ref 0.3–0.9)
Total Bilirubin: 0.6 mg/dL (ref 0.3–1.2)
Total Protein: 7.6 g/dL (ref 6.5–8.1)

## 2016-08-03 LAB — LIPASE, BLOOD: LIPASE: 17 U/L (ref 11–51)

## 2016-08-03 LAB — TROPONIN I

## 2016-08-03 MED ORDER — ASPIRIN EC 81 MG PO TBEC
81.0000 mg | DELAYED_RELEASE_TABLET | Freq: Every day | ORAL | Status: DC
  Administered 2016-08-04 – 2016-08-05 (×2): 81 mg via ORAL
  Filled 2016-08-03 (×2): qty 1

## 2016-08-03 MED ORDER — HYDROCODONE-ACETAMINOPHEN 5-325 MG PO TABS
1.0000 | ORAL_TABLET | Freq: Four times a day (QID) | ORAL | Status: DC | PRN
  Administered 2016-08-04: 1 via ORAL
  Filled 2016-08-03: qty 1

## 2016-08-03 MED ORDER — METOPROLOL SUCCINATE ER 25 MG PO TB24
25.0000 mg | ORAL_TABLET | Freq: Every day | ORAL | Status: DC
  Administered 2016-08-04 – 2016-08-05 (×2): 25 mg via ORAL
  Filled 2016-08-03 (×2): qty 1

## 2016-08-03 MED ORDER — PROMETHAZINE HCL 25 MG RE SUPP
25.0000 mg | Freq: Four times a day (QID) | RECTAL | Status: DC | PRN
  Administered 2016-08-04: 25 mg via RECTAL
  Filled 2016-08-03: qty 1

## 2016-08-03 MED ORDER — ONDANSETRON HCL 4 MG/2ML IJ SOLN
4.0000 mg | Freq: Four times a day (QID) | INTRAMUSCULAR | Status: DC | PRN
  Administered 2016-08-03 – 2016-08-04 (×2): 4 mg via INTRAVENOUS
  Filled 2016-08-03 (×2): qty 2

## 2016-08-03 MED ORDER — ONDANSETRON HCL 4 MG/2ML IJ SOLN
4.0000 mg | Freq: Once | INTRAMUSCULAR | Status: AC
  Administered 2016-08-03: 4 mg via INTRAVENOUS
  Filled 2016-08-03: qty 2

## 2016-08-03 MED ORDER — NITROGLYCERIN 0.4 MG SL SUBL
0.4000 mg | SUBLINGUAL_TABLET | SUBLINGUAL | Status: DC | PRN
Start: 2016-08-03 — End: 2016-08-05

## 2016-08-03 MED ORDER — ONDANSETRON HCL 4 MG/2ML IJ SOLN: 4.0000 mg | Freq: Once | INTRAMUSCULAR | Status: DC

## 2016-08-03 MED ORDER — ALBUTEROL SULFATE (2.5 MG/3ML) 0.083% IN NEBU: 2.5000 mg | INHALATION_SOLUTION | RESPIRATORY_TRACT | Status: DC | PRN

## 2016-08-03 MED ORDER — KCL IN DEXTROSE-NACL 20-5-0.9 MEQ/L-%-% IV SOLN
INTRAVENOUS | Status: DC
Start: 2016-08-03 — End: 2016-08-04
  Administered 2016-08-03: 15:00:00 via INTRAVENOUS
  Filled 2016-08-03 (×4): qty 1000

## 2016-08-03 MED ORDER — PANTOPRAZOLE SODIUM 40 MG IV SOLR
40.0000 mg | Freq: Two times a day (BID) | INTRAVENOUS | Status: DC
  Administered 2016-08-03 – 2016-08-05 (×5): 40 mg via INTRAVENOUS
  Filled 2016-08-03 (×5): qty 40

## 2016-08-03 MED ORDER — MORPHINE SULFATE (PF) 4 MG/ML IV SOLN
4.0000 mg | Freq: Once | INTRAVENOUS | Status: AC
  Administered 2016-08-03: 4 mg via INTRAVENOUS
  Filled 2016-08-03: qty 1

## 2016-08-03 MED ORDER — SODIUM CHLORIDE 0.9 % IV SOLN: INTRAVENOUS | Status: DC

## 2016-08-03 MED ORDER — BISACODYL 10 MG RE SUPP: 10.0000 mg | Freq: Every day | RECTAL | Status: DC | PRN

## 2016-08-03 MED ORDER — HEPARIN SODIUM (PORCINE) 5000 UNIT/ML IJ SOLN
5000.0000 [IU] | Freq: Three times a day (TID) | INTRAMUSCULAR | Status: DC
  Administered 2016-08-03 – 2016-08-05 (×5): 5000 [IU] via SUBCUTANEOUS
  Filled 2016-08-03 (×5): qty 1

## 2016-08-03 MED ORDER — SODIUM CHLORIDE 0.9 % IV BOLUS (SEPSIS)
500.0000 mL | Freq: Once | INTRAVENOUS | Status: AC
  Administered 2016-08-03: 500 mL via INTRAVENOUS

## 2016-08-03 NOTE — ED Provider Notes (Signed)
The patient is a 66 year old female with multiple prior coronary events including a myocardial infarction 7 years ago as well as stenting 2 years ago, presents today after recurrent left-sided chest pain associated with vomiting. The chest pain has continued but is mild, there is no shortness of breath, no edema, no JVD. EKG is unremarkable, troponin is unremarkable, lungs and heart sounds are clear, the patient will need to be admitted for further evaluation with her significant history for coronary disease.  Medical screening examination/treatment/procedure(s) were conducted as a shared visit with non-physician practitioner(s) and myself.  I personally evaluated the patient during the encounter.  Clinical Impression:   Final diagnoses:  Nausea and vomiting, intractability of vomiting not specified, unspecified vomiting type  Chest pain, unspecified type       EKG Interpretation  Date/Time:  Saturday August 03 2016 12:56:28 EDT Ventricular Rate:  88 PR Interval:    QRS Duration: 84 QT Interval:  375 QTC Calculation: 454 R Axis:   56 Text Interpretation:  Sinus rhythm Low voltage, precordial leads Borderline T abnormalities, anterior leads since last tracing no significant change Confirmed by Hyacinth Meeker  MD, Jamaia Brum ([id number]) on 08/03/2016 2:01:26 PM         Eber Hong, MD 08/03/16 [id number]

## 2016-08-03 NOTE — ED Triage Notes (Signed)
Pt reports onset of vomiting at 0500 this morning with chest pain.

## 2016-08-03 NOTE — ED Provider Notes (Signed)
[facility] DEPT Provider Note   CSN: [id number] Arrival date & time: 08/03/16  1245     History   Chief Complaint Chief Complaint  Patient presents with  . Chest Pain  . Emesis    HPI Stacy Douglas is a 66 y.o. female who presents with vomiting and intermittent CP. Initially, patient was just experiencing vomiting that began this morning at 0500. Initially looked like the food she was eating, now appears more green in color. No blood noted. Patient reports that mid sternal CP started a few hours after onset of vomiting. She describes it as a "pinch." No alleviating factors. CP is worsened with episodes of vomiting. She took 81 mg of ASA prior to arrival. She did not try to take her nitro. She reports associated diaphoresis and lightheadedness when vomiting. No fevers, abdominal pain, SOB.   The history is provided by the patient.    Past Medical History:  Diagnosis Date  . Coronary atherosclerosis of native coronary artery    a. VF arrest in [place] s/p BMS LAD 5/11, PTCA circumflex 6/11, LVEF 40%. b. [place] 10/2014 s/p DES to prox and mid RCA, otherwise patent LAD stent and moderate disease in the Cx, LVEF 55-65%.  . Essential hypertension   . Mixed hyperlipidemia   . Myocardial infarction    5/11, complicated by VF arrest  . Obesity   . Plavix resistance   . Tobacco abuse     Patient Active Problem List   Diagnosis Date Noted  . Gastroenteritis 08/03/2016  . Unstable angina (HCC) 11/15/2014  . Essential hypertension 11/15/2014  . Coronary atherosclerosis of native coronary artery 02/20/2011  . Hyperlipidemia with target LDL less than 100 02/20/2011  . Tobacco use disorder 02/20/2011    Past Surgical History:  Procedure Laterality Date  . APPENDECTOMY    . CARDIAC CATHETERIZATION N/A 11/16/2014   Procedure: Left Heart Cath and Coronary Angiography;  Surgeon: Iran Ouch, MD;  Location: [facility];  Service: Cardiovascular;  Laterality: N/A;  . CARDIAC  CATHETERIZATION N/A 11/16/2014   Procedure: Coronary Stent Intervention;  Surgeon: Iran Ouch, MD;  Location: [facility];  Service: Cardiovascular;  Laterality: N/A;  . CERVICAL SPINE SURGERY    . CORONARY ANGIOPLASTY WITH STENT PLACEMENT    . Right breast biopsy      OB History    No data available       Home Medications    Prior to Admission medications   Medication Sig Start Date End Date Taking? Authorizing Provider  aspirin EC 81 MG tablet Take 81 mg by mouth daily.   Yes Historical Provider, MD  metoprolol succinate (TOPROL XL) 50 MG 24 hr tablet Take 1/2 tablet by mouth daily. Take with or immediately following a meal. 08/24/15  Yes Jonelle Sidle, MD  nitroGLYCERIN (NITROSTAT) 0.4 MG SL tablet Place 1 tablet (0.4 mg total) under the tongue every 5 (five) minutes x 3 doses as needed. 04/11/16  Yes Jonelle Sidle, MD  penicillin v potassium (VEETID) 500 MG tablet Take 1 tablet by mouth 2 (two) times daily. 07/31/16  Yes Historical Provider, MD    Family History Family History  Problem Relation Age of Onset  . Coronary artery disease Father     MI age 71  . Diverticulitis Mother     Social History Social History  Substance Use Topics  . Smoking status: Current Every Day Smoker    Packs/day: 0.50    Types: Cigarettes  Start date: 08/10/1964  . Smokeless tobacco: Former Neurosurgeon    Quit date: 05/24/2015     Comment: 2 ciggs per day  . Alcohol use No     Allergies   Codeine   Review of Systems Review of Systems  Constitutional: Positive for diaphoresis. Negative for chills and fever.  HENT: Negative for congestion, rhinorrhea and sore throat.   Eyes: Negative for visual disturbance.  Respiratory: Negative for cough and shortness of breath.   Cardiovascular: Positive for chest pain.  Gastrointestinal: Positive for vomiting. Negative for abdominal pain, blood in stool, diarrhea and nausea.  Genitourinary: Negative for dysuria and hematuria.    Musculoskeletal: Negative for back pain and neck pain.  Skin: Negative for rash.  Neurological: Positive for light-headedness. Negative for dizziness, weakness, numbness and headaches.  Psychiatric/Behavioral: Negative for confusion.  All other systems reviewed and are negative.    Physical Exam Updated Vital Signs BP 116/65   Pulse 76   Temp 97.7 F (36.5 C) (Oral)   Resp 11   Ht  (1.626 m)   Wt 97.5 kg   SpO2 96%   BMI 36.90 kg/m   Physical Exam  Constitutional: She is oriented to person, place, and time. She appears well-developed and well-nourished.  HENT:  Head: Normocephalic and atraumatic.  Mouth/Throat: Oropharynx is clear and moist. Mucous membranes are dry.  Eyes: Conjunctivae, EOM and lids are normal. Pupils are equal, round, and reactive to light.  Neck: Full passive range of motion without pain.  Cardiovascular: Normal rate, regular rhythm, normal heart sounds and normal pulses.  Exam reveals no gallop and no friction rub.   No murmur heard. Pulmonary/Chest: Effort normal and breath sounds normal.  Abdominal: Soft. Normal appearance. There is tenderness in the epigastric area. There is no rigidity and no guarding.  Musculoskeletal: Normal range of motion.  Neurological: She is alert and oriented to person, place, and time.  Follows commands  Moving all extremities bilaterally   Skin: Skin is warm and dry. Capillary refill takes less than 2 seconds.  Cool lower extremities but intact distal pulses  Psychiatric: She has a normal mood and affect. Her speech is normal.  Nursing note and vitals reviewed.    ED Treatments / Results  Labs (all labs ordered are listed, but only abnormal results are displayed) Labs Reviewed  CBC WITH DIFFERENTIAL/PLATELET - Abnormal; Notable for the following:       Result Value   WBC 12.1 (*)    Hemoglobin 15.7 (*)    Neutro Abs 9.4 (*)    All other components within normal limits  BASIC METABOLIC PANEL - Abnormal;  Notable for the following:    Glucose, Bld 172 (*)    All other components within normal limits  HEPATIC FUNCTION PANEL  LIPASE, BLOOD  TROPONIN I  URINALYSIS, ROUTINE W REFLEX MICROSCOPIC  TROPONIN I  I-STAT TROPOININ, ED    EKG  EKG Interpretation  Date/Time:  Saturday August 03 2016 12:56:28 EDT Ventricular Rate:  88 PR Interval:    QRS Duration: 84 QT Interval:  375 QTC Calculation: 454 R Axis:   56 Text Interpretation:  Sinus rhythm Low voltage, precordial leads Borderline T abnormalities, anterior leads since last tracing no significant change Confirmed by Hyacinth Meeker  MD, BRIAN ([id number]) on 08/03/2016 2:01:26 PM       Radiology Dg Chest Port 1 View  Result Date: 08/03/2016 CLINICAL DATA:  Vomiting being this morning with chest pain and shortness of breath. Previous cardiac stents.  EXAM: PORTABLE CHEST 1 VIEW COMPARISON:  11/15/2014 FINDINGS: Lungs are adequately inflated without airspace consolidation or effusion. Cardiomediastinal silhouette is within normal. Remaining bones soft tissues are unremarkable. IMPRESSION: No acute cardiopulmonary disease. Electronically Signed   By: Elberta Fortis M.D.   On: 08/03/2016 14:00   Dg Abd Portable 1v  Result Date: 08/03/2016 CLINICAL DATA:  66 year old female with a history of vomiting and chest pain EXAM: PORTABLE ABDOMEN - 1 VIEW COMPARISON:  None. FINDINGS: Limited plain film with exclusion of portions of the abdomen. Gas within stomach. No significant gas within small bowel. No abnormal distention of small bowel or colon. Mild stool burden. Degenerative changes of the spine. No displaced fracture. No radiopaque foreign body.  No unexpected calcifications. IMPRESSION: Unremarkable bowel gas pattern. No significant stool burden. Electronically Signed   By: Gilmer Mor D.O.   On: 08/03/2016 15:29    Procedures Procedures (including critical care time)  Medications Ordered in ED Medications  pantoprazole (PROTONIX) injection 40 mg (not  administered)  ondansetron (ZOFRAN) injection 4 mg (not administered)  promethazine (PHENERGAN) suppository 25 mg (not administered)  dextrose 5 % and 0.9 % NaCl with KCl 20 mEq/L infusion (not administered)  sodium chloride 0.9 % bolus 500 mL (0 mLs Intravenous Stopped 08/03/16 1551)  ondansetron (ZOFRAN) injection 4 mg (4 mg Intravenous Given 08/03/16 1326)  morphine 4 MG/ML injection 4 mg (4 mg Intravenous Given 08/03/16 1327)     Initial Impression / Assessment and Plan / ED Course  I have reviewed the triage vital signs and the nursing notes.  Pertinent labs & imaging results that were available during my care of the patient were reviewed by me and considered in my medical decision making (see chart for details).    66 yo F with significant cardiac history presents for vomiting that began at 0500 followed shortly by midsternal CP that began later. Consider ACS vs infectious etiology. Plan to check basic labs including CBC, BMP, UA, Trop, EKG, CXR. IVF started. Will give morphine and zofran for symptomatic relief.   Labs and imaging reviewed. EKG has some mild new T wave abnormalities from comparison to 04/11/16 EKG. No ST elevations noted. Initial Trop negative. CBC with leukocytosis, could be related to infectious pathology. Urine still pending.   Patient reports some improvement of pain with morphine. No nausea or episodes of vomiting presently. Labs and imaging reviewed with patient. Given significant cardiac history, plan to admit for further CP rule out.   2:38 PM: Hospitalist consult placed.   Discussed with hospitalist. Recommends additional workup for further rule out of infectious causes.   Patient updated with plan. Will be admitted for further workup.   Final Clinical Impressions(s) / ED Diagnoses   Final diagnoses:  Nausea and vomiting, intractability of vomiting not specified, unspecified vomiting type  Chest pain, unspecified type    New Prescriptions New  Prescriptions   No medications on file     Maxwell Caul, PA-C 08/03/16 1615    Maxwell Caul, PA-C 08/03/16 1616    Eber Hong, MD 08/03/16 1952

## 2016-08-03 NOTE — ED Notes (Signed)
Pt family approached nurses station stating that PA inquired about blood in emesis/cough. Upon assessment, minimal blood tinged saliva noted to emesis bag. PA informed. No other orders at this time.

## 2016-08-03 NOTE — H&P (Signed)
[facility] H&P   Patient Demographics:    Stacy Douglas, is a 66 y.o. female  MRN: [id number]   DOB - [birth year]  Admit Date - 08/03/2016  Outpatient Primary MD for the patient is [name], FNP  Outpatient Specialists: Dr Diona Browner, Dr. Kirke Corin  Patient coming from: Home  Chief Complaint  Patient presents with  . Chest Pain  . Emesis      HPI:    Stacy Douglas  is a 66 y.o. female,  With H/O CAD, 3 stents last in 2016, HTN, Dyslipidemia, Plavix resistance, morbid obesity, who was in great health till she started having N&V this am, she had about 10 -12 episodes of emesis, minimal epigastric pain which is dull and Her episodes of emesis, minimal to no discomfort at rest at all, also after multiple episodes of emesis she developed some substernal burning chest pain which is now completely gone. She is currently symptom-free after she received some Zofran, she denies any fever chills or diarrhea, she ate a burger last night at a restaurant, no other sick contacts. No history of colitis or GI shoes. She is feeling relatively well now.     Review of systems:    In addition to the HPI above,   No Fever-chills, No Headache, No changes with Vision or hearing, No problems swallowing food or Liquids, No Chest pain, Cough or Shortness of Breath, No Abdominal pain, No Nausea or Vommitting, Bowel movements are regular, No Blood in stool or Urine, No dysuria, No new skin rashes or bruises, No new joints pains-aches,  No new weakness, tingling, numbness in any extremity, No recent weight gain or loss, No polyuria, polydypsia or polyphagia, No significant Mental Stressors.  A full 10 point Review of Systems was done,  except as stated above, all other Review of Systems were negative.   With Past History of the following :    Past Medical History:  Diagnosis Date  . Coronary atherosclerosis of native coronary artery    a. VF arrest in [place] s/p BMS LAD 5/11, PTCA circumflex 6/11, LVEF 40%. b. [place] 10/2014 s/p DES to prox and mid RCA, otherwise patent LAD stent and moderate disease in the Cx, LVEF 55-65%.  . Essential hypertension   . Mixed hyperlipidemia   . Myocardial infarction    5/11, complicated by VF arrest  .  Obesity   . Plavix resistance   . Tobacco abuse       Past Surgical History:  Procedure Laterality Date  . APPENDECTOMY    . CARDIAC CATHETERIZATION N/A 11/16/2014   Procedure: Left Heart Cath and Coronary Angiography;  Surgeon: Iran Ouch, MD;  Location: [facility];  Service: Cardiovascular;  Laterality: N/A;  . CARDIAC CATHETERIZATION N/A 11/16/2014   Procedure: Coronary Stent Intervention;  Surgeon: Iran Ouch, MD;  Location: [facility];  Service: Cardiovascular;  Laterality: N/A;  . CERVICAL SPINE SURGERY    . CORONARY ANGIOPLASTY WITH STENT PLACEMENT    . Right breast biopsy        Social History:     Social History  Substance Use Topics  . Smoking status: Current Every Day Smoker    Packs/day: 0.50    Types: Cigarettes    Start date: 08/10/1964  . Smokeless tobacco: Former Neurosurgeon    Quit date: 05/24/2015     Comment: 2 ciggs per day  . Alcohol use No         Family History :     Family History  Problem Relation Age of Onset  . Coronary artery disease Father     MI age 26  . Diverticulitis Mother        Home Medications:   Prior to Admission medications   Medication Sig Start Date End Date Taking? Authorizing Provider  aspirin EC 81 MG tablet Take 81 mg by mouth daily.   Yes Historical Provider, MD  metoprolol succinate (TOPROL XL) 50 MG 24 hr tablet Take 1/2 tablet by mouth daily. Take with or immediately following a meal.  08/24/15  Yes Jonelle Sidle, MD  nitroGLYCERIN (NITROSTAT) 0.4 MG SL tablet Place 1 tablet (0.4 mg total) under the tongue every 5 (five) minutes x 3 doses as needed. 04/11/16  Yes Jonelle Sidle, MD  penicillin v potassium (VEETID) 500 MG tablet Take 1 tablet by mouth 2 (two) times daily. 07/31/16  Yes Historical Provider, MD     Allergies:     Allergies  Allergen Reactions  . Codeine Nausea And Vomiting     Physical Exam:   Vitals  Blood pressure 126/79, pulse 74, temperature 97.7 F (36.5 C), temperature source Oral, resp. rate 15, height  (1.626 m), weight 97.5 kg (215 lb), SpO2 97 %.   1. General middle-aged obese white female lying in bed in NAD,     2. Normal affect and insight, Not Suicidal or Homicidal, Awake Alert, Oriented X 3.  3. No F.N deficits, ALL C.Nerves Intact, Strength 5/5 all 4 extremities, Sensation intact all 4 extremities, Plantars down going.  4. Ears and Eyes appear Normal, Conjunctivae clear, PERRLA. Moist Oral Mucosa.  5. Supple Neck, No JVD, No cervical lymphadenopathy appriciated, No Carotid Bruits.  6. Symmetrical Chest wall movement, Good air movement bilaterally, CTAB.  7. RRR, No Gallops, Rubs or Murmurs, No Parasternal Heave.  8. Positive Bowel Sounds, Abdomen Soft, No tenderness, No organomegaly appriciated,No rebound -guarding or rigidity.  9.  No Cyanosis, Normal Skin Turgor, No Skin Rash or Bruise.  10. Good muscle tone,  joints appear normal , no effusions, Normal ROM.  11. No Palpable Lymph Nodes in Neck or Axillae      Data Review:    CBC  Recent Labs Lab 08/03/16 1307  WBC 12.1*  HGB 15.7*  HCT 45.2  PLT 319  MCV 95.6  MCH 33.2  MCHC  34.7  RDW 13.3  LYMPHSABS 1.9  MONOABS 0.7  EOSABS 0.1  BASOSABS 0.1   ------------------------------------------------------------------------------------------------------------------  Chemistries   Recent Labs Lab 08/03/16 1307  NA 142  K 3.7  CL 107  CO2 24    GLUCOSE 172*  BUN 16  CREATININE 0.92  CALCIUM 9.2   ------------------------------------------------------------------------------------------------------------------ estimated creatinine clearance is 70.9 mL/min (by C-G formula based on SCr of 0.92 mg/dL). ------------------------------------------------------------------------------------------------------------------ No results for input(s): TSH, T4TOTAL, T3FREE, THYROIDAB in the last 72 hours.  Invalid input(s): FREET3  Coagulation profile No results for input(s): INR, PROTIME in the last 168 hours. ------------------------------------------------------------------------------------------------------------------- No results for input(s): DDIMER in the last 72 hours. -------------------------------------------------------------------------------------------------------------------  Cardiac Enzymes No results for input(s): CKMB, TROPONINI, MYOGLOBIN in the last 168 hours.  Invalid input(s): CK ------------------------------------------------------------------------------------------------------------------ No results found for: BNP   ---------------------------------------------------------------------------------------------------------------  Urinalysis No results found for: COLORURINE, APPEARANCEUR, LABSPEC, PHURINE, GLUCOSEU, HGBUR, BILIRUBINUR, KETONESUR, PROTEINUR, UROBILINOGEN, NITRITE, LEUKOCYTESUR  ----------------------------------------------------------------------------------------------------------------   Imaging Results:    Dg Chest Port 1 View  Result Date: 08/03/2016 CLINICAL DATA:  Vomiting being this morning with chest pain and shortness of breath. Previous cardiac stents. EXAM: PORTABLE CHEST 1 VIEW COMPARISON:  11/15/2014 FINDINGS: Lungs are adequately inflated without airspace consolidation or effusion. Cardiomediastinal silhouette is within normal. Remaining bones soft tissues are unremarkable.  IMPRESSION: No acute cardiopulmonary disease. Electronically Signed   By: Elberta Fortis M.D.   On: 08/03/2016 14:00    My personal review of EKG: Rhythm NSR,   no Acute ST changes   Assessment & Plan:      1. Gastroenteritis. Causing nausea vomiting, relatively stable now, give her bowel rest with clear liquid diet, IV fluids, supportive care with IV Zofran and Phenergan suppository, check UA, KUB, liver enzymes and lipase.  2. CAD. No acute issues, pain free at this time, home aspirin and beta blocker will be continued, trend troponin 2.  3. Mid obesity. Follow with PCP for weight loss.   DVT Prophylaxis Heparin    AM Labs Ordered, also please review Full Orders  Family Communication: Admission, patients condition and plan of care including tests being ordered have been discussed with the patient and son who indicate understanding and agree with the plan and Code Status.  Code Status Full  Likely DC to  Home in am  Condition Fair     Consults called: None    Admission status: Obs   Time spent in minutes : 35   Susa Raring M.D on 08/03/2016 at 3:02 PM  Between 7am to 7pm - Pager - [phone] ( page via [facility], text pages only, please mention full 10 digit call back number).  After 7pm go to [email or web address] - password [facility]  [facility] - Office  [phone]

## 2016-08-04 DIAGNOSIS — K529 Noninfective gastroenteritis and colitis, unspecified: Secondary | ICD-10-CM | POA: Diagnosis not present

## 2016-08-04 LAB — COMPREHENSIVE METABOLIC PANEL
ALBUMIN: 3.6 g/dL (ref 3.5–5.0)
ALK PHOS: 61 U/L (ref 38–126)
ALT: 14 U/L (ref 14–54)
AST: 15 U/L (ref 15–41)
Anion gap: 7 (ref 5–15)
BUN: 11 mg/dL (ref 6–20)
CALCIUM: 8.8 mg/dL — AB (ref 8.9–10.3)
CHLORIDE: 104 mmol/L (ref 101–111)
CO2: 28 mmol/L (ref 22–32)
CREATININE: 0.81 mg/dL (ref 0.44–1.00)
GFR calc Af Amer: >60 mL/min (ref >60)
GFR calc non Af Amer: >60 mL/min (ref >60)
GLUCOSE: 121 mg/dL — AB (ref 65–99)
Potassium: 4 mmol/L (ref 3.5–5.1)
SODIUM: 139 mmol/L (ref 135–145)
Total Bilirubin: 0.4 mg/dL (ref 0.3–1.2)
Total Protein: 6.7 g/dL (ref 6.5–8.1)

## 2016-08-04 LAB — CBC
HCT: 41.7 % (ref 36.0–46.0)
HEMOGLOBIN: 14.3 g/dL (ref 12.0–15.0)
MCH: 32.9 pg (ref 26.0–34.0)
MCHC: 34.3 g/dL (ref 30.0–36.0)
MCV: 95.9 fL (ref 78.0–100.0)
Platelets: 296 10*3/uL (ref 150–400)
RBC: 4.35 MIL/uL (ref 3.87–5.11)
RDW: 13.6 % (ref 11.5–15.5)
WBC: 8.9 10*3/uL (ref 4.0–10.5)

## 2016-08-04 LAB — HIV ANTIBODY (ROUTINE TESTING W REFLEX): HIV SCREEN 4TH GENERATION: NONREACTIVE

## 2016-08-04 LAB — MAGNESIUM: Magnesium: 2 mg/dL (ref 1.7–2.4)

## 2016-08-04 MED ORDER — DEXTROSE 5 % IV SOLN
1.0000 g | INTRAVENOUS | Status: DC
  Administered 2016-08-04: 1 g via INTRAVENOUS
  Filled 2016-08-04 (×3): qty 10

## 2016-08-04 MED ORDER — KCL IN DEXTROSE-NACL 20-5-0.9 MEQ/L-%-% IV SOLN
INTRAVENOUS | Status: AC
  Administered 2016-08-04 – 2016-08-05 (×2): via INTRAVENOUS

## 2016-08-04 NOTE — Progress Notes (Signed)
Patient's nausea/vomiting improved and requesting diet be advanced to full liquid. Dr. Thedore Mins notified via text page.

## 2016-08-04 NOTE — Progress Notes (Signed)
PROGRESS NOTE                                                                                                                                                                                                             Patient Demographics:    Stacy Douglas, is a 66 y.o. female, DOB - [birth year], WUJ:[id number]  Admit date - 08/03/2016   Admitting Physician Leroy Sea, MD  Outpatient Primary MD for the patient is [name], FNP  LOS - 0  Chief Complaint  Patient presents with  . Chest Pain  . Emesis       Brief Narrative   Stacy Douglas  is a 66 y.o. female,  With H/O CAD, 3 stents last in 2016, HTN, Dyslipidemia, Plavix resistance, morbid obesity, who was admitted for gastroenteritis.   Subjective:    Stacy Douglas today has, No headache, No chest pain, No abdominal pain - ++ Nausea, No new weakness tingling or numbness, No Cough - SOB.     Assessment  & Plan :     1. Gastroenteritis. Causing nausea vomiting, she is improved but still white nauseated, continue with her bowel rest with clear liquid diet, IV fluids, supportive care with IV Zofran and Phenergan, stable abdominal x-ray, liver enzymes and lipase normal, abdominal exam unremarkable, continue with supportive care likely discharge in the morning if nausea better. Sister further adds that multiple family friends have similar gastroenteritis like symptoms for the last few days.  2. CAD. No acute issues, pain free at this time, home aspirin and beta blocker will be continued, -ve troponin 2.  3. Mid obesity. Follow with PCP for weight loss.    Diet : Diet clear liquid Room service appropriate? Yes; Fluid consistency: Thin    Family Communication  : son and sister  Code Status :  Full  Disposition Plan  :  Home in am if better  Consults  :  None  Procedures  :  None  DVT Prophylaxis  :   Heparin    Lab Results  Component Value Date   PLT 296 08/04/2016    Inpatient Medications  Scheduled Meds: . aspirin EC  81 mg Oral Daily  . cefTRIAXone (ROCEPHIN)  IV  1 g Intravenous Q24H  . heparin  5,000 Units Subcutaneous Q8H  . metoprolol succinate  25 mg  Oral Daily  . pantoprazole (PROTONIX) IV  40 mg Intravenous Q12H   Continuous Infusions: . dextrose 5 % and 0.9 % NaCl with KCl 20 mEq/L     PRN Meds:.albuterol, bisacodyl, HYDROcodone-acetaminophen, nitroGLYCERIN, ondansetron (ZOFRAN) IV, promethazine  Antibiotics  :    Anti-infectives    Start     Dose/Rate Route Frequency Ordered Stop   08/04/16 0630  cefTRIAXone (ROCEPHIN) 1 g in dextrose 5 % 50 mL IVPB     1 g 100 mL/hr over 30 Minutes Intravenous Every 24 hours 08/04/16 0617 08/07/16 0629         Objective:   Vitals:   08/03/16 1615 08/03/16 1707 08/03/16 2209 08/04/16 0608  BP:  132/72 (!) 143/70 135/64  Pulse: 73 81 69 79  Resp: Temp:  97.6 F (36.4 C) 98.4 F (36.9 C) 97.5 F (36.4 C)  TempSrc:  Oral Oral Oral  SpO2: 96% 100% 93% 97%  Weight:  94.8 kg (209 lb 1.6 oz)  94.5 kg (208 lb 5.4 oz)  Height:        Wt Readings from Last 3 Encounters:  08/04/16 94.5 kg (208 lb 5.4 oz)  04/11/16 97.1 kg (214 lb)  08/24/15 94.8 kg (209 lb)     Intake/Output Summary (Last 24 hours) at 08/04/16 0927 Last data filed at 08/04/16 0300  Gross per 24 hour  Intake          1501.25 ml  Output              100 ml  Net          1401.25 ml     Physical Exam  Awake Alert, Oriented X 3, No new F.N deficits, Normal affect [place].AT,PERRAL Supple Neck,No JVD, No cervical lymphadenopathy appriciated.  Symmetrical Chest wall movement, Good air movement bilaterally, CTAB RRR,No Gallops,Rubs or new Murmurs, No Parasternal Heave +ve B.Sounds, Abd Soft, No tenderness, No organomegaly appriciated, No rebound - guarding or rigidity. No Cyanosis, Clubbing or edema, No new Rash or bruise       Data Review:    CBC  Recent Labs Lab 08/03/16 1307  08/04/16 0605  WBC 12.1* 8.9  HGB 15.7* 14.3  HCT 45.2 41.7  PLT 319 296  MCV 95.6 95.9  MCH 33.2 32.9  MCHC 34.7 34.3  RDW 13.3 13.6  LYMPHSABS 1.9  --   MONOABS 0.7  --   EOSABS 0.1  --   BASOSABS 0.1  --     Chemistries   Recent Labs Lab 08/03/16 1307 08/04/16 0605  NA 142 139  K 3.7 4.0  CL 107 104  CO2 24 28  GLUCOSE 172* 121*  BUN 16 11  CREATININE 0.92 0.81  CALCIUM 9.2 8.8*  MG  --  2.0  AST 21 15  ALT 17 14  ALKPHOS 73 61  BILITOT 0.6 0.4   ------------------------------------------------------------------------------------------------------------------ No results for input(s): CHOL, HDL, LDLCALC, TRIG, CHOLHDL, LDLDIRECT in the last 72 hours.  Lab Results  Component Value Date   HGBA1C 6.0 (H) 11/15/2014   ------------------------------------------------------------------------------------------------------------------ No results for input(s): TSH, T4TOTAL, T3FREE, THYROIDAB in the last 72 hours.  Invalid input(s): FREET3 ------------------------------------------------------------------------------------------------------------------ No results for input(s): VITAMINB12, FOLATE, FERRITIN, TIBC, IRON, RETICCTPCT in the last 72 hours.  Coagulation profile No results for input(s): INR, PROTIME in the last 168 hours.  No results for input(s): DDIMER in the last 72 hours.  Cardiac Enzymes  Recent Labs Lab 08/03/16 1517  TROPONINI <0.03   ------------------------------------------------------------------------------------------------------------------  No results found for: BNP  Micro Results No results found for this or any previous visit (from the past 240 hour(s)).  Radiology Reports Dg Chest Port 1 View  Result Date: 08/03/2016 CLINICAL DATA:  Vomiting being this morning with chest pain and shortness of breath. Previous cardiac stents. EXAM: PORTABLE CHEST 1 VIEW COMPARISON:  11/15/2014 FINDINGS: Lungs are adequately inflated without  airspace consolidation or effusion. Cardiomediastinal silhouette is within normal. Remaining bones soft tissues are unremarkable. IMPRESSION: No acute cardiopulmonary disease. Electronically Signed   By: Elberta Fortis M.D.   On: 08/03/2016 14:00   Dg Abd Portable 1v  Result Date: 08/03/2016 CLINICAL DATA:  66 year old female with a history of vomiting and chest pain EXAM: PORTABLE ABDOMEN - 1 VIEW COMPARISON:  None. FINDINGS: Limited plain film with exclusion of portions of the abdomen. Gas within stomach. No significant gas within small bowel. No abnormal distention of small bowel or colon. Mild stool burden. Degenerative changes of the spine. No displaced fracture. No radiopaque foreign body.  No unexpected calcifications. IMPRESSION: Unremarkable bowel gas pattern. No significant stool burden. Electronically Signed   By: Gilmer Mor D.O.   On: 08/03/2016 15:29    Time Spent in minutes  30   Susa Raring M.D on 08/04/2016 at 9:27 AM  Between 7am to 7pm - Pager - [phone] ( page via [facility], text pages only, please mention full 10 digit call back number).  After 7pm go to [email or web address] - password [email or web address]  [facility] -  Office  [phone]

## 2016-08-04 NOTE — Progress Notes (Signed)
Dr. Thedore Mins gave order to advance to a soft diet.

## 2016-08-05 DIAGNOSIS — K529 Noninfective gastroenteritis and colitis, unspecified: Secondary | ICD-10-CM | POA: Diagnosis not present

## 2016-08-05 LAB — COMPREHENSIVE METABOLIC PANEL
ALT: 13 U/L — AB (ref 14–54)
AST: 16 U/L (ref 15–41)
Albumin: 3.3 g/dL — ABNORMAL LOW (ref 3.5–5.0)
Alkaline Phosphatase: 55 U/L (ref 38–126)
Anion gap: 6 (ref 5–15)
BUN: 10 mg/dL (ref 6–20)
CHLORIDE: 109 mmol/L (ref 101–111)
CO2: 27 mmol/L (ref 22–32)
CREATININE: 0.86 mg/dL (ref 0.44–1.00)
Calcium: 8.4 mg/dL — ABNORMAL LOW (ref 8.9–10.3)
Glucose, Bld: 101 mg/dL — ABNORMAL HIGH (ref 65–99)
POTASSIUM: 4.5 mmol/L (ref 3.5–5.1)
Sodium: 142 mmol/L (ref 135–145)
Total Bilirubin: 0.3 mg/dL (ref 0.3–1.2)
Total Protein: 6.1 g/dL — ABNORMAL LOW (ref 6.5–8.1)

## 2016-08-05 LAB — CBC
HCT: 41.1 % (ref 36.0–46.0)
Hemoglobin: 13.8 g/dL (ref 12.0–15.0)
MCH: 33 pg (ref 26.0–34.0)
MCHC: 33.6 g/dL (ref 30.0–36.0)
MCV: 98.3 fL (ref 78.0–100.0)
PLATELETS: 286 10*3/uL (ref 150–400)
RBC: 4.18 MIL/uL (ref 3.87–5.11)
RDW: 14 % (ref 11.5–15.5)
WBC: 8.8 10*3/uL (ref 4.0–10.5)

## 2016-08-05 NOTE — Discharge Summary (Signed)
Stacy Douglas:[id number] DOB: July 28, 1950 DOA: 08/03/2016  PCP: Bennie Pierini, FNP  Admit date: 08/03/2016  Discharge date: 08/05/2016  Admitted From: Home   Disposition:  Home   Recommendations for Outpatient Follow-up:   Follow up with PCP in 1-2 weeks  PCP Please obtain BMP/CBC, 2 view CXR in 1week,  (see Discharge instructions)   PCP Please follow up on the following pending results: None   Home Health: None   Equipment/Devices: none  Consultations: None Discharge Condition: Stable   CODE STATUS: Full   Diet Recommendation: Soft low fat non diary Heart Healthy     Chief Complaint  Patient presents with  . Chest Pain  . Emesis     Brief history of present illness from the day of admission and additional interim summary     LisaLovettis a 66 y.o.female,With H/O CAD, 3 stents last in 2016, HTN, Dyslipidemia, Plavix resistance, morbid obesity, who was admitted for gastroenteritis.                                                                 Hospital Course    1. Gastroenteritis. Causing nausea vomiting, Resolved after conservative treatment with bowel rest and IV fluids along with as needed and Imitrex, completely symptom-free now tolerating oral diet will be discharged home with PCP follow up in a week.  2.CAD. No acute issues, pain free at this time, continue home aspirin and beta blocker, -ve troponin 2.  3.Mid obesity. Follow with PCP for weight loss.   Discharge diagnosis     Principal Problem:   Gastroenteritis Active Problems:   Coronary atherosclerosis of native coronary artery   Tobacco use disorder   Essential hypertension    Discharge instructions    Discharge Instructions    Discharge instructions    Complete by:  As directed    Follow with Primary MD  [name], FNP in 7 days   Get CBC, CMP, 2 view Chest X ray checked  by Primary MD or SNF MD in 5-7 days ( we routinely change or add medications that can affect your baseline labs and fluid status, therefore we recommend that you get the mentioned basic workup next visit with your PCP, your PCP may decide not to get them or add new tests based on their clinical decision)  Activity: As tolerated with Full fall precautions use walker/cane & assistance as needed  Disposition Home    Diet: Soft low fat non diary Heart Healthy    For Heart failure patients - Check your Weight same time everyday, if you gain over 2 pounds, or you develop in leg swelling, experience more shortness of breath or chest pain, call your Primary MD immediately. Follow Cardiac Low Salt Diet and 1.5 lit/day fluid restriction.  On your next visit with  your primary care physician please Get Medicines reviewed and adjusted.  Please request your Prim.MD to go over all Hospital Tests and Procedure/Radiological results at the follow up, please get all Hospital records sent to your Prim MD by signing hospital release before you go home.  If you experience worsening of your admission symptoms, develop shortness of breath, life threatening emergency, suicidal or homicidal thoughts you must seek medical attention immediately by calling 911 or calling your MD immediately  if symptoms less severe.  You Must read complete instructions/literature along with all the possible adverse reactions/side effects for all the Medicines you take and that have been prescribed to you. Take any new Medicines after you have completely understood and accpet all the possible adverse reactions/side effects.   Do not drive, operate heavy machinery, perform activities at heights, swimming or participation in water activities or provide baby sitting services if your were admitted for syncope or siezures until you have seen by Primary MD or a  Neurologist and advised to do so again.  Do not drive when taking Pain medications.    Do not take more than prescribed Pain, Sleep and Anxiety Medications  Special Instructions: If you have smoked or chewed Tobacco  in the last 2 yrs please stop smoking, stop any regular Alcohol  and or any Recreational drug use.  Wear Seat belts while driving.   Please note  You were cared for by a hospitalist during your hospital stay. If you have any questions about your discharge medications or the care you received while you were in the hospital after you are discharged, you can call the unit and asked to speak with the hospitalist on call if the hospitalist that took care of you is not available. Once you are discharged, your primary care physician will handle any further medical issues. Please note that NO REFILLS for any discharge medications will be authorized once you are discharged, as it is imperative that you return to your primary care physician (or establish a relationship with a primary care physician if you do not have one) for your aftercare needs so that they can reassess your need for medications and monitor your lab values.   Increase activity slowly    Complete by:  As directed       Discharge Medications   Allergies as of 08/05/2016      Reactions   Codeine Nausea And Vomiting      Medication List    TAKE these medications   aspirin EC 81 MG tablet Take 81 mg by mouth daily.   metoprolol succinate 50 MG 24 hr tablet Commonly known as:  TOPROL XL Take 1/2 tablet by mouth daily. Take with or immediately following a meal.   nitroGLYCERIN 0.4 MG SL tablet Commonly known as:  NITROSTAT Place 1 tablet (0.4 mg total) under the tongue every 5 (five) minutes x 3 doses as needed.   penicillin v potassium 500 MG tablet Commonly known as:  VEETID Take 1 tablet by mouth 2 (two) times daily.       Follow-up Information    [name], FNP. Schedule an appointment as  soon as possible for a visit in 1 week(s).   Specialty:  Family Medicine Why:  Also follow with her cardiologist within a week to 10 days Contact information: [address] [place] [address] [phone]           Major procedures and Radiology Reports - PLEASE review detailed and final reports thoroughly  -  Dg Chest Port 1 View  Result Date: 08/03/2016 CLINICAL DATA:  Vomiting being this morning with chest pain and shortness of breath. Previous cardiac stents. EXAM: PORTABLE CHEST 1 VIEW COMPARISON:  11/15/2014 FINDINGS: Lungs are adequately inflated without airspace consolidation or effusion. Cardiomediastinal silhouette is within normal. Remaining bones soft tissues are unremarkable. IMPRESSION: No acute cardiopulmonary disease. Electronically Signed   By: Elberta Fortis M.D.   On: 08/03/2016 14:00   Dg Abd Portable 1v  Result Date: 08/03/2016 CLINICAL DATA:  67 year old female with a history of vomiting and chest pain EXAM: PORTABLE ABDOMEN - 1 VIEW COMPARISON:  None. FINDINGS: Limited plain film with exclusion of portions of the abdomen. Gas within stomach. No significant gas within small bowel. No abnormal distention of small bowel or colon. Mild stool burden. Degenerative changes of the spine. No displaced fracture. No radiopaque foreign body.  No unexpected calcifications. IMPRESSION: Unremarkable bowel gas pattern. No significant stool burden. Electronically Signed   By: Gilmer Mor D.O.   On: 08/03/2016 15:29    Micro Results     No results found for this or any previous visit (from the past 240 hour(s)).  Today   Subjective    Lumi Winslett today has no headache,no chest abdominal pain,no new weakness tingling or numbness, feels much better wants to go home today.     Objective   Blood pressure (!) 100/43, pulse 82, temperature 99.3 F (37.4 C), temperature source Oral, resp. rate 16, height  (1.626 m), weight 94.3 kg (207 lb 14.3 oz), SpO2 94  %.   Intake/Output Summary (Last 24 hours) at 08/05/16 0906 Last data filed at 08/05/16 0600  Gross per 24 hour  Intake             1595 ml  Output              350 ml  Net             1245 ml    Exam Awake Alert, Oriented x 3, No new F.N deficits, Normal affect [place].AT,PERRAL Supple Neck,No JVD, No cervical lymphadenopathy appriciated.  Symmetrical Chest wall movement, Good air movement bilaterally, CTAB RRR,No Gallops,Rubs or new Murmurs, No Parasternal Heave +ve B.Sounds, Abd Soft, Non tender, No organomegaly appriciated, No rebound -guarding or rigidity. No Cyanosis, Clubbing or edema, No new Rash or bruise   Data Review   CBC w Diff: Lab Results  Component Value Date   WBC 8.8 08/05/2016   HGB 13.8 08/05/2016   HCT 41.1 08/05/2016   PLT 286 08/05/2016   LYMPHOPCT 16 08/03/2016   MONOPCT 6 08/03/2016   EOSPCT 1 08/03/2016   BASOPCT 0 08/03/2016    CMP: Lab Results  Component Value Date   NA 142 08/05/2016   NA 141 11/02/2013   K 4.5 08/05/2016   CL 109 08/05/2016   CO2 27 08/05/2016   BUN 10 08/05/2016   BUN 11 11/02/2013   CREATININE 0.86 08/05/2016   PROT 6.1 (L) 08/05/2016   PROT 6.7 11/02/2013   ALBUMIN 3.3 (L) 08/05/2016   ALBUMIN 4.6 11/02/2013   BILITOT 0.3 08/05/2016   ALKPHOS 55 08/05/2016   AST 16 08/05/2016   ALT 13 (L) 08/05/2016  .   Total Time in preparing paper work, data evaluation and todays exam - 35 minutes  Susa Raring M.D on 08/05/2016 at 9:06 AM  [facility]   Office  [phone]

## 2016-08-05 NOTE — Progress Notes (Signed)
Discharge instructions read to patient and family.  Pt verbalized understanding. Discharged to home with family member

## 2016-08-05 NOTE — Discharge Instructions (Signed)
Follow with Primary MD [name], FNP in 7 days   Get CBC, CMP, 2 view Chest X ray checked  by Primary MD or SNF MD in 5-7 days ( we routinely change or add medications that can affect your baseline labs and fluid status, therefore we recommend that you get the mentioned basic workup next visit with your PCP, your PCP may decide not to get them or add new tests based on their clinical decision)  Activity: As tolerated with Full fall precautions use walker/cane & assistance as needed  Disposition Home    Diet: Soft low fat non diary Heart Healthy     For Heart failure patients - Check your Weight same time everyday, if you gain over 2 pounds, or you develop in leg swelling, experience more shortness of breath or chest pain, call your Primary MD immediately. Follow Cardiac Low Salt Diet and 1.5 lit/day fluid restriction.  On your next visit with your primary care physician please Get Medicines reviewed and adjusted.  Please request your Prim.MD to go over all Hospital Tests and Procedure/Radiological results at the follow up, please get all Hospital records sent to your Prim MD by signing hospital release before you go home.  If you experience worsening of your admission symptoms, develop shortness of breath, life threatening emergency, suicidal or homicidal thoughts you must seek medical attention immediately by calling 911 or calling your MD immediately  if symptoms less severe.  You Must read complete instructions/literature along with all the possible adverse reactions/side effects for all the Medicines you take and that have been prescribed to you. Take any new Medicines after you have completely understood and accpet all the possible adverse reactions/side effects.   Do not drive, operate heavy machinery, perform activities at heights, swimming or participation in water activities or provide baby sitting services if your were admitted for syncope or siezures until you have seen by  Primary MD or a Neurologist and advised to do so again.  Do not drive when taking Pain medications.    Do not take more than prescribed Pain, Sleep and Anxiety Medications  Special Instructions: If you have smoked or chewed Tobacco  in the last 2 yrs please stop smoking, stop any regular Alcohol  and or any Recreational drug use.  Wear Seat belts while driving.   Please note  You were cared for by a hospitalist during your hospital stay. If you have any questions about your discharge medications or the care you received while you were in the hospital after you are discharged, you can call the unit and asked to speak with the hospitalist on call if the hospitalist that took care of you is not available. Once you are discharged, your primary care physician will handle any further medical issues. Please note that NO REFILLS for any discharge medications will be authorized once you are discharged, as it is imperative that you return to your primary care physician (or establish a relationship with a primary care physician if you do not have one) for your aftercare needs so that they can reassess your need for medications and monitor your lab values.

## 2016-08-05 NOTE — Progress Notes (Signed)
Ms Stacy Douglas has been hospitalized 4/718 through 08/05/16. She may return to work 08/12/16. Koleen Nimrod RN

## 2016-08-05 NOTE — Progress Notes (Signed)
     Stacy Douglas was admitted to the Hospital on 08/03/2016 and Discharged  08/05/2016 and should be excused from work/school   for 3 days starting from date -  08/03/2016 , may return to work/school without any restrictions.  Call Susa Raring MD, [facility]  [phone] with questions.  Susa Raring M.D on 08/05/2016,at 9:38 AM  [facility]   Office  [phone]

## 2016-08-06 ENCOUNTER — Telehealth: Payer: Self-pay | Admitting: Cardiology

## 2016-08-06 NOTE — Telephone Encounter (Signed)
1. Could another session of rehab be ordered for her 2. Wanting to know about checking her weight 3. Do any medications need to be adjusted  Was in [place] this past weekend dsch yesterday

## 2016-08-06 NOTE — Telephone Encounter (Signed)
Please call patient and further flesh out these questions. Her recent hospitalization looks to have been for gastroenteritis, she was described as being stable from a cardiac perspective.

## 2016-08-07 NOTE — Telephone Encounter (Signed)
Patient verbalized understanding. Patient not interested in maintenance exercise program at this time.

## 2016-08-07 NOTE — Telephone Encounter (Signed)
Since she did not have a recent cardiac event, she probably cannot resume formal cardiac rehabilitation. They do have a maintenance exercise program however that she might be able to enroll in if she would like to.

## 2016-08-07 NOTE — Telephone Encounter (Signed)
Patient was hospitalized Saturday 4/7. Instructions from hospital stated for patient to weigh daily and low sodium diet. Patient wants to go back to cardiac rehab. She thinks this will help. Has had some swelling in feet, but only when she is on her feet all day. Weights have went from 214 on Saturday to 206 today. Has apt with pcp Monday 4/16. Does have order for chest xray

## 2016-08-12 ENCOUNTER — Encounter: Payer: Self-pay | Admitting: Family

## 2016-08-12 ENCOUNTER — Ambulatory Visit (INDEPENDENT_AMBULATORY_CARE_PROVIDER_SITE_OTHER): Payer: BLUE CROSS/BLUE SHIELD | Admitting: Family

## 2016-08-12 ENCOUNTER — Ambulatory Visit (INDEPENDENT_AMBULATORY_CARE_PROVIDER_SITE_OTHER): Payer: BLUE CROSS/BLUE SHIELD

## 2016-08-12 VITALS — BP 139/77 | HR 79 | Temp 98.4°F | Ht 64.0 in | Wt 207.4 lb

## 2016-08-12 DIAGNOSIS — R110 Nausea: Secondary | ICD-10-CM

## 2016-08-12 DIAGNOSIS — K529 Noninfective gastroenteritis and colitis, unspecified: Secondary | ICD-10-CM

## 2016-08-12 DIAGNOSIS — Z09 Encounter for follow-up examination after completed treatment for conditions other than malignant neoplasm: Secondary | ICD-10-CM

## 2016-08-12 NOTE — Progress Notes (Signed)
Subjective:    Patient ID: Stacy Douglas, female    DOB: 15-Dec-1950, 66 y.o.   MRN: [id number]  HPI Pt presents to the office today for hospital follow. Pt went to the ED on 08/05/16 for nausea and vomiting. Pt was diagnosed with gastroenteritis and was given IV hydration and bowel rest. Pt was admitted on 07/31/16 and discharged on 08/05/16.   Pt reports she is doing much better and denies any  vomiting, fevers, or abdomin pain. Pt states she does have intermittent nausea. PT states she has had a hx of a stomach ulcer in the past and would like to be tested today for one.   Review of Systems  All other systems reviewed and are negative.      Objective:   Physical Exam  Constitutional: She is oriented to person, place, and time. She appears well-developed and well-nourished. No distress.  HENT:  Head: Normocephalic and atraumatic.  Right Ear: External ear normal.  Left Ear: External ear normal.  Nose: Nose normal.  Mouth/Throat: Oropharynx is clear and moist.  Eyes: Pupils are equal, round, and reactive to light.  Neck: Normal range of motion. Neck supple. No thyromegaly present.  Cardiovascular: Normal rate, regular rhythm, normal heart sounds and intact distal pulses.   No murmur heard. Pulmonary/Chest: Effort normal and breath sounds normal. No respiratory distress. She has no wheezes.  Abdominal: Soft. Bowel sounds are normal. She exhibits no distension. There is no tenderness.  Musculoskeletal: Normal range of motion. She exhibits no edema or tenderness.  Neurological: She is alert and oriented to person, place, and time. She has normal reflexes. No cranial nerve deficit.  Skin: Skin is warm and dry.  Psychiatric: She has a normal mood and affect. Her behavior is normal. Judgment and thought content normal.  Vitals reviewed.    BP 139/77   Pulse 79   Temp 98.4 F (36.9 C)   Ht 5' 4" (1.626 m)   Wt 207 lb 6.4 oz (94.1 kg)   BMI 35.60 kg/m      Assessment & Plan:    1. Nausea - BMP8+EGFR - CBC with Differential/Platelet - DG Chest 2 View; Future - H Pylori, IGM, IGG, IGA AB  2. Hospital discharge follow-up - BMP8+EGFR - CBC with Differential/Platelet - DG Chest 2 View; Future  3. Gastroenteritis - BMP8+EGFR - CBC with Differential/Platelet - DG Chest 2 View; Future   Continue all meds Labs pending Health Maintenance reviewed Diet and exercise encouraged RTO as needed  Evelina Dun, FNP

## 2016-08-12 NOTE — Patient Instructions (Signed)
Gastritis, Adult  Gastritis is inflammation of the stomach. There are two kinds of gastritis:  · Acute gastritis. This kind develops suddenly.  · Chronic gastritis. This kind lasts for a long time.    Gastritis happens when the lining of the stomach becomes weak or gets damaged. Without treatment, gastritis can lead to stomach bleeding and ulcers.  What are the causes?  This condition may be caused by:  · An infection.  · Drinking too much alcohol.  · Certain medicines.  · Having too much acid in the stomach.  · A disease of the intestines or stomach.  · Stress.    What are the signs or symptoms?  Symptoms of this condition include:  · Pain or a burning in the upper abdomen.  · Nausea.  · Vomiting.  · An uncomfortable feeling of fullness after eating.    In some cases, there are no symptoms.  How is this diagnosed?  This condition may be diagnosed with:  · A description of your symptoms.  · A physical exam.  · Tests. These can include:  ? Blood tests.  ? Stool tests.  ? A test in which a thin, flexible instrument with a light and camera on the end is passed down the esophagus and into the stomach (upper endoscopy).  ? A test in which a sample of tissue is taken for testing (biopsy).    How is this treated?  This condition may be treated with medicines. If the condition is caused by a bacterial infection, you may be given antibiotic medicines. If it is caused by too much acid in the stomach, you may get medicines called H2 blockers, proton pump inhibitors, or antacids. Treatment may also involve stopping the use of certain medicines, such as aspirin, ibuprofen, or other nonsteroidal anti-inflammatory drugs (NSAIDs).  Follow these instructions at home:  · Take over-the-counter and prescription medicines only as told by your health care provider.  · If you were prescribed an antibiotic, take it as told by your health care provider. Do not stop taking the antibiotic even if you start to feel better.  · Drink enough  fluid to keep your urine clear or pale yellow.  · Eat small, frequent meals instead of large meals.  Contact a health care provider if:  · Your symptoms get worse.  · Your symptoms return after treatment.  Get help right away if:  · You vomit blood or material that looks like coffee grounds.  · You have black or dark red stools.  · You are unable to keep fluids down.  · Your abdominal pain gets worse.  · You have a fever.  · You do not feel better after 1 week.  This information is not intended to replace advice given to you by your health care provider. Make sure you discuss any questions you have with your health care provider.  Document Released: 04/09/2001 Document Revised: 12/13/2015 Document Reviewed: 01/07/2015  Elsevier Interactive Patient Education © 2017 [employer]vier Inc[employer]

## 2016-08-14 ENCOUNTER — Other Ambulatory Visit: Payer: Self-pay | Admitting: Family

## 2016-08-14 DIAGNOSIS — E875 Hyperkalemia: Secondary | ICD-10-CM

## 2016-08-15 ENCOUNTER — Other Ambulatory Visit: Payer: BLUE CROSS/BLUE SHIELD

## 2016-08-15 DIAGNOSIS — E875 Hyperkalemia: Secondary | ICD-10-CM

## 2016-08-16 LAB — BMP8+EGFR
BUN/Creatinine Ratio: 15 (ref 12–28)
BUN: 15 mg/dL (ref 8–27)
CALCIUM: 9.1 mg/dL (ref 8.7–10.3)
CHLORIDE: 100 mmol/L (ref 96–106)
CO2: 25 mmol/L (ref 18–29)
Creatinine, Ser: 0.97 mg/dL (ref 0.57–1.00)
GFR calc non Af Amer: 62 mL/min/{1.73_m2} (ref >59)
GFR, EST AFRICAN AMERICAN: 71 mL/min/{1.73_m2} (ref >59)
GLUCOSE: 92 mg/dL (ref 65–99)
POTASSIUM: 4.7 mmol/L (ref 3.5–5.2)
Sodium: 142 mmol/L (ref 134–144)

## 2016-08-20 LAB — BMP8+EGFR
BUN/Creatinine Ratio: 15 (ref 12–28)
BUN: 14 mg/dL (ref 8–27)
CALCIUM: 10.1 mg/dL (ref 8.7–10.3)
CHLORIDE: 107 mmol/L — AB (ref 96–106)
CO2: 20 mmol/L (ref 18–29)
Creatinine, Ser: 0.92 mg/dL (ref 0.57–1.00)
GFR calc non Af Amer: 66 mL/min/{1.73_m2} (ref >59)
GFR, EST AFRICAN AMERICAN: 76 mL/min/{1.73_m2} (ref >59)
Glucose: 98 mg/dL (ref 65–99)
Potassium: 5.6 mmol/L — ABNORMAL HIGH (ref 3.5–5.2)
Sodium: 150 mmol/L — ABNORMAL HIGH (ref 134–144)

## 2016-08-20 LAB — CBC WITH DIFFERENTIAL/PLATELET
BASOS: 1 %
Basophils Absolute: 0.1 10*3/uL (ref 0.0–0.2)
EOS (ABSOLUTE): 0.3 10*3/uL (ref 0.0–0.4)
EOS: 3 %
HEMATOCRIT: 44.5 % (ref 34.0–46.6)
Hemoglobin: 15.6 g/dL (ref 11.1–15.9)
Immature Grans (Abs): 0 10*3/uL (ref 0.0–0.1)
Immature Granulocytes: 0 %
LYMPHS ABS: 3.1 10*3/uL (ref 0.7–3.1)
Lymphs: 33 %
MCH: 32.6 pg (ref 26.6–33.0)
MCHC: 35.1 g/dL (ref 31.5–35.7)
MCV: 93 fL (ref 79–97)
MONOS ABS: 0.9 10*3/uL (ref 0.1–0.9)
Monocytes: 10 %
NEUTROS PCT: 53 %
Neutrophils Absolute: 5.1 10*3/uL (ref 1.4–7.0)
Platelets: 308 10*3/uL (ref 150–379)
RBC: 4.79 x10E6/uL (ref 3.77–5.28)
RDW: 14.1 % (ref 12.3–15.4)
WBC: 9.4 10*3/uL (ref 3.4–10.8)

## 2016-08-20 LAB — H PYLORI, IGM, IGG, IGA AB
H. pylori, IgA Abs: 15.9 units — ABNORMAL HIGH (ref 0.0–8.9)
H. pylori, IgG AbS: 7.62 Index Value — ABNORMAL HIGH (ref 0.00–0.79)

## 2016-08-23 ENCOUNTER — Other Ambulatory Visit: Payer: Self-pay | Admitting: Family

## 2016-08-23 ENCOUNTER — Telehealth: Payer: Self-pay | Admitting: Family

## 2016-08-23 LAB — H PYLORI, IGM, IGG, IGA AB
H. PYLORI, IGM ABS: 15.8 U — AB (ref 0.0–8.9)
H. pylori, IgG AbS: 8.81 Index Value — ABNORMAL HIGH (ref 0.00–0.79)

## 2016-08-23 LAB — SPECIMEN STATUS REPORT

## 2016-08-23 MED ORDER — CLARITHROMYCIN 500 MG PO TABS: 500.0000 mg | ORAL_TABLET | Freq: Two times a day (BID) | ORAL | 0 refills | Status: DC

## 2016-08-23 MED ORDER — AMOXICILLIN 500 MG PO CAPS: 1000.0000 mg | ORAL_CAPSULE | Freq: Two times a day (BID) | ORAL | 0 refills | Status: DC

## 2016-08-23 MED ORDER — OMEPRAZOLE 20 MG PO CPDR: 20.0000 mg | DELAYED_RELEASE_CAPSULE | Freq: Two times a day (BID) | ORAL | 1 refills | Status: DC

## 2016-08-23 NOTE — Telephone Encounter (Signed)
Patient awaiting the results of the H.Pylori- please review.

## 2016-08-23 NOTE — Telephone Encounter (Signed)
Positive H Pylori, anitbiotic Prescriptions sent to pharmacy, see lab note

## 2016-08-25 ENCOUNTER — Other Ambulatory Visit: Payer: Self-pay | Admitting: Cardiology

## 2016-10-08 NOTE — Progress Notes (Signed)
Cardiology Office Note  Date: 10/09/2016   ID: Stacy Douglas, DOB 14-Jan-1951, MRN [id number]  PCP: Bennie PieriniMartin, [name], FNP  Primary Cardiologist: Nona DellSamuel McDowell, MD   Chief Complaint  Patient presents with  . Coronary Artery Disease    History of Present Illness: Stacy Douglas is a 66 y.o. female last seen in December 2017. She presents for a routine follow-up visit. States that she has been doing well recently, no angina symptoms or worsening shortness of breath. She has lost 11 pounds through diet, also cut back to 2 cigarettes a day with plan to quit.  I reviewed her medications. She states that she stopped taking Toprol-XL related to low blood pressure and feeling dizzy. We have decided to stop the medication altogether. Cardiac regimen now includes aspirin and as needed nitroglycerin. She remains reluctant to consider any statin therapy. Her last LDL was 99.  Past Medical History:  Diagnosis Date  . Coronary atherosclerosis of native coronary artery    a. [place]F [place] s/p BMS LAD 5/11, PTCA circumflex 6/11, LVEF 40%. b. [place] 10[place]2014 s/p DES to prox and mid RCA, otherwise patent LAD stent and moderate disease in the Cx, LVEF 55-65%.  . Essential hypertension   . Mixed hyperlipidemia   . Myocardial infarction (HCC)    5/11, complicated by VF arrest  . Obesity   . Plavix resistance   . Tobacco abuse     Past Surgical History:  Procedure Laterality Date  . APPENDECTOMY    . CARDIAC CATHETERIZATION N/A 11/16/2014   Procedure: Left Heart Cath and Coronary Angiography;  Surgeon: Iran OuchMuhammad A Arida, MD;  Location: [facility];  Service: Cardiovascular;  Laterality: N/A;  . CARDIAC CATHETERIZATION N/A 11/16/2014   Procedure: Coronary Stent Intervention;  Surgeon: Iran OuchMuhammad A Arida, MD;  Location: [facility];  Service: Cardiovascular;  Laterality: N/A;  . CERVICAL SPINE SURGERY    . CORONARY ANGIOPLASTY WITH STENT PLACEMENT    . Right breast biopsy       Current Outpatient Prescriptions  Medication Sig Dispense Refill  . amoxicillin (AMOXIL) 500 MG capsule Take 2 capsules (1,000 mg total) by mouth 2 (two) times daily. 28 capsule 0  . aspirin EC 81 MG tablet Take 81 mg by mouth daily.    . clarithromycin (BIAXIN) 500 MG tablet Take 1 tablet (500 mg total) by mouth 2 (two) times daily. 28 tablet 0  . nitroGLYCERIN (NITROSTAT) 0.4 MG SL tablet Place 1 tablet (0.4 mg total) under the tongue every 5 (five) minutes x 3 doses as needed. 25 tablet 3  . penicillin v potassium (VEETID) 500 MG tablet Take 1 tablet by mouth 2 (two) times daily.     No current facility-administered medications for this visit.    Allergies:  Codeine   Social History: The patient  reports that she has been smoking Cigarettes.  She started smoking about 52 years ago. She has been smoking about 0.50 packs per day. She quit smokeless tobacco use about 16 months ago. She reports that she does not drink alcohol or use drugs.   ROS:  Please see the history of present illness. Otherwise, complete review of systems is positive for none.  All other systems are reviewed and negative.   Physical Exam: VS:  BP 128/84   Pulse 70   Ht 5\' 4"  (1.626 m)   Wt 203 lb 3.2 oz (92.2 kg)   SpO2 97%   BMI 34.88 kg/m , BMI Body  mass index is 34.88 kg/m.  Wt Readings from Last 3 Encounters:  10/09/16 203 lb 3.2 oz (92.2 kg)  08/12/16 207 lb 6.4 oz (94.1 kg)  08/05/16 207 lb 14.3 oz (94.3 kg)    General: Patient appears comfortable at rest. HEENT: Conjunctiva and lids normal, oropharynx clear with moist mucosa. Neck: Supple, no elevated JVP or carotid bruits, no thyromegaly. Lungs: Clear to auscultation, nonlabored breathing at rest. Cardiac: Regular rate and rhythm, no S3 or significant systolic murmur, no pericardial rub. Abdomen: Soft, nontender, no hepatomegaly, bowel sounds present, no guarding or rebound. Extremities: No pitting edema, distal pulses 2+. Skin: Warm and  dry. Musculoskeletal: No kyphosis. Neuropsychiatric: Alert and oriented x3, affect grossly appropriate.  ECG: I personally reviewed the tracing from 08/03/2016 which showed sinus rhythm with low voltage and nonspecific T-wave changes.  Recent Labwork: 08/04/2016: Magnesium 2.0 08/05/2016: ALT 13; AST 16 08/12/2016: Hemoglobin 15.6; Platelets 308 08/15/2016: BUN 15; Creatinine, Ser 0.97; Potassium 4.7; Sodium 142     Component Value Date/Time   CHOL 158 07/31/2016 0840   TRIG 100 07/31/2016 0840   TRIG 118 11/02/2013 1428   HDL 39 (L) 07/31/2016 0840   HDL 41 11/02/2013 1428   CHOLHDL 4.1 07/31/2016 0840   VLDL 20 07/31/2016 0840   LDLCALC 99 07/31/2016 0840   LDLCALC 103 (H) 11/02/2013 1428    Other Studies Reviewed Today:  Cardiac catheterization 11/16/2014:  Mid RCA lesion, 60% stenosed. The lesion was not previously treated.  Acute Mrg lesion, 60% stenosed.  Prox LAD lesion, 30% stenosed.  Prox LAD to Mid LAD lesion, 20% stenosed. The lesion was previously treated with a stent (unknown type) greater than two years ago.  Ost 1st Diag lesion, 60% stenosed.  Prox Cx lesion, 40% stenosed.  Mid Cx lesion, 50% stenosed.  The left ventricular systolic function is normal.  Prox RCA lesion, 90% stenosed. There is a 0% residual stenosis post intervention. The lesion was not previously treated.  A drug-eluting stent was placed.  1. Patent LAD stent with moderate disease in the left circumflex. Severe proximal RCA and moderate mid RCA disease likely the culprit for unstable angina. 2. Normal LV systolic function and left ventricular end-diastolic pressure. 3. Successful angioplasty and drug-eluting stent placement to the proximal and mid RCA.  Assessment and Plan:  1. CAD status post multivessel distribution PCI as detailed above, most recently DES to the proximal mid RCA with documentation of patent LAD stent site and moderately diseased circumflex as of 2016. She reports no  angina on medical therapy which is now limited to aspirin as needed nitroglycerin. Continue with observation.  2. History of hyperlipidemia, overall mild. She reports statin intolerance. Last LDL was 99.  3. Tobacco abuse, she is cutting back with plan to quit.  4. Essential hypertension. She is losing weight and her blood pressure today looks reasonably good.  Current medicines were reviewed with the patient today.  Disposition: Follow-up in 6 months.  Signed, Jonelle Sidle, MD, [facility] 10/09/2016 2:50 PM    [facility] at [facility] [address] [place], [place], [place] [address] Phone: [phone]; Fax: [phone]

## 2016-10-09 ENCOUNTER — Ambulatory Visit (INDEPENDENT_AMBULATORY_CARE_PROVIDER_SITE_OTHER): Payer: BLUE CROSS/BLUE SHIELD | Admitting: Cardiology

## 2016-10-09 ENCOUNTER — Encounter: Payer: Self-pay | Admitting: Cardiology

## 2016-10-09 VITALS — BP 128/84 | HR 70 | Ht 64.0 in | Wt 203.2 lb

## 2016-10-09 DIAGNOSIS — I10 Essential (primary) hypertension: Secondary | ICD-10-CM | POA: Diagnosis not present

## 2016-10-09 DIAGNOSIS — E782 Mixed hyperlipidemia: Secondary | ICD-10-CM

## 2016-10-09 DIAGNOSIS — Z789 Other specified health status: Secondary | ICD-10-CM | POA: Diagnosis not present

## 2016-10-09 DIAGNOSIS — Z720 Tobacco use: Secondary | ICD-10-CM | POA: Diagnosis not present

## 2016-10-09 DIAGNOSIS — I2510 Atherosclerotic heart disease of native coronary artery without angina pectoris: Secondary | ICD-10-CM | POA: Diagnosis not present

## 2016-10-09 NOTE — Patient Instructions (Signed)
Your physician wants you to follow-up in: 6 MONTHS WITH DR MCDOWELL You will receive a reminder letter in the mail two months in advance. If you don't receive a letter, please call our office to schedule the follow-up appointment.  Your physician has recommended you make the following change in your medication:   STOP METOPROLOL   Thank you for choosing [facility]!!

## 2017-04-14 ENCOUNTER — Encounter: Payer: Self-pay | Admitting: Cardiology

## 2017-04-14 ENCOUNTER — Ambulatory Visit: Payer: BLUE CROSS/BLUE SHIELD | Admitting: Cardiology

## 2017-04-14 VITALS — BP 118/80 | HR 94 | Ht 64.0 in | Wt 203.0 lb

## 2017-04-14 DIAGNOSIS — E782 Mixed hyperlipidemia: Secondary | ICD-10-CM | POA: Diagnosis not present

## 2017-04-14 DIAGNOSIS — I25119 Atherosclerotic heart disease of native coronary artery with unspecified angina pectoris: Secondary | ICD-10-CM | POA: Diagnosis not present

## 2017-04-14 DIAGNOSIS — Z720 Tobacco use: Secondary | ICD-10-CM

## 2017-04-14 DIAGNOSIS — I10 Essential (primary) hypertension: Secondary | ICD-10-CM

## 2017-04-14 MED ORDER — NITROGLYCERIN 0.4 MG SL SUBL: 0.4000 mg | SUBLINGUAL_TABLET | SUBLINGUAL | 3 refills | Status: DC | PRN

## 2017-04-14 NOTE — Patient Instructions (Signed)

## 2017-04-14 NOTE — Progress Notes (Signed)
Cardiology Office Note  Date: 04/14/2017   ID: Stacy Douglas, DOB 02-13-1951, MRN [id number]  PCP: Bennie PieriniMartin, [name], FNP  Primary Cardiologist: Nona DellSamuel McDowell, MD   Chief Complaint  Patient presents with  . Coronary Artery Disease    History of Present Illness: Stacy Douglas is a 66 y.o. female last seen in June. She presents for a routine follow-up visit. She does not report any angina symptoms or nitroglycerin use. 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  I reviewed her medications. At this point she is essentially on aspirin and has nitroglycerin available. She has statin intolerance and has preferred not to pursue other therapies. Continues to follow lipids with PCP. LDL was 99 earlier this year.  She still trying to quit smoking.  Blood pressure is normal today.  Past Medical History:  Diagnosis Date  . Coronary atherosclerosis of native coronary artery    a. [place]F arrest [place] s/p BMS LAD 5/11, PTCA circumflex 6/11, LVEF 40%. b[place] s/p DES to prox and mid RCA, otherwise patent LAD stent and moderate disease in the Cx, LVEF 55-65%.  . Essential hypertension   . Mixed hyperlipidemia   . Myocardial infarction (HCC)    5/11, complicated by VF arrest  . Obesity   . Plavix resistance   . Tobacco abuse     Past Surgical History:  Procedure Laterality Date  . APPENDECTOMY    . CARDIAC CATHETERIZATION N/A 11/16/2014   Procedure: Left Heart Cath and Coronary Angiography;  Surgeon: Iran OuchMuhammad A Arida, MD;  Location: [facility];  Service: Cardiovascular;  Laterality: N/A;  . CARDIAC CATHETERIZATION N/A 11/16/2014   Procedure: Coronary Stent Intervention;  Surgeon: Iran OuchMuhammad A Arida, MD;  Location: [facility];  Service: Cardiovascular;  Laterality: N/A;  . CERVICAL SPINE SURGERY    . CORONARY ANGIOPLASTY WITH STENT PLACEMENT    . Right breast biopsy      Current  Outpatient Medications  Medication Sig Dispense Refill  . aspirin EC 81 MG tablet Take 81 mg by mouth daily.    . nitroGLYCERIN (NITROSTAT) 0.4 MG SL tablet Place 1 tablet (0.4 mg total) under the tongue every 5 (five) minutes x 3 doses as needed. 25 tablet 3   No current facility-administered medications for this visit.    Allergies:  Codeine   Social History: The patient  reports that she has been smoking cigarettes.  She started smoking about 52 years ago. She has been smoking about 0.50 packs per day. She quit smokeless tobacco use about 22 months ago. She reports that she does not drink alcohol or use drugs.   ROS:  Please see the history of present illness. Otherwise, complete review of systems is positive for none.  All other systems are reviewed and negative.   Physical Exam: VS:  BP 118/80   Pulse 94   Ht 5\' 4"  (1.626 m)   Wt 203 lb (92.1 kg)   SpO2 98%   BMI 34.84 kg/m , BMI Body mass index is 34.84 kg/m.  Wt Readings from Last 3 Encounters:  04/14/17 203 lb (92.1 kg)  10/09/16 203 lb 3.2 oz (92.2 kg)  08/12/16 207 lb 6.4 oz (94.1 kg)    General: Patient appears comfortable at rest. HEENT: Conjunctiva and lids normal, oropharynx clear. Neck: Supple, no elevated JVP or carotid bruits, no thyromegaly. Lungs: Clear to auscultation, nonlabored breathing  at rest. Cardiac: Regular rate and rhythm, no S3 or significant systolic murmur, no pericardial rub. Abdomen: Soft, nontender, bowel sounds present. Extremities: No pitting edema, distal pulses 2+. Skin: Warm and dry. Musculoskeletal: No kyphosis. Neuropsychiatric: Alert and oriented x3, affect grossly appropriate.  ECG: I personally reviewed the tracing from 08/03/2016 which showed sinus rhythm with low voltage and nonspecific T-wave changes.  Recent Labwork: 08/04/2016: Magnesium 2.0 08/05/2016: ALT 13; AST 16 08/12/2016: Hemoglobin 15.6; Platelets 308 08/15/2016: BUN 15; Creatinine, Ser 0.97; Potassium 4.7; Sodium 142      Component Value Date/Time   CHOL 158 07/31/2016 0840   TRIG 100 07/31/2016 0840   TRIG 118 11/02/2013 1428   HDL 39 (L) 07/31/2016 0840   HDL 41 11/02/2013 1428   CHOLHDL 4.1 07/31/2016 0840   VLDL 20 07/31/2016 0840   LDLCALC 99 07/31/2016 0840   LDLCALC 103 (H) 11/02/2013 1428    Other Studies Reviewed Today:  Cardiac catheterization and PCI 11/16/2014:  Mid RCA lesion, 60% stenosed. The lesion was not previously treated.  Acute Mrg lesion, 60% stenosed.  Prox LAD lesion, 30% stenosed.  Prox LAD to Mid LAD lesion, 20% stenosed. The lesion was previously treated with a stent (unknown type) greater than two years ago.  Ost 1st Diag lesion, 60% stenosed.  Prox Cx lesion, 40% stenosed.  Mid Cx lesion, 50% stenosed.  The left ventricular systolic function is normal.  Prox RCA lesion, 90% stenosed. There is a 0% residual stenosis post intervention. The lesion was not previously treated.  A drug-eluting stent was placed.   1. Patent LAD stent with moderate disease in the left circumflex. Severe proximal RCA and moderate mid RCA disease likely the culprit for unstable angina. 2. Normal LV systolic function and left ventricular end-diastolic pressure. 3. Successful angioplasty and drug-eluting stent placement to the proximal and mid RCA.  Assessment and Plan:  1. CAD status post multivessel PCI as outlined above with most recent intervention being DES to the proximal and mid RCA. She continues on aspirin. Provided refill for fresh bottle of nitroglycerin.  2. Hyperlipidemia with statin intolerance, last LDL 99. She prefers to avoid other treatment options.  3. Essential hypertension by history, blood pressure is normal today. She is not on any antihypertensive medications at this time.  4. Tobacco abuse. We have discussed smoking cessation. She continues to try to cut back in quit.  Current medicines were reviewed with the patient today.  Disposition: Follow-up in 6  months.  Signed, Jonelle SidleSamuel G. McDowell, MD, [facility] 04/14/2017 4:10 PM    [facility] [address] [place], [place], [place] [address] Phone: [phone]; Fax: [phone]) [phone]

## 2017-06-03 DIAGNOSIS — H43813 Vitreous degeneration, bilateral: Secondary | ICD-10-CM | POA: Diagnosis not present

## 2017-06-18 ENCOUNTER — Telehealth: Payer: Self-pay | Admitting: Cardiology

## 2017-06-18 NOTE — Telephone Encounter (Signed)
patient called back.  Stated that her PCP told her contact us or go to the ED.

## 2017-06-18 NOTE — Telephone Encounter (Signed)
Patient was given the first available with an extender[facility] ([facility]), and advised again, that if her symptoms get worse, that she needed to go to the ED for an evaluation. Patient verbalized understanding of plan. Visit with Mcdowell for 07/03/17 has been canceled.

## 2017-06-18 NOTE — Telephone Encounter (Signed)
Patient c/o a constant tingling between shoulder blades that is interrupting her sleep that started on Monday. Patient said she had these symptoms 8 years ago when she had her heart attack. Patient said her heart rate has been fluctuating between 83-104 on yesterday. Today ranging between 83-89. Patient c/o numbness across the bridge of her nose, left hand was cold on yesterday. No c/o chest pain, dizziness or sob. Medications reconciled while on the phone with patient. Patient was given the first available appointment with Dr. [name] (07/03/17) and advised to contact her PCP for an evaluation of her symptoms and if they felt her problem was cardiac related, to contact our office for a sooner appointment. Patient advised that if her symptoms get worse between now and the time she is evaluated, to go to the ED for an evaluation. Patient verbalized understanding of plan.

## 2017-06-18 NOTE — Telephone Encounter (Signed)
Patient called stating that for 2 days now she is having a tingling sensation between her shoulder blades. She states that it is waking her up at night.with this pain. No Chest pains or shortness of breath noted. States that yesterday her left hand went cold and stayed cold.  Please call cell # [phone].

## 2017-06-30 NOTE — Progress Notes (Addendum)
Cardiology Office Note    Date:  07/01/2017  ID:  EVELINA LORE, DOB May 04, 1950, MRN [id number] PCP:  Chevis Pretty, FNP  Cardiologist:  Dr. Domenic Polite  Chief Complaint: tingling between shoulder blades  History of Present Illness:  Stacy Douglas is a 67 y.o. female with history of IT analyst at [employer] with a history of CAD (VF arrest in [place] s/p BMS to LAD 08/2009, PTCA of Cx in 09/2009 per chart, DES to prox-mid RCA 2016), HLD, HTN, obesity, tobacco abuse who presents for evaluation of tingling between shoulder blades. I met her in 2016 when admitted for episode of chest pain/posterior shoulder pain similar to prior angina at which time she had PCI as above. Cath showed otherwise nonobstructive disease that was treated medically. Effient was stopped 03/2016 after she had completed over a year of therapy. In 09/2016, metoprolol was stopped - patient states this was because she had been doing well without angina. She has h/o statin intolerance and has declined other therapies previously. Last labs 07/2016 showed K 4.7, Cr 0.97, CBC wnl, LDL 99.   She returns for follow-up to discuss sensation of tingling between her shoulder blades. There is no actual pain, moreso just an unusual feeling/discomfort. It has been constant, unrelenting for 2 weeks, regardless of her activity level. She has continued to lop off tree limbs and walk 8000 steps per day without functional limitation. No chest pain, dyspnea, nausea, vomiting or diaphoresis. In 2016 she had chest pain with posterior shoulder pain but she has not had any chest pain with this episode. The discomfort is in the same location as her prior angina but different in quality. She has not tried any medication for the pain - is afraid of taking SL NTG given that she received this prior to cardiac arrest, and prefers to only take it in a controlled environment. The sensation improves if she lays on her side and positions a pillow behind her neck. No  other focal symptoms. She called her PCP and they told her to go to the ER but she has not done so. She also feels like her pulse is jumping around from the 80s-low 100 range which is unusual for her. No bleeding, fever, infective sx, edema, orthopnea although she does state someone at her office told her last week she looked puffy around her eyes.  Past Medical History:  Diagnosis Date  . Coronary atherosclerosis of native coronary artery    a. VF arrest in [place] s/p BMS LAD 5/11, PTCA circumflex 6/11, LVEF 40%. b. [place] 10/2014 s/p DES to prox and mid RCA, otherwise patent LAD stent and moderate disease in the Cx, LVEF 55-65%.  . Essential hypertension   . Mixed hyperlipidemia   . Myocardial infarction ([place])    9/24, complicated by VF arrest  . Obesity   . Plavix resistance   . Tobacco abuse     Past Surgical History:  Procedure Laterality Date  . APPENDECTOMY    . CARDIAC CATHETERIZATION N/A 11/16/2014   Procedure: Left Heart Cath and Coronary Angiography;  Surgeon: Wellington Hampshire, MD;  Location: [facility];  Service: Cardiovascular;  Laterality: N/A;  . CARDIAC CATHETERIZATION N/A 11/16/2014   Procedure: Coronary Stent Intervention;  Surgeon: Wellington Hampshire, MD;  Location: [facility];  Service: Cardiovascular;  Laterality: N/A;  . CERVICAL SPINE SURGERY    . CORONARY ANGIOPLASTY WITH STENT PLACEMENT    . Right breast biopsy  Current Medications: Current Meds  Medication Sig  . aspirin EC 81 MG tablet Take 81 mg by mouth daily.  . nitroGLYCERIN (NITROSTAT) 0.4 MG SL tablet Place 1 tablet (0.4 mg total) under the tongue every 5 (five) minutes x 3 doses as needed.   Allergies:   Codeine   Social History   Socioeconomic History  . Marital status: Divorced    Spouse name: None  . Number of children: None  . Years of education: None  . Highest education level: None  Social Needs  . Financial resource strain: None  . Food insecurity - worry: None  .  Food insecurity - inability: None  . Transportation needs - medical: None  . Transportation needs - non-medical: None  Occupational History  . Occupation: [employer]  Tobacco Use  . Smoking status: Current Some Day Smoker    Packs/day: 0.50    Types: Cigarettes    Start date: 08/10/1964    Last attempt to quit: 08/05/2016    Years since quitting: 0.9  . Smokeless tobacco: Former Systems developer    Quit date: 05/24/2015  . Tobacco comment: 5  ciggs per day  Substance and Sexual Activity  . Alcohol use: No    Alcohol/week: 0.0 oz  . Drug use: No  . Sexual activity: None  Other Topics Concern  . None  Social History Narrative  . None     Family History:  Family History  Problem Relation Age of Onset  . Coronary artery disease Father        MI age 34  . Diverticulitis Mother     ROS:   Please see the history of present illness.  All other systems are reviewed and otherwise negative.    PHYSICAL EXAM:   VS:  BP 124/80 (BP Location: Left Arm)   Pulse 88   Ht _0  (1.626 m)   Wt 197 lb (89.4 kg)   SpO2 98%   BMI 33.81 kg/m   BMI: Body mass index is 33.81 kg/m. GEN: Well nourished, well developed WF, in no acute distress, smiling, easily mobile HEENT: normocephalic, atraumatic Neck: no JVD, carotid bruits, or masses Cardiac: RRR; no murmurs, rubs, or gallops, no edema  Respiratory:  clear to auscultation bilaterally, normal work of breathing GI: soft, nontender, nondistended, + BS MS: no deformity or atrophy  Skin: warm and dry, no rash Neuro:  Alert and Oriented x 3, Strength and sensation are intact, follows commands Psych: euthymic mood, full affect  Wt Readings from Last 3 Encounters:  07/01/17 197 lb (89.4 kg)  04/14/17 203 lb (92.1 kg)  10/09/16 203 lb 3.2 oz (92.2 kg)      Studies/Labs Reviewed:   EKG:  EKG was ordered today and personally reviewed by me and demonstrates NSR 88bpm, no acute ST-T changes compared to prior in 07/2016  Recent Labs: 08/04/2016:  Magnesium 2.0 08/05/2016: ALT 13 08/12/2016: Hemoglobin 15.6; Platelets 308 08/15/2016: BUN 15; Creatinine, Ser 0.97; Potassium 4.7; Sodium 142   Lipid Panel    Component Value Date/Time   CHOL 158 07/31/2016 0840   TRIG 100 07/31/2016 0840   TRIG 118 11/02/2013 1428   HDL 39 (L) 07/31/2016 0840   HDL 41 11/02/2013 1428   CHOLHDL 4.1 07/31/2016 0840   VLDL 20 07/31/2016 0840   LDLCALC 99 07/31/2016 0840   LDLCALC 103 (H) 11/02/2013 1428    Additional studies/ records that were reviewed today include: Cath 10/2014 Conclusion    Mid RCA lesion, 60% stenosed. The  lesion was not previously treated.  Acute Mrg lesion, 60% stenosed.  Prox LAD lesion, 30% stenosed.  Prox LAD to Mid LAD lesion, 20% stenosed. The lesion was previously treated with a stent (unknown type) greater than two years ago.  Ost 1st Diag lesion, 60% stenosed.  Prox Cx lesion, 40% stenosed.  Mid Cx lesion, 50% stenosed.  The left ventricular systolic function is normal.  Prox RCA lesion, 90% stenosed. There is a 0% residual stenosis post intervention. The lesion was not previously treated.  A drug-eluting stent was placed.   1. Patent LAD stent with moderate disease in the left circumflex. Severe proximal RCA and moderate mid RCA disease likely the culprit for unstable angina. 2. Normal LV systolic function and left ventricular end-diastolic pressure. 3. Successful angioplasty and drug-eluting stent placement to the proximal and mid RCA.  Recommendations: Dual antiplatelets therapy for at least one year. Aggressive treatment of risk factors and smoking cessation.      ASSESSMENT & PLAN:   1. Discomfort of back with tingling between shoulder blades - somewhat atypical, although patient has had atypical discomfort in the past resulting in PCI. The location of discomfort (tingling) is in a similar distribution as prior angina although she's not had any actual pain or chest pain like prior episodes.  Furthermore, this discomfort is completely unaffected by exertion, and improved with laying on her side with a pillow positioned in a certain way. EKG is unchanged from prior. Reviewed clinical case with Dr. Bronson Ing (DOD in office). Given 2 weeks of constant unrelenting discomfort, would expect troponin to be abnormal if this was cardiac in origin. Will check stat troponin - if positive, she will need to go to the ER for further cycling/management and probable transfer to [facility] for cath. If negative, will plan on adding back her metoprolol (12.69m daily) and schedule for exercise nuclear stress test, holding BB the day of the test. Continue ASA. ER precautions reviewed as well -> if she has any development of actual chest pain, she knows to proceed to ER. Addendum: troponin negative but labs suggest mild dehydration. She drinks 2.5 cups of coffee and then hot tea per day. Advised to cut back to 1 cup coffee and sub out water for the rest - this may be driving the elevated HR and can be rechecked at time of stress test. 2. Elevated pulse rate - she reports fluctuation of her HR between 80-105 that she is able to feel. She has become anxious about this above so this may be driving the picture, but will check labwork to make sure there is nothing metabolic going on to cause  3. CAD - as above. Continue ASA. Add BB. See below re: cholesterol. 4. Hyperlipidemia - she reports she was uninterested in alternative therapies because she thought they were related to statins, and also thought her cholesterol was under control. We discussed LDL goal of <70 and the role of PCSK9 inhibitors. She expresses interest and plans to discuss with her insurance company/HR their coverage options. I told her to please call the office if she decides to go forward so that we can schedule her in the lipid clinic with pharmacist to discuss options. Addendum: she wishes to go ahead and schedule this; HJarrett Sohowith front desk sent message to  [place]. 5. HTN - follow BP with re-addition of metoprolol.  Disposition: F/u with Dr. McDowell/APP next available appt.   Medication Adjustments/Labs and Tests Ordered: Current medicines are reviewed at  length with the patient today.  Concerns regarding medicines are outlined above. Medication changes, Labs and Tests ordered today are summarized above and listed in the Patient Instructions accessible in Encounters.   Signed, Charlie Pitter, PA-C  07/01/2017 3:42 PM    [facility] Location in [place]. [place], [place] [address] Ph: [phone]; Fax [phone]

## 2017-07-01 ENCOUNTER — Other Ambulatory Visit (HOSPITAL_COMMUNITY)
Admission: RE | Admit: 2017-07-01 | Discharge: 2017-07-01 | Disposition: A | Discharge status: Home / Self Care | Payer: BLUE CROSS/BLUE SHIELD | Source: Ambulatory Visit | Attending: Physician Assistant | Admitting: Physician Assistant

## 2017-07-01 ENCOUNTER — Encounter: Payer: Self-pay | Admitting: *Deleted

## 2017-07-01 ENCOUNTER — Ambulatory Visit: Payer: BLUE CROSS/BLUE SHIELD | Admitting: Physician Assistant

## 2017-07-01 ENCOUNTER — Encounter: Payer: Self-pay | Admitting: Physician Assistant

## 2017-07-01 VITALS — BP 124/80 | HR 88 | Ht 64.0 in | Wt 197.0 lb

## 2017-07-01 DIAGNOSIS — M549 Dorsalgia, unspecified: Secondary | ICD-10-CM | POA: Diagnosis not present

## 2017-07-01 DIAGNOSIS — I10 Essential (primary) hypertension: Secondary | ICD-10-CM | POA: Insufficient documentation

## 2017-07-01 DIAGNOSIS — E785 Hyperlipidemia, unspecified: Secondary | ICD-10-CM

## 2017-07-01 DIAGNOSIS — I2510 Atherosclerotic heart disease of native coronary artery without angina pectoris: Secondary | ICD-10-CM | POA: Insufficient documentation

## 2017-07-01 DIAGNOSIS — R000 Tachycardia, unspecified: Secondary | ICD-10-CM

## 2017-07-01 LAB — BASIC METABOLIC PANEL
Anion gap: 10 (ref 5–15)
BUN: 24 mg/dL — AB (ref 6–20)
CHLORIDE: 104 mmol/L (ref 101–111)
CO2: 25 mmol/L (ref 22–32)
Calcium: 9 mg/dL (ref 8.9–10.3)
Creatinine, Ser: 1.1 mg/dL — ABNORMAL HIGH (ref 0.44–1.00)
GFR calc Af Amer: >60 mL/min (ref >60)
GFR calc non Af Amer: 52 mL/min — ABNORMAL LOW (ref >60)
GLUCOSE: 104 mg/dL — AB (ref 65–99)
Potassium: 4.5 mmol/L (ref 3.5–5.1)
SODIUM: 139 mmol/L (ref 135–145)

## 2017-07-01 LAB — CBC
HCT: 45.5 % (ref 36.0–46.0)
Hemoglobin: 15.1 g/dL — ABNORMAL HIGH (ref 12.0–15.0)
MCH: 32.3 pg (ref 26.0–34.0)
MCHC: 33.2 g/dL (ref 30.0–36.0)
MCV: 97.2 fL (ref 78.0–100.0)
Platelets: 294 10*3/uL (ref 150–400)
RBC: 4.68 MIL/uL (ref 3.87–5.11)
RDW: 13.6 % (ref 11.5–15.5)
WBC: 9.3 10*3/uL (ref 4.0–10.5)

## 2017-07-01 LAB — TSH: TSH: 2.159 u[IU]/mL (ref 0.350–4.500)

## 2017-07-01 LAB — TROPONIN I: Troponin I: <0.03 ng/mL (ref <0.03)

## 2017-07-01 MED ORDER — METOPROLOL SUCCINATE ER 25 MG PO TB24: 12.5000 mg | ORAL_TABLET | Freq: Every day | ORAL | 3 refills | Status: DC

## 2017-07-01 NOTE — Addendum Note (Signed)
Addended by: Kerney ElbePINNIX, Jahmad Petrich G on: 07/01/2017 05:09 PM   Modules accepted: Orders

## 2017-07-01 NOTE — Patient Instructions (Addendum)
Medication Instructions:  Your physician has recommended you make the following change in your medication: Start Toprol XL 12.5 mg Daily    Labwork: Your physician recommends that you return for lab work in: Today    Testing/Procedures: Your physician has requested that you have en exercise stress myoview. For further information please visit [email or web address]. Please follow instruction sheet, as given.    Follow-Up: Your physician recommends that you schedule a follow-up appointment in: 1 Week    Any Other Special Instructions Will Be Listed Below (If Applicable).  Please Ask Your HR Department about coverage of PCSK9 Inhibitors     If you need a refill on your cardiac medications before your next appointment, please call your pharmacy. Thank you for choosing [facility]!

## 2017-07-02 ENCOUNTER — Telehealth: Payer: Self-pay | Admitting: *Deleted

## 2017-07-02 DIAGNOSIS — Z79899 Other long term (current) drug therapy: Secondary | ICD-10-CM

## 2017-07-02 NOTE — Telephone Encounter (Signed)
Patient notified

## 2017-07-02 NOTE — Telephone Encounter (Signed)
-----   Message from Laurann Montanaayna N Dunn, [place] sent at 07/02/2017  8:03 AM EST ----- Please let patient know thyroid was normal as well. Please arrange repeat BMET when she returns for her echo.Besides staying hydrated she should also avoid NSAIDS] like ibuprofen, Advil, Motrin, naproxen, and Aleve due to risk of worsening kidney function. Dayna Dunn PA-C

## 2017-07-02 NOTE — Telephone Encounter (Signed)
Called patient with test results. No answer. Left message to call back.  

## 2017-07-02 NOTE — Telephone Encounter (Signed)
-----   Message from Laurann Montanaayna N Dunn, PA-C sent at 07/02/2017  8:12 AM EST ----- Typo: instead of echo I meant stress test.

## 2017-07-03 ENCOUNTER — Ambulatory Visit: Payer: BLUE CROSS/BLUE SHIELD | Admitting: Cardiology

## 2017-07-04 ENCOUNTER — Encounter (HOSPITAL_COMMUNITY): Payer: Self-pay

## 2017-07-04 ENCOUNTER — Encounter (HOSPITAL_BASED_OUTPATIENT_CLINIC_OR_DEPARTMENT_OTHER)
Admission: RE | Admit: 2017-07-04 | Discharge: 2017-07-04 | Disposition: A | Discharge status: Home / Self Care | Payer: BLUE CROSS/BLUE SHIELD | Source: Ambulatory Visit | Attending: Physician Assistant | Admitting: Physician Assistant

## 2017-07-04 ENCOUNTER — Encounter (HOSPITAL_COMMUNITY)
Admission: RE | Admit: 2017-07-04 | Discharge: 2017-07-04 | Disposition: A | Discharge status: Home / Self Care | Payer: BLUE CROSS/BLUE SHIELD | Source: Ambulatory Visit | Attending: Physician Assistant | Admitting: Physician Assistant

## 2017-07-04 ENCOUNTER — Other Ambulatory Visit (HOSPITAL_COMMUNITY)
Admission: RE | Admit: 2017-07-04 | Discharge: 2017-07-04 | Disposition: A | Discharge status: Home / Self Care | Payer: BLUE CROSS/BLUE SHIELD | Source: Ambulatory Visit | Attending: Physician Assistant | Admitting: Physician Assistant

## 2017-07-04 DIAGNOSIS — Z79899 Other long term (current) drug therapy: Secondary | ICD-10-CM | POA: Diagnosis not present

## 2017-07-04 DIAGNOSIS — M549 Dorsalgia, unspecified: Secondary | ICD-10-CM

## 2017-07-04 DIAGNOSIS — I2510 Atherosclerotic heart disease of native coronary artery without angina pectoris: Secondary | ICD-10-CM

## 2017-07-04 LAB — NM MYOCAR MULTI W/SPECT W/WALL MOTION / EF
CHL CUP MPHR: 156 {beats}/min
CHL CUP NUCLEAR SDS: 3
CHL CUP NUCLEAR SRS: 0
CSEPED: 7 min
CSEPEW: 8.1 METS
CSEPHR: 85 %
CSEPPHR: 133 {beats}/min
Exercise duration (sec): 16 s
LHR: 0.5
LV dias vol: 33 mL (ref 46–106)
LV sys vol: 9 mL
NUC STRESS TID: 1.21
RPE: 17
Rest HR: 74 {beats}/min
SSS: 3

## 2017-07-04 LAB — BASIC METABOLIC PANEL
Anion gap: 10 (ref 5–15)
BUN: 17 mg/dL (ref 6–20)
CO2: 25 mmol/L (ref 22–32)
CREATININE: 0.87 mg/dL (ref 0.44–1.00)
Calcium: 9.1 mg/dL (ref 8.9–10.3)
Chloride: 105 mmol/L (ref 101–111)
GFR calc Af Amer: >60 mL/min (ref >60)
GFR calc non Af Amer: >60 mL/min (ref >60)
GLUCOSE: 101 mg/dL — AB (ref 65–99)
POTASSIUM: 4 mmol/L (ref 3.5–5.1)
SODIUM: 140 mmol/L (ref 135–145)

## 2017-07-04 MED ORDER — TECHNETIUM TC 99M TETROFOSMIN IV KIT
30.0000 | PACK | Freq: Once | INTRAVENOUS | Status: AC | PRN
  Administered 2017-07-04: 33 via INTRAVENOUS

## 2017-07-04 MED ORDER — SODIUM CHLORIDE 0.9% FLUSH
INTRAVENOUS | Status: AC
  Administered 2017-07-04: 10 mL via INTRAVENOUS
  Filled 2017-07-04: qty 10

## 2017-07-04 MED ORDER — REGADENOSON 0.4 MG/5ML IV SOLN
INTRAVENOUS | Status: AC
  Filled 2017-07-04: qty 5

## 2017-07-04 MED ORDER — TECHNETIUM TC 99M TETROFOSMIN IV KIT
10.0000 | PACK | Freq: Once | INTRAVENOUS | Status: AC | PRN
  Administered 2017-07-04: 10.4 via INTRAVENOUS

## 2017-07-04 MED ORDER — SODIUM CHLORIDE 0.9% FLUSH
INTRAVENOUS | Status: AC
  Filled 2017-07-04: qty 180

## 2017-07-11 ENCOUNTER — Ambulatory Visit: Payer: BLUE CROSS/BLUE SHIELD

## 2017-07-14 NOTE — Progress Notes (Deleted)
Patient ID: Stacy RalphsLisa S Loewen                 DOB: November 14, 1950                    MRN: [id number]     HPI: Stacy Douglas is a 67 y.o. female patient of Dr. [name], referred by Ronie Spiesayna Dunn, PA that presents today for lipid evaluation.  PMH includes CAD (VF arrest [place] s/p BMS to LAD 08/2009, PTCA of Cx in 09/2009 per chart, DES to prox-mid RCA 2016), HLD, HTN, obesity, tobacco abuse. She previously has taken rosuvastatin at varying doses daily and atorvastatin 40mg  daily.   Patient presents today for discussion of PCSK9i therapy   Risk Factors: CAD s/p stenting LDL Goal: <70  Current Medications:  Intolerances: Crestor 20mg  daily, 10mg  daily, 5mg  daily, atorvastatin 40mg  daily (muscle pain in legs)  Diet:   Exercise:   Family History: Father - CAD with MI at age 65, mother - diverticulitis  Social History: current smoker, denies alcohol.   Labs: 07/31/16: TC 158, TG 100, HDL 39, LDL 99 (no therapy)  Past Medical History:  Diagnosis Date  . Coronary atherosclerosis of native coronary artery    a. [place] s/p BMS LAD 5/11, PTCA circumflex 6/11, LVEF 40%. [place] 10[place] s/p DES to prox and mid RCA, otherwise patent LAD stent and moderate disease in the Cx, LVEF 55-65%.  . Essential hypertension   . Mixed hyperlipidemia   . Myocardial infarction ([facility])    5/11, complicated by VF arrest  . Obesity   . Plavix resistance   . Tobacco abuse     Current Outpatient Medications on File Prior to Visit  Medication Sig Dispense Refill  . aspirin EC 81 MG tablet Take 81 mg by mouth daily.    . metoprolol succinate (TOPROL XL) 25 MG 24 hr tablet Take 0.5 tablets (12.5 mg total) by mouth daily. 45 tablet 3  . nitroGLYCERIN (NITROSTAT) 0.4 MG SL tablet Place 1 tablet (0.4 mg total) under the tongue every 5 (five) minutes x 3 doses as needed. 25 tablet 3   No current facility-administered medications on file prior to visit.     Allergies  Allergen Reactions  . Codeine Nausea  And Vomiting    Assessment/Plan: Hyperlipidemia: LDL not at goal.    Thank you,  Freddie ApleyKelley M. Cleatis PolkaAuten, PharmD  [facility]  07/14/2017 9:29 PM

## 2017-07-15 ENCOUNTER — Ambulatory Visit: Payer: BLUE CROSS/BLUE SHIELD

## 2017-07-23 ENCOUNTER — Encounter: Payer: Self-pay | Admitting: Student

## 2017-07-23 ENCOUNTER — Ambulatory Visit: Payer: BLUE CROSS/BLUE SHIELD | Admitting: Student

## 2017-07-23 VITALS — BP 132/74 | HR 86 | Ht 64.0 in | Wt 199.0 lb

## 2017-07-23 DIAGNOSIS — I10 Essential (primary) hypertension: Secondary | ICD-10-CM | POA: Diagnosis not present

## 2017-07-23 DIAGNOSIS — I2510 Atherosclerotic heart disease of native coronary artery without angina pectoris: Secondary | ICD-10-CM

## 2017-07-23 DIAGNOSIS — E785 Hyperlipidemia, unspecified: Secondary | ICD-10-CM

## 2017-07-23 DIAGNOSIS — Z789 Other specified health status: Secondary | ICD-10-CM

## 2017-07-23 NOTE — Progress Notes (Signed)
Cardiology Office Note    Date:  07/23/2017   ID:  Stacy Douglas, DOB 1950/07/29, MRN [id number]  PCP:  Bennie Pierini, FNP  Cardiologist: Nona Dell, MD    Chief Complaint  Patient presents with  . Follow-up    3 week visit; s/p stress testing    History of Present Illness:    Stacy Douglas is a 67 y.o. female with past medical history of CAD (s/p VF arrest in [place] in 08/2009 with BMS to LAD, PTCA of LCx in 09/2009 and DES to prox and mid-RCA in 2016), HTN, HLD, and tobacco use who presents to the office today for 3-week follow-up.   She was recently evaluated by Ronie Spies, PA-C on 07/01/2017 and reported having a sensation of tingling between her shoulder blades bilaterally that had been constant for the past 2 weeks. Reported walking up to 8000 steps per day without any exertional symptoms. No recent chest pain or dyspnea on exertion. Pain improved with positional changes and putting a pillow behind her neck. Her pain was thought to be somewhat atypical for angina but was in a similar area as compared to when she required stent placement in the past. A STAT troponin was obtained at that time and negative. Kidney function was mildly elevated with creatinine at 1.10 (repeat labs on 3/8 showed kidney function had improved to 0.87). A stress test was recommended for further ischemic evaluation. This was performed on 07/04/2017 and showed no significant EKG changes with no perfusion defects noted on imaging. Was overall a low-risk study.   In talking with the patient today, she reports significant improvement in her symptoms since her last office visit. She has [place] up [place] 2 [place] and denies any chest pain or dyspnea on exertion with this. No recent orthopnea, PND or lower extremity edema.  She was experiencing occasional palpitations at the time of her last office visit but denies any repeat symptoms since being started on  Toprol-XL.   Past Medical History:  Diagnosis Date  . Coronary atherosclerosis of native coronary artery    a. VF arrest in [place] s/p BMS LAD 5/11, PTCA circumflex 6/11, LVEF 40%. b. [place] 10/2014 s/p DES to prox and mid RCA, otherwise patent LAD stent and moderate disease in the Cx, LVEF 55-65%.  . Essential hypertension   . Mixed hyperlipidemia   . Myocardial infarction (HCC)    5/11, complicated by VF arrest  . Obesity   . Plavix resistance   . Tobacco abuse     Past Surgical History:  Procedure Laterality Date  . APPENDECTOMY    . CARDIAC CATHETERIZATION N/A 11/16/2014   Procedure: Left Heart Cath and Coronary Angiography;  Surgeon: Iran Ouch, MD;  [facility]: [facility];  Service: Cardiovascular;  Laterality: N/A;  . CARDIAC CATHETERIZATION N/A 11/16/2014   Procedure: Coronary Stent Intervention;  Surgeon: Iran Ouch, MD;  Location: [facility];  Service: Cardiovascular;  Laterality: N/A;  . CERVICAL SPINE SURGERY    . CORONARY ANGIOPLASTY WITH STENT PLACEMENT    . Right breast biopsy      Current Medications: Outpatient Medications Prior to Visit  Medication Sig Dispense Refill  . aspirin EC 81 MG tablet Take 81 mg by mouth daily.    . metoprolol succinate (TOPROL XL) 25 MG 24 hr tablet Take 0.5 tablets (12.5 mg total) by mouth daily. 45 tablet 3  . nitroGLYCERIN (NITROSTAT) 0.4 MG  SL tablet Place 1 tablet (0.4 mg total) under the tongue every 5 (five) minutes x 3 doses as needed. 25 tablet 3   No facility-administered medications prior to visit.      Allergies:   Crestor [rosuvastatin calcium] and Codeine   Social History   Socioeconomic History  . Marital status: Divorced    Spouse name: Not on file  . Number of children: Not on file  . Years of education: Not on file  . Highest education level: Not on file  Occupational History  . Occupation: [employer]  Social Needs  . Financial resource strain: Not on file  . Food insecurity:     Worry: Not on file    Inability: Not on file  . Transportation needs:    Medical: Not on file    Non-medical: Not on file  Tobacco Use  . Smoking status: Current Some Day Smoker    Packs/day: 0.50    Types: Cigarettes    Start date: 08/10/1964    Last attempt to quit: 08/05/2016    Years since quitting: 0.9  . Smokeless tobacco: Former NeurosurgeonUser    Quit date: 05/24/2015  . Tobacco comment: 5  ciggs per day  Substance and Sexual Activity  . Alcohol use: No    Alcohol/week: 0.0 oz  . Drug use: No  . Sexual activity: Not on file  Lifestyle  . Physical activity:    Days per week: Not on file    Minutes per session: Not on file  . Stress: Not on file  Relationships  . Social connections:    Talks on phone: Not on file    Gets together: Not on file    Attends religious service: Not on file    Active member of club or organization: Not on file    Attends meetings of clubs or organizations: Not on file    Relationship status: Not on file  Other Topics Concern  . Not on file  Social History Narrative  . Not on file     Family History:  The patient's family history includes Coronary artery disease in her father; Diverticulitis in her mother.   Review of Systems:   Please see the history of present illness.     General:  No chills, fever, night sweats or weight changes. Positive for back pain.  Cardiovascular:  No chest pain, dyspnea on exertion, edema, orthopnea, palpitations, paroxysmal nocturnal dyspnea. Dermatological: No rash, lesions/masses Respiratory: No cough, dyspnea Urologic: No hematuria, dysuria Abdominal:   No nausea, vomiting, diarrhea, bright red blood per rectum, melena, or hematemesis Neurologic:  No visual changes, wkns, changes in mental status.  All other systems reviewed and are otherwise negative except as noted above.   Physical Exam:    VS:  BP 132/74   Pulse 86   Ht 5\' 4"  (1.626 m)   Wt 199 lb (90.3 kg)   SpO2 98%   BMI 34.16 kg/m    General: Well  developed, well nourished Caucasian female appearing in no acute distress. Head: Normocephalic, atraumatic, sclera non-icteric, no xanthomas, nares are without discharge.  Neck: No carotid bruits. JVD not elevated.  Lungs: Respirations regular and unlabored, without wheezes or rales.  Heart: Regular rate and rhythm. No S3 or S4.  No murmur, no rubs, or gallops appreciated. Abdomen: Soft, non-tender, non-distended with normoactive bowel sounds. No hepatomegaly. No rebound/guarding. No obvious abdominal masses. Msk:  Strength and tone appear normal for age. No joint deformities or effusions. Extremities: No clubbing  or cyanosis. No lower extremity edema.  Distal pedal pulses are 2+ bilaterally. Neuro: Alert and oriented X 3. Moves all extremities spontaneously. No focal deficits noted. Psych:  Responds to questions appropriately with a normal affect. Skin: No rashes or lesions noted  Wt Readings from Last 3 Encounters:  07/23/17 199 lb (90.3 kg)  07/01/17 197 lb (89.4 kg)  04/14/17 203 lb (92.1 kg)     Studies/Labs Reviewed:   EKG:  EKG is not ordered today.    Recent Labs: 08/04/2016: Magnesium 2.0 08/05/2016: ALT 13 07/01/2017: Hemoglobin 15.1; Platelets 294; TSH 2.159 07/04/2017: BUN 17; Creatinine, Ser 0.87; Potassium 4.0; Sodium 140   Lipid Panel    Component Value Date/Time   CHOL 158 07/31/2016 0840   TRIG 100 07/31/2016 0840   TRIG 118 11/02/2013 1428   HDL 39 (L) 07/31/2016 0840   HDL 41 11/02/2013 1428   CHOLHDL 4.1 07/31/2016 0840   VLDL 20 07/31/2016 0840   LDLCALC 99 07/31/2016 0840   LDLCALC 103 (H) 11/02/2013 1428    Additional studies/ records that were reviewed today include:   Cardiac Catheterization: 10/2014  Mid RCA lesion, 60% stenosed. The lesion was not previously treated.  Acute Mrg lesion, 60% stenosed.  Prox LAD lesion, 30% stenosed.  Prox LAD to Mid LAD lesion, 20% stenosed. The lesion was previously treated with a stent (unknown type) greater than  two years ago.  Ost 1st Diag lesion, 60% stenosed.  Prox Cx lesion, 40% stenosed.  Mid Cx lesion, 50% stenosed.  The left ventricular systolic function is normal.  Prox RCA lesion, 90% stenosed. There is a 0% residual stenosis post intervention. The lesion was not previously treated.  A drug-eluting stent was placed.   1. Patent LAD stent with moderate disease in the left circumflex. Severe proximal RCA and moderate mid RCA disease likely the culprit for unstable angina. 2. Normal LV systolic function and left ventricular end-diastolic pressure. 3. Successful angioplasty and drug-eluting stent placement to the proximal and mid RCA.  Recommendations: Dual antiplatelets therapy for at least one year. Aggressive treatment of risk factors and smoking cessation.  NST: 07/04/2017  Blood pressure demonstrated a normal response to exercise.  There was no ST segment deviation noted during stress.  The study is normal. There are no perfusion defects  This is a low risk study.  The left ventricular ejection fraction is hyperdynamic (>65%).  [facility] treadmill score of 7 supports low risk for major cardiac events.  Assessment:    1. Coronary artery disease involving native coronary artery of native heart without angina pectoris   2. Essential hypertension   3. Hyperlipidemia LDL goal <70   4. Statin intolerance      Plan:   In order of problems listed above:  1. CAD - s/p VF arrest in [place] in 08/2009 with BMS to LAD, PTCA of LCx in 09/2009 and DES to prox and mid-RCA in 2016.  - recently evaluated for tingling between her shoulder blades bilaterally that had been constant for the past 2 weeks and overall atypical for angina but was in a similar area as compared to when she required stent placement in the past. Troponin at the time of her visit was negative and EKG showed no acute ischemic changes. NST on 07/04/2017 showed no perfusion defects and was overall a low-risk study.  -  She denies any repeat symptoms since her last office visit and has been exercising daily without any recurrent anginal symptoms. No indication for further  testing at this time. - continue ASA and BB. Intolerant to statins as outlined below.   2. HTN - BP is well controlled at 132/74 during today's visit. - continue Toprol-XL.   3. HLD/ Statin Intolerance - Most recent FLP by review of Epic from 07/2016 showed total cholesterol 158, triglycerides 100, HDL 39, and LDL 99. Goal LDL < 70 with known CAD. Was recently referred to the [facility] for consideration of PCSK9-inhibitor but had to cancel due to her work schedule.  - In talking with the patient, she has only tried Crestor in the past. She is not open to trying other statins due to the significant myalgias she experienced in the past. She is in agreement to trying Zetia and I informed the patient she would likely have to try this initially prior to being approved for a PCSK9 inhibitor by her insurance. She has recently started exercising regularly and would like to see what effect this has on her cholesterol prior to starting further medications. Therefore, we will plan to repeat a FLP prior to her next office visit in 09/2017. If not at goal, would plan to start Zetia 10 mg daily.     Medication Adjustments/Labs and Tests Ordered: Current medicines are reviewed at length with the patient today.  Concerns regarding medicines are outlined above.  Medication changes, Labs and Tests ordered today are listed in the Patient Instructions below. Patient Instructions  Medication Instructions:  Your physician recommends that you continue on your current medications as directed. Please refer to the Current Medication list given to you today.  Labwork: Your physician recommends that you return for lab work in: June just before next visit.   Testing/Procedures: NONE   Follow-Up: Your physician recommends that you schedule a follow-up appointment  in: June in the [place] office.   Any Other Special Instructions Will Be Listed Below (If Applicable).  If you need a refill on your cardiac medications before your next appointment, please call your pharmacy. Thank you for choosing [facility]!     Signed, Ellsworth Lennox, PA-C  07/23/2017 4:37 PM    [facility] [address] [place], [place] [address] Phone: [phone]

## 2017-07-23 NOTE — Patient Instructions (Signed)
Medication Instructions:  Your physician recommends that you continue on your current medications as directed. Please refer to the Current Medication list given to you today.   Labwork: Your physician recommends that you return for lab work in: June just before next visit.    Testing/Procedures: NONE   Follow-Up: Your physician recommends that you schedule a follow-up appointment in: June in the [place] office.    Any Other Special Instructions Will Be Listed Below (If Applicable).     If you need a refill on your cardiac medications before your next appointment, please call your pharmacy. Thank you for choosing [facility]!

## 2017-10-07 ENCOUNTER — Encounter (HOSPITAL_COMMUNITY): Payer: Self-pay | Admitting: Emergency Medicine

## 2017-10-07 ENCOUNTER — Emergency Department (HOSPITAL_COMMUNITY): Payer: BLUE CROSS/BLUE SHIELD

## 2017-10-07 ENCOUNTER — Other Ambulatory Visit: Payer: Self-pay

## 2017-10-07 ENCOUNTER — Telehealth: Payer: Self-pay

## 2017-10-07 ENCOUNTER — Observation Stay (HOSPITAL_COMMUNITY)
Admission: EM | Admit: 2017-10-07 | Discharge: 2017-10-08 | Disposition: A | Discharge status: Home / Self Care | Payer: BLUE CROSS/BLUE SHIELD | Attending: Cardiovascular Disease | Admitting: Cardiovascular Disease

## 2017-10-07 ENCOUNTER — Ambulatory Visit (HOSPITAL_COMMUNITY)
Admission: EM | Disposition: A | Discharge status: Home / Self Care | Payer: Self-pay | Source: Home / Self Care | Attending: Emergency Medicine

## 2017-10-07 DIAGNOSIS — E782 Mixed hyperlipidemia: Secondary | ICD-10-CM | POA: Diagnosis not present

## 2017-10-07 DIAGNOSIS — Z8673 Personal history of transient ischemic attack (TIA), and cerebral infarction without residual deficits: Secondary | ICD-10-CM | POA: Insufficient documentation

## 2017-10-07 DIAGNOSIS — R0602 Shortness of breath: Secondary | ICD-10-CM | POA: Diagnosis not present

## 2017-10-07 DIAGNOSIS — Z8674 Personal history of sudden cardiac arrest: Secondary | ICD-10-CM | POA: Insufficient documentation

## 2017-10-07 DIAGNOSIS — Z955 Presence of coronary angioplasty implant and graft: Secondary | ICD-10-CM | POA: Diagnosis not present

## 2017-10-07 DIAGNOSIS — Z888 Allergy status to other drugs, medicaments and biological substances status: Secondary | ICD-10-CM | POA: Insufficient documentation

## 2017-10-07 DIAGNOSIS — Z7982 Long term (current) use of aspirin: Secondary | ICD-10-CM | POA: Insufficient documentation

## 2017-10-07 DIAGNOSIS — Z9889 Other specified postprocedural states: Secondary | ICD-10-CM | POA: Diagnosis not present

## 2017-10-07 DIAGNOSIS — I259 Chronic ischemic heart disease, unspecified: Secondary | ICD-10-CM | POA: Diagnosis not present

## 2017-10-07 DIAGNOSIS — Z8249 Family history of ischemic heart disease and other diseases of the circulatory system: Secondary | ICD-10-CM | POA: Diagnosis not present

## 2017-10-07 DIAGNOSIS — Z885 Allergy status to narcotic agent status: Secondary | ICD-10-CM | POA: Insufficient documentation

## 2017-10-07 DIAGNOSIS — R072 Precordial pain: Secondary | ICD-10-CM | POA: Diagnosis not present

## 2017-10-07 DIAGNOSIS — Z789 Other specified health status: Secondary | ICD-10-CM | POA: Diagnosis not present

## 2017-10-07 DIAGNOSIS — I10 Essential (primary) hypertension: Secondary | ICD-10-CM | POA: Diagnosis not present

## 2017-10-07 DIAGNOSIS — I2510 Atherosclerotic heart disease of native coronary artery without angina pectoris: Secondary | ICD-10-CM | POA: Diagnosis not present

## 2017-10-07 DIAGNOSIS — Z79899 Other long term (current) drug therapy: Secondary | ICD-10-CM | POA: Diagnosis not present

## 2017-10-07 DIAGNOSIS — E669 Obesity, unspecified: Secondary | ICD-10-CM | POA: Insufficient documentation

## 2017-10-07 DIAGNOSIS — E785 Hyperlipidemia, unspecified: Secondary | ICD-10-CM

## 2017-10-07 DIAGNOSIS — I214 Non-ST elevation (NSTEMI) myocardial infarction: Secondary | ICD-10-CM | POA: Diagnosis not present

## 2017-10-07 DIAGNOSIS — R079 Chest pain, unspecified: Secondary | ICD-10-CM | POA: Diagnosis not present

## 2017-10-07 DIAGNOSIS — I252 Old myocardial infarction: Secondary | ICD-10-CM | POA: Diagnosis present

## 2017-10-07 DIAGNOSIS — F17210 Nicotine dependence, cigarettes, uncomplicated: Secondary | ICD-10-CM | POA: Insufficient documentation

## 2017-10-07 DIAGNOSIS — Z6832 Body mass index (BMI) 32.0-32.9, adult: Secondary | ICD-10-CM | POA: Insufficient documentation

## 2017-10-07 HISTORY — PX: CORONARY ANGIOPLASTY WITH STENT PLACEMENT: SHX49

## 2017-10-07 HISTORY — PX: CORONARY STENT INTERVENTION: CATH118234

## 2017-10-07 HISTORY — DX: Non-ST elevation (NSTEMI) myocardial infarction: I21.4

## 2017-10-07 HISTORY — PX: LEFT HEART CATH AND CORONARY ANGIOGRAPHY: CATH118249

## 2017-10-07 HISTORY — DX: Acute embolism and thrombosis of unspecified deep veins of unspecified lower extremity: I82.409

## 2017-10-07 HISTORY — DX: Personal history of other diseases of the digestive system: Z87.19

## 2017-10-07 LAB — COMPREHENSIVE METABOLIC PANEL
ALK PHOS: 75 U/L (ref 38–126)
ALT: 23 U/L (ref 14–54)
AST: 46 U/L — ABNORMAL HIGH (ref 15–41)
Albumin: 4.3 g/dL (ref 3.5–5.0)
Anion gap: 10 (ref 5–15)
BUN: 13 mg/dL (ref 6–20)
CALCIUM: 9.3 mg/dL (ref 8.9–10.3)
CHLORIDE: 106 mmol/L (ref 101–111)
CO2: 24 mmol/L (ref 22–32)
CREATININE: 0.79 mg/dL (ref 0.44–1.00)
GFR calc non Af Amer: >60 mL/min (ref >60)
Glucose, Bld: 148 mg/dL — ABNORMAL HIGH (ref 65–99)
Potassium: 3.8 mmol/L (ref 3.5–5.1)
SODIUM: 140 mmol/L (ref 135–145)
Total Bilirubin: 0.9 mg/dL (ref 0.3–1.2)
Total Protein: 7.4 g/dL (ref 6.5–8.1)

## 2017-10-07 LAB — CREATININE, SERUM
CREATININE: 0.87 mg/dL (ref 0.44–1.00)
GFR calc Af Amer: >60 mL/min (ref >60)
GFR calc non Af Amer: >60 mL/min (ref >60)

## 2017-10-07 LAB — CBC
HEMATOCRIT: 46.1 % — AB (ref 36.0–46.0)
HEMATOCRIT: 43.1 % (ref 36.0–46.0)
Hemoglobin: 15.6 g/dL — ABNORMAL HIGH (ref 12.0–15.0)
Hemoglobin: 14.8 g/dL (ref 12.0–15.0)
MCH: 32.6 pg (ref 26.0–34.0)
MCH: 32.5 pg (ref 26.0–34.0)
MCHC: 33.8 g/dL (ref 30.0–36.0)
MCHC: 34.3 g/dL (ref 30.0–36.0)
MCV: 96.4 fL (ref 78.0–100.0)
MCV: 94.5 fL (ref 78.0–100.0)
Platelets: 314 10*3/uL (ref 150–400)
Platelets: 280 10*3/uL (ref 150–400)
RBC: 4.78 MIL/uL (ref 3.87–5.11)
RBC: 4.56 MIL/uL (ref 3.87–5.11)
RDW: 13.3 % (ref 11.5–15.5)
RDW: 13.2 % (ref 11.5–15.5)
WBC: 13.2 10*3/uL — AB (ref 4.0–10.5)
WBC: 14.7 10*3/uL — ABNORMAL HIGH (ref 4.0–10.5)

## 2017-10-07 LAB — TROPONIN I
Troponin I: 1.9 ng/mL (ref <0.03)
Troponin I: 26.85 ng/mL (ref <0.03)

## 2017-10-07 LAB — POCT ACTIVATED CLOTTING TIME: Activated Clotting Time: 323 seconds

## 2017-10-07 LAB — LIPASE, BLOOD: LIPASE: 24 U/L (ref 11–51)

## 2017-10-07 LAB — D-DIMER, QUANTITATIVE (NOT AT ARMC): D DIMER QUANT: 0.3 ug{FEU}/mL (ref 0.00–0.50)

## 2017-10-07 SURGERY — LEFT HEART CATH AND CORONARY ANGIOGRAPHY
Anesthesia: LOCAL

## 2017-10-07 MED ORDER — MIDAZOLAM HCL 2 MG/2ML IJ SOLN
INTRAMUSCULAR | Status: DC | PRN
  Administered 2017-10-07: 1 mg via INTRAVENOUS

## 2017-10-07 MED ORDER — HEPARIN (PORCINE) IN NACL 2-0.9 UNITS/ML
INTRAMUSCULAR | Status: AC | PRN
  Administered 2017-10-07 (×2): 500 mL

## 2017-10-07 MED ORDER — TICAGRELOR 90 MG PO TABS
ORAL_TABLET | ORAL | Status: AC
  Filled 2017-10-07: qty 1

## 2017-10-07 MED ORDER — NITROGLYCERIN 1 MG/10 ML FOR IR/CATH LAB
INTRA_ARTERIAL | Status: AC
  Filled 2017-10-07: qty 10

## 2017-10-07 MED ORDER — IOHEXOL 350 MG/ML SOLN
INTRAVENOUS | Status: DC | PRN
  Administered 2017-10-07: 105 mL via INTRA_ARTERIAL

## 2017-10-07 MED ORDER — ASPIRIN EC 81 MG PO TBEC
81.0000 mg | DELAYED_RELEASE_TABLET | Freq: Every day | ORAL | Status: DC
  Administered 2017-10-08: 81 mg via ORAL
  Filled 2017-10-07: qty 1

## 2017-10-07 MED ORDER — ONDANSETRON HCL 4 MG/2ML IJ SOLN: 4.0000 mg | Freq: Four times a day (QID) | INTRAMUSCULAR | Status: DC | PRN

## 2017-10-07 MED ORDER — ONDANSETRON HCL 4 MG/2ML IJ SOLN
4.0000 mg | Freq: Once | INTRAMUSCULAR | Status: AC
  Administered 2017-10-07: 4 mg via INTRAVENOUS
  Filled 2017-10-07: qty 2

## 2017-10-07 MED ORDER — ACETAMINOPHEN 325 MG PO TABS: 650.0000 mg | ORAL_TABLET | ORAL | Status: DC | PRN

## 2017-10-07 MED ORDER — TICAGRELOR 90 MG PO TABS
ORAL_TABLET | ORAL | Status: DC | PRN
  Administered 2017-10-07: 180 mg via ORAL

## 2017-10-07 MED ORDER — VERAPAMIL HCL 2.5 MG/ML IV SOLN
INTRAVENOUS | Status: AC
  Filled 2017-10-07: qty 2

## 2017-10-07 MED ORDER — TICAGRELOR 90 MG PO TABS
90.0000 mg | ORAL_TABLET | Freq: Two times a day (BID) | ORAL | Status: DC
  Administered 2017-10-08 (×2): 90 mg via ORAL
  Filled 2017-10-07 (×2): qty 1

## 2017-10-07 MED ORDER — HEPARIN (PORCINE) IN NACL 100-0.45 UNIT/ML-% IJ SOLN
1000.0000 [IU]/h | INTRAMUSCULAR | Status: DC
  Administered 2017-10-07: 1000 [IU]/h via INTRAVENOUS
  Filled 2017-10-07: qty 250

## 2017-10-07 MED ORDER — METOPROLOL SUCCINATE ER 25 MG PO TB24
12.5000 mg | ORAL_TABLET | Freq: Every day | ORAL | Status: DC
  Administered 2017-10-08: 12.5 mg via ORAL
  Filled 2017-10-07: qty 1

## 2017-10-07 MED ORDER — SODIUM CHLORIDE 0.9 % WEIGHT BASED INFUSION
1.0000 mL/kg/h | INTRAVENOUS | Status: AC
  Administered 2017-10-07: 17:00:00 1 mL/kg/h via INTRAVENOUS

## 2017-10-07 MED ORDER — ASPIRIN 81 MG PO CHEW
324.0000 mg | CHEWABLE_TABLET | Freq: Once | ORAL | Status: AC
  Administered 2017-10-07: 324 mg via ORAL
  Filled 2017-10-07: qty 4

## 2017-10-07 MED ORDER — HEPARIN SODIUM (PORCINE) 5000 UNIT/ML IJ SOLN: 5000.0000 [IU] | Freq: Three times a day (TID) | INTRAMUSCULAR | Status: DC

## 2017-10-07 MED ORDER — VERAPAMIL HCL 2.5 MG/ML IV SOLN
INTRAVENOUS | Status: DC | PRN
  Administered 2017-10-07: 10 mL via INTRA_ARTERIAL

## 2017-10-07 MED ORDER — SODIUM CHLORIDE 0.9% FLUSH
3.0000 mL | Freq: Two times a day (BID) | INTRAVENOUS | Status: DC
  Administered 2017-10-08: 3 mL via INTRAVENOUS

## 2017-10-07 MED ORDER — FENTANYL CITRATE (PF) 100 MCG/2ML IJ SOLN
INTRAMUSCULAR | Status: AC
  Filled 2017-10-07: qty 2

## 2017-10-07 MED ORDER — LIDOCAINE HCL (PF) 1 % IJ SOLN
INTRAMUSCULAR | Status: AC
  Filled 2017-10-07: qty 30

## 2017-10-07 MED ORDER — LIDOCAINE HCL (PF) 1 % IJ SOLN
INTRAMUSCULAR | Status: DC | PRN
  Administered 2017-10-07: 2 mL

## 2017-10-07 MED ORDER — NITROGLYCERIN IN D5W 200-5 MCG/ML-% IV SOLN
5.0000 ug/min | INTRAVENOUS | Status: DC
  Administered 2017-10-07: 5 ug/min via INTRAVENOUS
  Filled 2017-10-07: qty 250

## 2017-10-07 MED ORDER — HEPARIN (PORCINE) IN NACL 1000-0.9 UT/500ML-% IV SOLN
INTRAVENOUS | Status: AC
  Filled 2017-10-07: qty 1000

## 2017-10-07 MED ORDER — HEPARIN SODIUM (PORCINE) 1000 UNIT/ML IJ SOLN
INTRAMUSCULAR | Status: DC | PRN
  Administered 2017-10-07: 4500 [IU] via INTRAVENOUS
  Administered 2017-10-07: 3000 [IU] via INTRAVENOUS

## 2017-10-07 MED ORDER — SODIUM CHLORIDE 0.9 % IV SOLN: 250.0000 mL | INTRAVENOUS | Status: DC | PRN

## 2017-10-07 MED ORDER — FENTANYL CITRATE (PF) 100 MCG/2ML IJ SOLN
INTRAMUSCULAR | Status: DC | PRN
  Administered 2017-10-07: 25 ug via INTRAVENOUS

## 2017-10-07 MED ORDER — HEPARIN BOLUS VIA INFUSION
4000.0000 [IU] | Freq: Once | INTRAVENOUS | Status: AC
  Administered 2017-10-07: 4000 [IU] via INTRAVENOUS

## 2017-10-07 MED ORDER — HEPARIN SODIUM (PORCINE) 1000 UNIT/ML IJ SOLN
INTRAMUSCULAR | Status: AC
  Filled 2017-10-07: qty 1

## 2017-10-07 MED ORDER — METOPROLOL SUCCINATE ER 25 MG PO TB24
12.5000 mg | ORAL_TABLET | Freq: Every day | ORAL | Status: DC
  Filled 2017-10-07: qty 1

## 2017-10-07 MED ORDER — NITROGLYCERIN 0.4 MG SL SUBL
0.4000 mg | SUBLINGUAL_TABLET | SUBLINGUAL | Status: DC | PRN
  Administered 2017-10-07: 0.4 mg via SUBLINGUAL
  Filled 2017-10-07: qty 1

## 2017-10-07 MED ORDER — NITROGLYCERIN 0.4 MG SL SUBL: 0.4000 mg | SUBLINGUAL_TABLET | SUBLINGUAL | Status: DC | PRN

## 2017-10-07 MED ORDER — ASPIRIN EC 81 MG PO TBEC: 81.0000 mg | DELAYED_RELEASE_TABLET | Freq: Every day | ORAL | Status: DC

## 2017-10-07 MED ORDER — SODIUM CHLORIDE 0.9% FLUSH: 3.0000 mL | INTRAVENOUS | Status: DC | PRN

## 2017-10-07 MED ORDER — MIDAZOLAM HCL 2 MG/2ML IJ SOLN
INTRAMUSCULAR | Status: AC
  Filled 2017-10-07: qty 2

## 2017-10-07 SURGICAL SUPPLY — 17 items
BALLN SAPPHIRE 2.0X12 (BALLOONS) ×2
BALLOON SAPPHIRE 2.0X12 (BALLOONS) IMPLANT
CATH 5FR JL3.5 JR4 ANG PIG MP (CATHETERS) ×1 IMPLANT
CATH LAUNCHER 5F EBU3.5 (CATHETERS) ×1 IMPLANT
DEVICE RAD COMP TR BAND LRG (VASCULAR PRODUCTS) ×1 IMPLANT
GLIDESHEATH SLEND SS 6F .021 (SHEATH) ×1 IMPLANT
GUIDEWIRE INQWIRE 1.5J.035X260 (WIRE) IMPLANT
INQWIRE 1.5J .035X260CM (WIRE) ×2
KIT ENCORE 26 ADVANTAGE (KITS) ×1 IMPLANT
KIT HEART LEFT (KITS) ×2 IMPLANT
PACK CARDIAC CATHETERIZATION (CUSTOM PROCEDURE TRAY) ×2 IMPLANT
STENT SYNERGY DES 2.5X12 (Permanent Stent) ×1 IMPLANT
SYR MEDRAD MARK V 150ML (SYRINGE) ×2 IMPLANT
TRANSDUCER W/STOPCOCK (MISCELLANEOUS) ×2 IMPLANT
TUBING CIL FLEX 10 FLL-RA (TUBING) ×2 IMPLANT
WIRE ASAHI PROWATER 180CM (WIRE) ×1 IMPLANT
WIRE HI TORQ VERSACORE-J 145CM (WIRE) ×1 IMPLANT

## 2017-10-07 NOTE — ED Triage Notes (Signed)
Patient complaining of mid sternal chest pain radiating into back with shortness of breath and vomiting since 0200 this morning.

## 2017-10-07 NOTE — Interval H&P Note (Signed)
History and Physical Interval Note:  10/07/2017 2:42 PM  Stacy Douglas  has presented today for surgery, with the diagnosis of cp  The various methods of treatment have been discussed with the patient and family. After consideration of risks, benefits and other options for treatment, the patient has consented to  Procedure(s): LEFT HEART CATH AND CORONARY ANGIOGRAPHY (N/A) as a surgical intervention .  The patient's history has been reviewed, patient examined, no change in status, stable for surgery.  I have reviewed the patient's chart and labs.  Questions were answered to the patient's satisfaction.   Cath Lab Visit (complete for each Cath Lab visit)  Clinical Evaluation Leading to the Procedure:   ACS: Yes.    Non-ACS:    Anginal Classification: CCS IV  Anti-ischemic medical therapy: Maximal Therapy (2 or more classes of medications)  Non-Invasive Test Results: No non-invasive testing performed  Prior CABG: No previous CABG        Theron Aristaeter [facility]JordanMD,FACC 10/07/2017 2:43 PM

## 2017-10-07 NOTE — ED Notes (Signed)
Dr. Rennis GoldenHilty sent to [facility] Idol [phone].

## 2017-10-07 NOTE — ED Provider Notes (Signed)
[facility] EMERGENCY DEPARTMENT Provider Note   CSN: [id number] Arrival date & time: 10/07/17  1039     History   Chief Complaint Chief Complaint  Patient presents with  . Chest Pain    HPI Stacy Douglas is a 67 y.o. female with past medical history including MI with multiple stents, LAD and circumflex in 2011 in [place], then RCA here in 2016, also with HTN, hyperlipidemia and a distant h/o dvt (approx 30 years ago)  presenting with midsternal chest pressure in association with nausea and multiple episodes of emesis in association with exertional dyspnea.  Sx woke her around 2 am.  She did not notice the dyspnea until trying to walk her dog this am. Her sx are not similar to prior MI, reports then had tingling in her shoulders. She denies palpitations, leg pain or swelling.  She reports feeling generally fatigued 2 days ago at work, but felt fine yesterday without complaint.  She has had no treatment prior to arrival.  The history is provided by the patient.    Past Medical History:  Diagnosis Date  . Coronary atherosclerosis of native coronary artery    a. VF arrest in [place] s/p BMS LAD 5/11, PTCA circumflex 6/11, LVEF 40%. b. [place] 10/2014 s/p DES to prox and mid RCA, otherwise patent LAD stent and moderate disease in the Cx, LVEF 55-65%.  . Essential hypertension   . Mixed hyperlipidemia   . Myocardial infarction (HCC)    5/11, complicated by VF arrest  . Obesity   . Plavix resistance   . Tobacco abuse     Patient Active Problem List   Diagnosis Date Noted  . Gastroenteritis 08/03/2016  . Unstable angina (HCC) 11/15/2014  . Essential hypertension 11/15/2014  . Coronary atherosclerosis of native coronary artery 02/20/2011  . Hyperlipidemia with target LDL less than 100 02/20/2011  . Tobacco use disorder 02/20/2011    Past Surgical History:  Procedure Laterality Date  . APPENDECTOMY    . CARDIAC CATHETERIZATION N/A 11/16/2014   Procedure: Left Heart Cath and  Coronary Angiography;  Surgeon: Iran Ouch, MD;  Location: [facility]IVE [facility];  Service: Cardiovascular;  Laterality: N/A;  . CARDIAC CATHETERIZATION N/A 11/16/2014   Procedure: Coronary Stent Intervention;  Surgeon: Iran Ouch, MD;  Location: [facility] CV LAB;  Service: Cardiovascular;  Laterality: N/A;  . CERVICAL SPINE SURGERY    . CORONARY ANGIOPLASTY WITH STENT PLACEMENT    . Right breast biopsy       OB History   None      Home Medications    Prior to Admission medications   Medication Sig Start Date End Date Taking? Authorizing Provider  aspirin EC 81 MG tablet Take 81 mg by mouth daily.   Yes [provider]  metoprolol succinate (TOPROL XL) 25 MG 24 hr tablet Take 0.5 tablets (12.5 mg total) by mouth daily. 07/01/17  Yes Dunn, Dayna N, PA-C  nitroGLYCERIN (NITROSTAT) 0.4 MG SL tablet Place 1 tablet (0.4 mg total) under the tongue every 5 (five) minutes x 3 doses as needed. 04/14/17  Yes Jonelle Sidle, MD    Family History Family History  Problem Relation Age of Onset  . Coronary artery disease Father        MI age 6  . Diverticulitis Mother     Social History Social History   Tobacco Use  . Smoking status: Current Some Day Smoker    Packs/day: 0.50    Types: Cigarettes  Start date: 08/10/1964    Last attempt to quit: 08/05/2016    Years since quitting: 1.1  . Smokeless tobacco: Former NeurosurgeonUser    Quit date: 05/24/2015  . Tobacco comment: 5  ciggs per day  Substance Use Topics  . Alcohol use: No    Alcohol/week: 0.0 oz  . Drug use: No     Allergies   Crestor [rosuvastatin calcium] and Codeine   Review of Systems Review of Systems  Constitutional: Negative for fever.  HENT: Negative for congestion and sore throat.   Eyes: Negative.   Respiratory: Positive for chest tightness and shortness of breath.   Cardiovascular: Positive for chest pain. Negative for palpitations and leg swelling.  Gastrointestinal: Positive for abdominal  pain, nausea and vomiting.  Genitourinary: Negative.   Musculoskeletal: Negative for arthralgias, joint swelling and neck pain.  Skin: Negative.  Negative for rash and wound.  Neurological: Negative for dizziness, weakness, light-headedness, numbness and headaches.  Psychiatric/Behavioral: Negative.      Physical Exam Updated Vital Signs BP 138/61   Pulse 77   Temp 98.1 F (36.7 C) (Oral)   Resp (!) 23   Ht 5\' 4"  (1.626 m)   Wt 86.2 kg (190 lb)   SpO2 100%   BMI 32.61 kg/m   Physical Exam  Constitutional: She appears well-developed and well-nourished.  HENT:  Head: Normocephalic and atraumatic.  Eyes: Conjunctivae are normal.  Neck: Normal range of motion.  Cardiovascular: Normal rate, regular rhythm, normal heart sounds and intact distal pulses.  Pulmonary/Chest: Effort normal and breath sounds normal. She has no decreased breath sounds. She has no wheezes. She has no rhonchi. She has no rales.  Abdominal: Soft. Bowel sounds are normal. There is tenderness in the right upper quadrant, epigastric area and left upper quadrant. There is no rebound, no guarding and negative Murphy's sign.  Mild upper abd discomfort with palpation (pt believes abd is sore from emesis).  Musculoskeletal: Normal range of motion.  Neurological: She is alert.  Skin: Skin is warm and dry.  Psychiatric: She has a normal mood and affect.  Nursing note and vitals reviewed.    ED Treatments / Results  Labs (all labs ordered are listed, but only abnormal results are displayed) Labs Reviewed  CBC - Abnormal; Notable for the following components:      Result Value   WBC 13.2 (*)    Hemoglobin 15.6 (*)    HCT 46.1 (*)    All other components within normal limits  TROPONIN I - Abnormal; Notable for the following components:   Troponin I 1.90 (*)    All other components within normal limits  COMPREHENSIVE METABOLIC PANEL - Abnormal; Notable for the following components:   Glucose, Bld 148 (*)     AST 46 (*)    All other components within normal limits  D-DIMER, QUANTITATIVE (NOT AT [facility])  LIPASE, BLOOD    EKG EKG Interpretation  Date/Time:  Tuesday October 07 2017 10:49:57 EDT Ventricular Rate:  72 PR Interval:    QRS Duration: 107 QT Interval:  389 QTC Calculation: 426 R Axis:   -21 Text Interpretation:  Sinus rhythm Low voltage, extremity and precordial leads Minimal ST depression, anterolateral leads Confirmed by Rolland PorterJames, Mark ([id number]) on 10/07/2017 10:53:14 AM   Radiology Dg Chest 2 View  Result Date: 10/07/2017 CLINICAL DATA:  Chest pain EXAM: CHEST - 2 VIEW COMPARISON:  08/12/2016 FINDINGS: Lungs are clear.  No pleural effusion or pneumothorax. The heart is normal in size. Visualized  osseous structures are within normal limits. IMPRESSION: Normal chest radiographs. Electronically Signed   By: Charline Bills M.D.   On: 10/07/2017 12:02    Procedures Procedures (including critical care time)  Medications Ordered in ED Medications  aspirin chewable tablet 324 mg (has no administration in time range)  nitroGLYCERIN (NITROSTAT) SL tablet 0.4 mg (0.4 mg Sublingual Given 10/07/17 1208)  heparin ADULT infusion 100 units/mL (25000 units/288mL sodium chloride 0.45%) (has no administration in time range)  heparin bolus via infusion 4,000 Units (has no administration in time range)  nitroGLYCERIN 50 mg in dextrose 5 % 250 mL (0.2 mg/mL) infusion (has no administration in time range)  ondansetron (ZOFRAN) injection 4 mg (4 mg Intravenous Given 10/07/17 1225)     Initial Impression / Assessment and Plan / ED Course  I have reviewed the triage vital signs and the nursing notes.  Pertinent labs & imaging results that were available during my care of the patient were reviewed by me and considered in my medical decision making (see chart for details).     Labs reviewed, pt with acute nstemi.  Discussed with Dr. Purvis Sheffield who agrees pt will need transfer to [facility] for cath.  He will  see pt in the ed now, but advised to arrange for transportation.  Heparin and ntg drips started.  Spoke with Dr. Rennis Golden at [facility]. Pt will transport to the cath lab, bed request per [facility].   Pt received asa 324 mg.    CRITICAL CARE Performed by: Burgess Amor Total critical care time: 30 minutes Critical care time was exclusive of separately billable procedures and treating other patients. Critical care was necessary to treat or prevent imminent or life-threatening deterioration. Critical care was time spent personally by me on the following activities: development of treatment plan with patient and/or surrogate as well as nursing, discussions with consultants, evaluation of patient's response to treatment, examination of patient, obtaining history from patient or surrogate, ordering and performing treatments and interventions, ordering and review of laboratory studies, ordering and review of radiographic studies, pulse oximetry and re-evaluation of patient's condition.   Final Clinical Impressions(s) / ED Diagnoses   Final diagnoses:  NSTEMI (non-ST elevated myocardial infarction) [facility])    ED Discharge Orders    None       Victoriano Lain 10/07/17 1256    Rolland Porter, MD 10/07/17 [phone]

## 2017-10-07 NOTE — ED Notes (Signed)
Doug with [facility] called Dr. Rennis GoldenHilty paged will re-page if no answer in the next 15 minutes.

## 2017-10-07 NOTE — Telephone Encounter (Signed)
   Agree with recommendations as at the time of her last visit in 06/2017 she had not experienced any recurrent symptoms.   Signed, Ellsworth LennoxBrittany M Ariadna Setter, PA-C 10/07/2017, 10:52 AM

## 2017-10-07 NOTE — ED Notes (Signed)
Spoke with [facility] patient going straight to Cathlab.

## 2017-10-07 NOTE — H&P (Addendum)
History & Physical    Patient ID: Stacy RalphsLisa S Douglas MRN: [id number], DOB/AGE: [birth year]   Admit date: 10/07/2017  Primary Care Provider: Bennie PieriniMartin, [name], FNP Primary Cardiologist: Nona DellSamuel McDowell, MD   Chief Complaint: Chest Pain/NSTEMI  Patient Profile    Stacy Douglas is a 67 y.o. female with past medical history of of CAD (s/p VF arrest in [place] in 08/2009 with BMS to LAD, PTCA of LCx in 09/2009 and DES to prox and mid-RCA in 2016), HTN, HLD, and tobacco use who is being evaluated today for chest pain at the request of Dr. Fayrene FearingJames.   History of Present Illness    Stacy Douglas reports being in her usual state of health until around 0200 this morning when she awoke from sleep due to new onset chest discomfort. She reports associated nausea and diaphoresis at that time which kept her from returning to sleep. Her pain persisted until this morning, therefore she called our office who recommended she proceed to the ED for further evaluation.  She had been in her usual state of health over the past few weeks and denies any repeat episodes of chest pain during this timeframe. No recent dyspnea on exertion, orthopnea, PND, or lower extremity edema. She has experienced occasional twinges of pain along her shoulder blades bilaterally which has been occurring for the past several months and led to a recent NST in 06/2017 which was low-risk.   Initial labs in the ED showed WBC 13.2, Hgb 15.6, platelets 314, Na+ 140, K+ 3.8, creatinine 0.79.  D-dimer negative. Initial troponin found to be elevated to 1.90. CXR shows no acute cardiopulmonary abnormalities. EKG shows normal sinus rhythm, heart rate 72, with new ST depression along anterior leads.  She reports that her pain has improved with sublingual nitroglycerin but is still present at this time.  She is currently being started on IV heparin.   Past Medical History:  Diagnosis Date  . Coronary atherosclerosis of native coronary artery    a. VF arrest in [place] s/p BMS LAD 5/11, PTCA circumflex 6/11, LVEF 40%. b[place] 10[place]2014 s/p DES to prox and mid RCA, otherwise patent LAD stent and moderate disease in the Cx, LVEF 55-65%.  . Essential hypertension   . Mixed hyperlipidemia   . Myocardial infarction (HCC)    5/11, complicated by VF arrest  . Obesity   . Plavix resistance   . Tobacco abuse     Past Surgical History:  Procedure Laterality Date  . APPENDECTOMY    . CARDIAC CATHETERIZATION N/A 11/16/2014   Procedure: Left Heart Cath and Coronary Angiography;  Surgeon: Iran OuchMuhammad A Arida, MD;  Location: [facility];  Service: Cardiovascular;  Laterality: N/A;  . CARDIAC CATHETERIZATION N/A 11/16/2014   Procedure: Coronary Stent Intervention;  Surgeon: Iran OuchMuhammad A Arida, MD;  Location: [facility];  Service: Cardiovascular;  Laterality: N/A;  . CERVICAL SPINE SURGERY    . CORONARY ANGIOPLASTY WITH STENT PLACEMENT    . Right breast biopsy       Medications Prior to Admission: Prior to Admission medications   Medication Sig Start Date End Date Taking? Authorizing Provider  aspirin EC 81 MG tablet Take 81 mg by mouth daily.   Yes [provider]  metoprolol succinate (TOPROL XL) 25 MG 24 hr tablet Take 0.5 tablets (12.5 mg total) by mouth daily. 07/01/17  Yes Dunn, Dayna N, PA-C  nitroGLYCERIN (NITROSTAT) 0.4 MG SL tablet Place 1 tablet (0.4 mg total) under the  tongue every 5 (five) minutes x 3 doses as needed. 04/14/17  Yes Jonelle Sidle, MD     Allergies:    Allergies  Allergen Reactions  . Crestor [Rosuvastatin Calcium] Other (See Comments)    Myalgias  . Codeine Nausea And Vomiting    Social History:   Social History   Socioeconomic History  . Marital status: Divorced    Spouse name: Not on file  . Number of children: Not on file  . Years of education: Not on file  . Highest education level: Not on file  Occupational History  . Occupation: [employer]  . Financial resource  strain: Not on file  . Food insecurity:    Worry: Not on file    Inability: Not on file  . Transportation needs:    Medical: Not on file    Non-medical: Not on file  Tobacco Use  . Smoking status: Current Some Day Smoker    Packs/day: 0.50    Types: Cigarettes    Start date: 08/10/1964    Last attempt to quit: 08/05/2016    Years since quitting: 1.1  . Smokeless tobacco: Former Neurosurgeon    Quit date: 05/24/2015  . Tobacco comment: 5  ciggs per day  Substance and Sexual Activity  . Alcohol use: No    Alcohol/week: 0.0 oz  . Drug use: No  . Sexual activity: Not on file  Lifestyle  . Physical activity:    Days per week: Not on file    Minutes per session: Not on file  . Stress: Not on file  Relationships  . Social connections:    Talks on phone: Not on file    Gets together: Not on file    Attends religious service: Not on file    Active member of club or organization: Not on file    Attends meetings of clubs or organizations: Not on file    Relationship status: Not on file  . Intimate partner violence:    Fear of current or ex partner: Not on file    Emotionally abused: Not on file    Physically abused: Not on file    Forced sexual activity: Not on file  Other Topics Concern  . Not on file  Social History Narrative  . Not on file      Family History:   The patient's family history includes Coronary artery disease in her father; Diverticulitis in her mother.     Review of Systems    General:  No chills, fever, night sweats or weight changes.  Cardiovascular:  No dyspnea on exertion, edema, orthopnea, palpitations, paroxysmal nocturnal dyspnea. Positive for chest pain.  Dermatological: No rash, lesions/masses Respiratory: No cough, dyspnea Urologic: No hematuria, dysuria Abdominal:   No diarrhea, bright red blood per rectum, melena, or hematemesis. Positive for nausea and vomiting.  Neurologic:  No visual changes, wkns, changes in mental status. All other systems  reviewed and are otherwise negative except as noted above.  Physical Exam    Vitals:   10/07/17 1046 10/07/17 1049 10/07/17 1100 10/07/17 1200  BP:  (!) 150/76  138/61  Pulse:  73 79 77  Resp:  (!) 22 18 (!) 23  Temp:  98.1 F (36.7 C)    TempSrc:  Oral    SpO2:  99% 98% 100%  Weight: 190 lb (86.2 kg)     Height: 5\' 4"  (1.626 m)      No intake or output data in the 24  hours ending 10/07/17 1246 Filed Weights   10/07/17 1046  Weight: 190 lb (86.2 kg)   Body mass index is 32.61 kg/m.   General: Well developed, well nourished Caucasian female appearing in no acute distress. Head: Normocephalic, atraumatic, sclera non-icteric, no xanthomas, nares are without discharge. Dentition:  Neck: No carotid bruits. JVD not elevated.  Lungs: Respirations regular and unlabored, without wheezes or rales.  Heart: Regular rate and rhythm. No S3 or S4.  No murmur, no rubs, or gallops appreciated. Abdomen: Soft, non-tender, non-distended with normoactive bowel sounds. No hepatomegaly. No rebound/guarding. No obvious abdominal masses. Msk:  Strength and tone appear normal for age. No joint deformities or effusions. Extremities: No clubbing or cyanosis. No edema.  Distal pedal pulses are 2+ bilaterally. Neuro: Alert and oriented X 3. Moves all extremities spontaneously. No focal deficits noted. Psych:  Responds to questions appropriately with a normal affect. Skin: No rashes or lesions noted  Labs and Radiology Studies    EKG:  The ECG that was done was personally reviewed and demonstrates NSR, HR 72, with new ST depression along anterior leads.   Relevant CV Studies:  NST: 06/2017  Blood pressure demonstrated a normal response to exercise.  There was no ST segment deviation noted during stress.  The study is normal. There are no perfusion defects  This is a low risk study.  The left ventricular ejection fraction is hyperdynamic (>65%).  [facility] treadmill score of 7 supports low risk for  major cardiac events.  Laboratory Data:  Chemistry Recent Labs  Lab 10/07/17 1128  NA 140  K 3.8  CL 106  CO2 24  GLUCOSE 148*  BUN 13  CREATININE 0.79  CALCIUM 9.3  GFRNONAA >60  GFRAA >60  ANIONGAP 10    Recent Labs  Lab 10/07/17 1128  PROT 7.4  ALBUMIN 4.3  AST 46*  ALT 23  ALKPHOS 75  BILITOT 0.9   Hematology Recent Labs  Lab 10/07/17 1057  WBC 13.2*  RBC 4.78  HGB 15.6*  HCT 46.1*  MCV 96.4  MCH 32.6  MCHC 33.8  RDW 13.3  PLT 314   Cardiac Enzymes Recent Labs  Lab 10/07/17 1057  TROPONINI 1.90*   No results for input(s): TROPIPOC in the last 168 hours.  BNPNo results for input(s): BNP, PROBNP in the last 168 hours.  DDimer  Recent Labs  Lab 10/07/17 1128  DDIMER 0.30    Radiology/Studies:  Dg Chest 2 View  Result Date: 10/07/2017 CLINICAL DATA:  Chest pain EXAM: CHEST - 2 VIEW COMPARISON:  08/12/2016 FINDINGS: Lungs are clear.  No pleural effusion or pneumothorax. The heart is normal in size. Visualized osseous structures are within normal limits. IMPRESSION: Normal chest radiographs. Electronically Signed   By: Charline Bills M.D.   On: 10/07/2017 12:02    Assessment and Plan:   1. NSTEMI/ CAD - s/p VF arrest in [place] in 08/2009 with BMS to LAD, PTCA of LCx in 09/2009 and DES to prox and mid-RCA in 2016. Presents with new-onset chest pain which awoke her from sleep with associated nausea and diaphoresis.  - Initial troponin found to be elevated to 1.90 and EKG shows normal sinus rhythm, heart rate 72, with slight ST depression along anterior leads. - with her presenting symptoms and elevated cardiac enzymes, will plan for transfer to [facility] for a cardiac catheterization later today. The patient understands that risks include but are not limited to stroke (1 in 1000), death (1 in 1000), kidney failure [  usually temporary] (1 in 500), bleeding (1 in 200), allergic reaction [possibly serious] (1 in 200).  - will continue to cycle  cardiac enzymes. Continue Heparin and IV NTG along with ASA and BB therapy. Intolerant to statins in the past.   2. HTN - continue PTA Toprol-XL 12.5mg  daily.   3. HLD - She has been intolerant to multiple statins in the past. Was recently started on Zetia 10 mg daily in 06/2017. Recheck FLP. Will likely need to be referred to the [facility] as an outpatient for initiation of PCSK-9 inhibitor.    For questions or updates, please contact [facility] Please consult [email or web address] for contact info under Cardiology/STEMI.   Signed, Ellsworth Lennox, PA-C 10/07/2017, 12:46 PM Pager: [phone]  The patient was seen and examined, and I agree with the history, physical exam, assessment and plan as documented above, with modifications as noted below. I have also personally reviewed all relevant documentation, old records, labs, and both radiographic and cardiovascular studies. I have also independently interpreted old and new ECG's.  Briefly, this is a 67 year old woman with a history of coronary artery disease status post VF arrest in [place] in May 2011 with multi-vessel PCI to the LAD, circumflex, and RCA as detailed above.  I was called by Burgess Amor PA-C in the [facility] ED to evaluate her.  She was awoken at 2 AM with chest pain this morning.  It radiated to her jaw and was accompanied by nausea and diaphoresis.  She presented to the ED with an elevated troponin of 1.9.  She underwent a low risk nuclear stress test in May 2019.  I personally reviewed the ECG performed today which demonstrates sinus rhythm with nearly 1 mm horizontal ST segment depression in lead V2 with nonspecific changes otherwise.  I reviewed the chest x-ray which was normal.  Presentation is consistent with a non-STEMI.  She is currently on IV heparin and IV nitroglycerin 5 mcg/min.  Her nausea has resolved and intensity of chest pain has improved.  She is hemodynamically stable.  She is receiving aspirin  and beta-blocker therapy.  She is statin intolerant.  LDL 99 on 07/31/2016.  She is on Zetia.  She will likely require PCSK-9 inhibitor therapy.    She is being transferred directly to [facility] for coronary angiography.  I have also spoken with Dr. Rennis Golden, the accepting physician.    Prentice Docker, MD, [facility]  10/07/2017 2:23 PM

## 2017-10-07 NOTE — ED Notes (Signed)
[facility]

## 2017-10-07 NOTE — Telephone Encounter (Signed)
Patient called to state she has sensation of weight on her cheat all night long , with vomiting.I advised her to go direct to ED, she agrees

## 2017-10-07 NOTE — ED Provider Notes (Signed)
   67 year old female presents with chest pain.  Patient discussed with Burgess AmorJulie Idol, PA-C.  Chest pain starting at 2:00 this morning with nausea few episodes of emesis.  Extensive previous cardiac history including PTCA x3.  . Coronary atherosclerosis of native coronary artery    a. VF arrest in [place] s/p BMS LAD 5/11, PTCA circumflex 6/11, LVEF 40%. b. [place] 10[place] s/p DES to prox and mid RCA, otherwise patent LAD stent and moderate disease in the Cx, LVEF 55-65%.   Repeat cath 11/16/2014 with recurrent angina symptoms.   Cardiac catheterization 11/16/2014:  Mid RCA lesion, 60% stenosed. The lesion was not previously treated.  Acute Mrg lesion, 60% stenosed.  Prox LAD lesion, 30% stenosed.  Prox LAD to Mid LAD lesion, 20% stenosed. The lesion was previously treated with a stent (unknown type) greater than two years ago.  Ost 1st Diag lesion, 60% stenosed.  Prox Cx lesion, 40% stenosed.  Mid Cx lesion, 50% stenosed.  The left ventricular systolic function is normal.  Prox RCA lesion, 90% stenosed. There is a 0% residual stenosis post intervention. The lesion was not previously treated.  A drug-eluting stent was placed.  1. Patent LAD stent with moderate disease in the left circumflex. Severe proximal RCA and moderate mid RCA disease likely the culprit for unstable angina. 2. Normal LV systolic function and left ventricular end-diastolic pressure. 3. Successful angioplasty and drug-eluting stent placement to the proximal and mid RCA  No recent anginal symptoms.  Awakened 2 AM with pain.  EKG shows ST depressions V1 and V2 subtle but change versus most recent comparison.  Troponin elevated 1.9.  Given aspirin.  Given sublingual nitro.  Minimal to no improvement.  Continued with nausea.  Difficult IV access.  Ultimately given IV Zofran.  Has been given heparin bolus.  Will start on nitroglycerin infusion with second IV.  In STEMI with ongoing symptoms.  Heparin bolus.  Awaiting  nitroglycerin infusion.  He dynamically stable.  Will need transfer for probable angiogram.  Call placed to cardiology.  CRITICAL CARE Performed by: Claudean KindsMark Joseph Vietta Bonifield   Total critical care time: 30 minutes  Critical care time was exclusive of separately billable procedures and treating other patients.  Critical care was necessary to treat or prevent imminent or life-threatening deterioration.  Critical care was time spent personally by me on the following activities: development of treatment plan with patient and/or surrogate as well as nursing, discussions with consultants, evaluation of patient's response to treatment, examination of patient, obtaining history from patient or surrogate, ordering and performing treatments and interventions, ordering and review of laboratory studies, ordering and review of radiographic studies, pulse oximetry and re-evaluation of patient's condition.    Rolland PorterJames, Maylen Waltermire, MD 10/07/17 1234

## 2017-10-07 NOTE — ED Notes (Signed)
Date and time results received: 10/07/17 12:01 PM  (use smartphrase ".now" to insert current time)  Test: Troponin Critical Value: 1.90  Name of Provider Notified: Fayrene FearingJames  Orders Received? Or Actions Taken?: Orders Received - See Orders for details

## 2017-10-07 NOTE — ED Notes (Signed)
Date and time results received: 10/07/17 12:05 PM   Test: troponin Critical Value: 1.90  Name of Provider Notified: MD Fayrene FearingJames  Orders Received? Or Actions Taken?:MD notified

## 2017-10-07 NOTE — ED Notes (Signed)
Spoke with [facility] to page Cardi[facility] at [facility] per Burgess AmorJulie Idol PA.

## 2017-10-08 ENCOUNTER — Encounter (HOSPITAL_COMMUNITY): Payer: Self-pay | Admitting: Cardiology

## 2017-10-08 ENCOUNTER — Observation Stay (HOSPITAL_COMMUNITY): Payer: BLUE CROSS/BLUE SHIELD

## 2017-10-08 ENCOUNTER — Telehealth: Payer: Self-pay | Admitting: Physician Assistant

## 2017-10-08 DIAGNOSIS — Z955 Presence of coronary angioplasty implant and graft: Secondary | ICD-10-CM | POA: Diagnosis not present

## 2017-10-08 DIAGNOSIS — I214 Non-ST elevation (NSTEMI) myocardial infarction: Secondary | ICD-10-CM | POA: Diagnosis not present

## 2017-10-08 DIAGNOSIS — I25110 Atherosclerotic heart disease of native coronary artery with unstable angina pectoris: Secondary | ICD-10-CM | POA: Diagnosis not present

## 2017-10-08 DIAGNOSIS — I10 Essential (primary) hypertension: Secondary | ICD-10-CM | POA: Diagnosis not present

## 2017-10-08 DIAGNOSIS — R0602 Shortness of breath: Secondary | ICD-10-CM | POA: Diagnosis not present

## 2017-10-08 DIAGNOSIS — I2510 Atherosclerotic heart disease of native coronary artery without angina pectoris: Secondary | ICD-10-CM | POA: Diagnosis not present

## 2017-10-08 DIAGNOSIS — E785 Hyperlipidemia, unspecified: Secondary | ICD-10-CM | POA: Diagnosis not present

## 2017-10-08 LAB — CBC
HCT: 40.3 % (ref 36.0–46.0)
Hemoglobin: 13.7 g/dL (ref 12.0–15.0)
MCH: 32.5 pg (ref 26.0–34.0)
MCHC: 34 g/dL (ref 30.0–36.0)
MCV: 95.7 fL (ref 78.0–100.0)
PLATELETS: 271 10*3/uL (ref 150–400)
RBC: 4.21 MIL/uL (ref 3.87–5.11)
RDW: 13.4 % (ref 11.5–15.5)
WBC: 9.9 10*3/uL (ref 4.0–10.5)

## 2017-10-08 LAB — LIPID PANEL
CHOL/HDL RATIO: 4.2 ratio
CHOLESTEROL: 154 mg/dL (ref 0–200)
HDL: 37 mg/dL — ABNORMAL LOW (ref >40)
LDL CALC: 94 mg/dL (ref 0–99)
Triglycerides: 117 mg/dL (ref <150)
VLDL: 23 mg/dL (ref 0–40)

## 2017-10-08 LAB — BASIC METABOLIC PANEL
Anion gap: 9 (ref 5–15)
BUN: 9 mg/dL (ref 6–20)
CHLORIDE: 107 mmol/L (ref 101–111)
CO2: 26 mmol/L (ref 22–32)
Calcium: 8.7 mg/dL — ABNORMAL LOW (ref 8.9–10.3)
Creatinine, Ser: 0.88 mg/dL (ref 0.44–1.00)
GFR calc Af Amer: >60 mL/min (ref >60)
GFR calc non Af Amer: >60 mL/min (ref >60)
GLUCOSE: 111 mg/dL — AB (ref 65–99)
POTASSIUM: 4 mmol/L (ref 3.5–5.1)
Sodium: 142 mmol/L (ref 135–145)

## 2017-10-08 LAB — TROPONIN I
Troponin I: 28.43 ng/mL (ref <0.03)
Troponin I: 16.9 ng/mL (ref <0.03)

## 2017-10-08 MED ORDER — NITROGLYCERIN 0.4 MG SL SUBL: 0.4000 mg | SUBLINGUAL_TABLET | SUBLINGUAL | 3 refills | Status: DC | PRN

## 2017-10-08 MED ORDER — TICAGRELOR 90 MG PO TABS: 90.0000 mg | ORAL_TABLET | Freq: Two times a day (BID) | ORAL | 0 refills | Status: DC

## 2017-10-08 MED ORDER — ANGIOPLASTY BOOK
Freq: Once | Status: DC
  Filled 2017-10-08: qty 1

## 2017-10-08 MED ORDER — TICAGRELOR 90 MG PO TABS: 90.0000 mg | ORAL_TABLET | Freq: Two times a day (BID) | ORAL | 3 refills | Status: DC

## 2017-10-08 MED FILL — Heparin Sod (Porcine)-NaCl IV Soln 1000 Unit/500ML-0.9%: INTRAVENOUS | Qty: 1000 | Status: AC

## 2017-10-08 MED FILL — Nitroglycerin IV Soln 100 MCG/ML in D5W: INTRA_ARTERIAL | Qty: 10 | Status: AC

## 2017-10-08 NOTE — Telephone Encounter (Signed)
**Note De-identified Lemon Whitacre Obfuscation** The pt has not been discharged from the hospital yet. Will call later this week. 

## 2017-10-08 NOTE — Discharge Instructions (Signed)
PLEASE REMEMBER TO BRING ALL OF YOUR MEDICATIONS TO EACH OF YOUR FOLLOW-UP OFFICE VISITS.  PLEASE ATTEND ALL SCHEDULED FOLLOW-UP APPOINTMENTS.   Activity: Increase activity slowly as tolerated. You may shower, but no soaking baths (or swimming) for 1 week. No driving for 24 hours. No lifting over 5 lbs for 1 week. No sexual activity for 1 week.   You May Return to Work: in 1 week (if applicable)  Wound Care: You may wash cath site gently with soap and water. Keep cath site clean and dry. If you notice pain, swelling, bleeding or pus at your cath site, please call [phone].  It is VERY IMPORTANT that you do not miss ANY doses of aspirin and Brilinta. Missing doses of these medications put you at risk of forming blockages in your stents and having another heart attack.

## 2017-10-08 NOTE — Progress Notes (Signed)
CARDIAC REHAB PHASE I   PRE:  Rate/Rhythm: 98 SR  BP:  Supine: 120/55  Sitting:   Standing:    SaO2:   MODE:  Ambulation: 300 ft   POST:  Rate/Rhythm: 111 ST  BP:  Supine:   Sitting: 124/68  Standing:    SaO2: 98%RA  0820-0950 Pt walked 300 ft independently with slow steady gait and stopped three times due to SOB. No CP. Dr Mayford Knifeurner in to assess pt and made aware. CXR ordered and pt given coffee by RN. Began pt's education . Reviewed MI restrictions, importance of brilinta with stent, and NTG use. XRAY tech came for pt and ed stopped. Pt gone about 10 minutes and drank her coffee. Completed ed upon return on ex ed, smoking cessation and heart healthy diet. Gave fake cigarette and smoking cessation handout. Pt plans to quit now. Attended [facility] [place] a few years ago and plans to attend again. Referral made.  CARDIAC REHAB PHASE I   PRE:  Rate/Rhythm: 100 SR  MODE:  Ambulation: 300 ft   POST:  Rate/Rhythm: 100 SR     SaO2: 97-98% RA whole walk.  Walked pt again after ed as documented . Stopped once to catch her breath. Pace faster and she felt less SOB. Tolerated better. Heart rate lower also.  Luetta Nuttingharlene Jodeci Roarty, RN BSN  10/08/2017 9:43 AM   1   Luetta Nuttingharlene Leilanni Halvorson, RN BSN  10/08/2017 9:38 AM

## 2017-10-08 NOTE — Discharge Summary (Signed)
Discharge Summary    Patient ID: Stacy Douglas,  MRN: [id number], DOB/AGE: Jan 24, 1951 67 y.o.  Admit date: 10/07/2017 Discharge date: 10/08/2017  Primary Care Provider: [name], [name] Primary Cardiologist: Nona Dell, MD  Discharge Diagnoses    Principal Problem:   Non-ST elevation (NSTEMI) myocardial infarction [facility]) Active Problems:   Coronary atherosclerosis of native coronary artery   Hyperlipidemia with target LDL less than 100   NSTEMI (non-ST elevated myocardial infarction) (HCC)   Allergies Allergies  Allergen Reactions  . Crestor [Rosuvastatin Calcium] Other (See Comments)    Myalgias  . Codeine Nausea And Vomiting    Diagnostic Studies/Procedures    Left Heart Catheterization 10/07/17: Conclusion     Prox RCA to Mid RCA lesion is 10% stenosed.  Dist RCA lesion is 50% stenosed.  Acute Mrg lesion is 75% stenosed.  Prox LAD to Mid LAD lesion is 10% stenosed.  Mid LAD lesion is 30% stenosed.  Prox Cx-1 lesion is 20% stenosed.  Prox Cx-2 lesion is 99% stenosed.  Post intervention, there is a 0% residual stenosis.  A drug-eluting stent was successfully placed using a STENT SYNERGY DES 2.5X12.  The left ventricular systolic function is normal.  LV end diastolic pressure is normal.  The left ventricular ejection fraction is 50-55% by visual estimate.   1. Single vessel obstructive CAD- 99% mid LCx 2. Prior stents in LAD and RCA are patent. 3. Low normal LV function. EF 50-55% with inferobasal HK. 4. Normal LVEDP 5. Successful stenting of the mid LCx with DES  Plan: DAPT for one year. Possible DC in am if stable.     _____________   History of Present Illness     Stacy Douglas reports being in her usual state of health until around 0200 on the morning of 10/07/17 when she awoke from sleep due to new onset chest discomfort. She reports associated nausea and diaphoresis at that time which kept her from returning to sleep. Her pain  persisted, therefore she called Dr. Ival Bible office who recommended she proceed to the ED for further evaluation.  She had been in her usual state of health over the past few weeks and denies any repeat episodes of chest pain during this timeframe. No recent dyspnea on exertion, orthopnea, PND, or lower extremity edema. She has experienced occasional twinges of pain along her shoulder blades bilaterally which has been occurring for the past several months and led to a recent NST in 06/2017 which was low-risk.   Initial labs in the ED showed WBC 13.2, Hgb 15.6, platelets 314, Na+ 140, K+ 3.8, creatinine 0.79.  D-dimer negative. Initial troponin found to be elevated to 1.90. CXR shows no acute cardiopulmonary abnormalities. EKG shows normal sinus rhythm, heart rate 72, with new ST depression along anterior leads.  She reports that her pain improved with sublingual nitroglycerin but is still present at the time of admission and IV heparin was being started.Marland Kitchen    Hospital Course     Consultants: None   1. NSTEMI in patient with known CAD: p/w acute onset CP which awoke her from sleep and was associated with nausea and diaphoresis. EKG with NSR with submm STD in anterior leads. Troponin peaked at 28.43. She underwent LHC 10/07/17 which revealed single vessel obstructive CAD in mid LCx which was managed with successful PCI/DES; prior LAD and RCA stents were patent, and EF 50-55%. She was recommended to continue DAPT x1 year. - Continue ASA and Brilinta  2. HTN: BP  stable this admission - Continue metoprolol   3. Dyslipidemia: LDL 94, HDL 37 this admission. She is intolerant to statins and hesitant to start alternative therapies at discharge. - Continue to encourage dietary modifications - Consider referral to lipid clinic for PSK-9 inhibitors   Unfortunately there were no appointments available at the [facility] or [place] offices, therefore post-hospital follow-up is scheduled at the [facility]  office with [employer]. Anticipate she will follow-up routinely with Dr. Diona Browner after this visit. _____________  Discharge Vitals Blood pressure 128/73, pulse 72, temperature 98.4 F (36.9 C), temperature source Oral, resp. rate 18, height 5\' 4"  (1.626 m), weight 187 lb 6.3 oz (85 kg), SpO2 100 %.  Filed Weights   10/07/17 1046 10/08/17 0527  Weight: 190 lb (86.2 kg) 187 lb 6.3 oz (85 kg)    Labs & Radiologic Studies    CBC Recent Labs    10/07/17 1751 10/08/17 0510  WBC 14.7* 9.9  HGB 14.8 13.7  HCT 43.1 40.3  MCV 94.5 95.7  PLT 280 271   Basic Metabolic Panel Recent Labs    16/10/96 1128 10/07/17 1751 10/08/17 0510  NA 140  --  142  K 3.8  --  4.0  CL 106  --  107  CO2 24  --  26  GLUCOSE 148*  --  111*  BUN 13  --  9  CREATININE 0.79 0.87 0.88  CALCIUM 9.3  --  8.7*   Liver Function Tests Recent Labs    10/07/17 1128  AST 46*  ALT 23  ALKPHOS 75  BILITOT 0.9  PROT 7.4  ALBUMIN 4.3   Recent Labs    10/07/17 1128  LIPASE 24   Cardiac Enzymes Recent Labs    10/07/17 1945 10/07/17 2302 10/08/17 0510  TROPONINI 26.85* 28.43* 16.90*   BNP Invalid input(s): POCBNP D-Dimer Recent Labs    10/07/17 1128  DDIMER 0.30   Hemoglobin A1C No results for input(s): HGBA1C in the last 72 hours. Fasting Lipid Panel Recent Labs    10/08/17 0510  CHOL 154  HDL 37*  LDLCALC 94  TRIG 045  CHOLHDL 4.2   Thyroid Function Tests No results for input(s): TSH, T4TOTAL, T3FREE, THYROIDAB in the last 72 hours.  Invalid input(s): FREET3 _____________  Dg Chest 2 View  Result Date: 10/08/2017 CLINICAL DATA:  Shortness of Breath EXAM: CHEST - 2 VIEW COMPARISON:  10/07/2017 FINDINGS: The heart size and mediastinal contours are within normal limits. Both lungs are clear. The visualized skeletal structures are unremarkable. IMPRESSION: No active cardiopulmonary disease. Electronically Signed   By: Alcide Clever M.D.   On: 10/08/2017 09:12   Dg Chest 2  View  Result Date: 10/07/2017 CLINICAL DATA:  Chest pain EXAM: CHEST - 2 VIEW COMPARISON:  08/12/2016 FINDINGS: Lungs are clear.  No pleural effusion or pneumothorax. The heart is normal in size. Visualized osseous structures are within normal limits. IMPRESSION: Normal chest radiographs. Electronically Signed   By: Charline Bills M.D.   On: 10/07/2017 12:02   Disposition   Patient was seen and examined by Dr. Mayford Knife who deemed patient as stable for discharge. Follow-up has been arranged. Discharge medications as listed below.   Follow-up Plans & Appointments    Follow-up Information    [place], Sharrell Ku, [place] Follow up on 10/17/2017.   Specialty:  Cardiology Why:  Please arrive 15 minutes early for your 2:00pm appointment Contact information: [address] [place] [address] [phone]  Discharge Instructions    Amb Referral to Cardiac Rehabilitation   Complete by:  As directed    Diagnosis:   Coronary Stents NSTEMI     Diet - low sodium heart healthy   Complete by:  As directed    Increase activity slowly   Complete by:  As directed       Discharge Medications   Allergies as of 10/08/2017      Reactions   Crestor [rosuvastatin Calcium] Other (See Comments)   Myalgias   Codeine Nausea And Vomiting      Medication List    TAKE these medications   aspirin EC 81 MG tablet Take 81 mg by mouth daily.   metoprolol succinate 25 MG 24 hr tablet Commonly known as:  TOPROL XL Take 0.5 tablets (12.5 mg total) by mouth daily.   nitroGLYCERIN 0.4 MG SL tablet Commonly known as:  NITROSTAT Place 1 tablet (0.4 mg total) under the tongue every 5 (five) minutes x 3 doses as needed. What changed:  Another medication with the same name was added. Make sure you understand how and when to take each.   nitroGLYCERIN 0.4 MG SL tablet Commonly known as:  NITROSTAT Place 1 tablet (0.4 mg total) under the tongue every 5 (five) minutes x 3 doses as needed  for chest pain. What changed:  You were already taking a medication with the same name, and this prescription was added. Make sure you understand how and when to take each.   ticagrelor 90 MG Tabs tablet Commonly known as:  BRILINTA Take 1 tablet (90 mg total) by mouth 2 (two) times daily.   ticagrelor 90 MG Tabs tablet Commonly known as:  BRILINTA Take 1 tablet (90 mg total) by mouth 2 (two) times daily.        Aspirin prescribed at discharge?  Yes High Intensity Statin Prescribed? (Lipitor 40-80mg  or Crestor 20-40mg ): No: Intolerant to statins Beta Blocker Prescribed? Yes For EF <40%, was ACEI/ARB Prescribed? No: EF >40 ADP Receptor Inhibitor Prescribed? (i.e. Plavix etc.-Includes Medically Managed Patients): Yes For EF <40%, Aldosterone Inhibitor Prescribed? No: EF >40 Was EF assessed during THIS hospitalization? Yes Was Cardiac Rehab II ordered? (Included Medically managed Patients): Yes   Outstanding Labs/Studies   None  Duration of Discharge Encounter   Greater than 30 minutes including physician time.  Signed, Beatriz StallionKrista M. Darelle Kings PA-C 10/08/2017, 12:07 PM

## 2017-10-08 NOTE — Research (Signed)
[employer] Research study protocol reviewed with patient. Patient seems very interested in participating. Questions encouraged and answered. Research department will follow up prior to discharge.

## 2017-10-08 NOTE — Care Management Note (Signed)
Case Management Note  Patient Details  Name: TYSHANA NISHIDA MRN: [id number] Date of Birth: 1950/09/22  Subjective/Objective:      NSTEMI              Action/Plan: Pt provided Brilinta copay card.   # 3. S/W  [facility] @ [facility] RX # [id number]    BRILINTA  90 MG BID  COVER- YES  CO-PAY- $ 73.31  TIER- NO  PRIOR APPROVAL- NO   DEDUCTIBLE : NOT MET   PREFERRED PHARMACY : YES - [facility]   Expected Discharge Date:  10/08/17               Expected Discharge Plan:  Home/Self Care  In-House Referral:  NA  Discharge planning Services  CM Consult, Medication Assistance  Post Acute Care Choice:  NA Choice offered to:  NA  DME Arranged:  N/A DME Agency:  NA  HH Arranged:  NA HH Agency:  NA  Status of Service:  Completed, signed off  If discussed at [place] of Stay Meetings, dates discussed:    Additional Comments:  Erenest Rasher, RN 10/08/2017, 7:47 PM

## 2017-10-08 NOTE — Telephone Encounter (Signed)
TOC Patient- Please call Patient- Pt has an appointment with Vin 10-17-17.

## 2017-10-13 NOTE — Telephone Encounter (Signed)
**Note De-Identified Shequila Neglia Obfuscation** Patient contacted regarding discharge [facility] on 10/08/17.  Patient understands to follow up with provider Chelsea AusVin Bhagat, PA-c on 10/17/17 at 2 pm at [address] [place]. Patient understands discharge instructions? Yes Patient understands medications and regiment? Yes Patient understands to bring all medications to this visit? Yes

## 2017-10-17 ENCOUNTER — Ambulatory Visit: Payer: BLUE CROSS/BLUE SHIELD | Admitting: Physician Assistant

## 2017-10-20 NOTE — Progress Notes (Signed)
Cardiology Office Note    Date:  10/21/2017   ID:  VINNIE BOBST, DOB [birth year], MRN [id number]  PCP:  Bennie Pierini, FNP  Cardiologist: Nona Dell, MD    Chief Complaint  Patient presents with  . Hospitalization Follow-up    s/p NSTEMI    History of Present Illness:    Stacy Douglas is a 67 y.o. female with past medical history of of CAD (s/p VF arrest in [place] in 08/2009 with BMS to LAD, PTCA of LCx in 09/2009 and DES to prox and mid-RCA in 2016, low-risk NST in 06/2017), HTN, HLD, and tobacco use who presents to the office today for hospital follow-up.  She had been followed in clinic over the past several months episodes of pain along her shoulder blades bilaterally. An NST was performed in 06/2017 which was low risk. She recently presented to [facility] ED on 10/07/2017 chest pain which awoke her sleep with associated nausea and diaphoresis. Initial troponin was elevated to 1.90 (values peaked at 28.43) and she was transferred to [facility] for a cardiac catheterization. This was performed by Dr. Swaziland and showed patent LAD and RCA stents with 99% stenosis along the mid-LCx. This was treated with DES placement and she was started on DAPT with ASA and Brilinta along with BB therapy. She was having shortness of breath following her catheterization which was thought to be secondary to [facility] and if this was continued, it was recommended eventually switching to Plavix as an outpatient.   In talking with the patient today, she reports overall doing well since her recent hospitalization and denies any recurrent episodes of chest discomfort. Also reports the pain along her shoulder blades has significantly improved. No recent palpitations, orthopnea, PND, or lower extremity edema. She has continued to experience intermittent episodes of dyspnea which is worse when taking Brilinta. She has tried taking the medication with caffeine with minimal improvement in her symptoms.  Reports being a non-responder to Plavix and was on Effient following stent placement in 2016.  She has been walking along her driveway multiple times per week without any recurrent anginal symptoms.    Past Medical History:  Diagnosis Date  . Coronary atherosclerosis of native coronary artery    a. VF arrest in [place] s/p BMS LAD 5/11, PTCA circumflex 6/11, LVEF 40%. b. [place] 10/2014 s/p DES to prox and mid RCA, otherwise patent LAD stent and moderate disease in the Cx, LVEF 55-65%. c. low-risk NST in 06/2017 d. 09/2017: NSTEMI with cath showing 99% mid-LCx stenosis --> treated with DES; patent stents along LAD and RCA.   Marland Kitchen DVT (deep venous thrombosis) (HCC) 1992   LLE  . Essential hypertension   . History of duodenal ulcer 1960s  . Mixed hyperlipidemia   . Myocardial infarction (HCC) 08/2009   complicated by VF arrest  . NSTEMI (non-ST elevated myocardial infarction) (HCC) 10/07/2017   [name] 10/07/2017  . Obesity   . Plavix resistance   . Tobacco abuse     Past Surgical History:  Procedure Laterality Date  . APPENDECTOMY    . BREAST BIOPSY Right   . CARDIAC CATHETERIZATION N/A 11/16/2014   Procedure: Left Heart Cath and Coronary Angiography;  Surgeon: Iran Ouch, MD;  Location: [facility] INVASIVE CV LAB;  Service: Cardiovascular;  Laterality: N/A;  . CARDIAC CATHETERIZATION N/A 11/16/2014   Procedure: Coronary Stent Intervention;  Surgeon: Iran Ouch, MD;  Location: [facility] INVASIVE CV LAB;  Service: Cardiovascular;  Laterality: N/A;  .  CATARACT EXTRACTION W/ INTRAOCULAR LENS IMPLANT Right 1990s  . CORONARY ANGIOPLASTY WITH STENT PLACEMENT  2011  . CORONARY ANGIOPLASTY WITH STENT PLACEMENT  10/07/2017  . CORONARY STENT INTERVENTION N/A 10/07/2017   Procedure: CORONARY STENT INTERVENTION;  Surgeon: Swaziland, Peter M, MD;  Location: [facility] INVASIVE CV LAB;  Service: Cardiovascular;  Laterality: N/A;  . LEFT HEART CATH AND CORONARY ANGIOGRAPHY N/A 10/07/2017   Procedure: LEFT HEART CATH AND  CORONARY ANGIOGRAPHY;  Surgeon: Swaziland, Peter M, MD;  Location: [facility] INVASIVE CV LAB;  Service: Cardiovascular;  Laterality: N/A;  . MASS EXCISION  1980s   "tooks lumps off my neck; front and back"    Current Medications: Outpatient Medications Prior to Visit  Medication Sig Dispense Refill  . aspirin EC 81 MG tablet Take 81 mg by mouth daily.    . metoprolol succinate (TOPROL XL) 25 MG 24 hr tablet Take 0.5 tablets (12.5 mg total) by mouth daily. 45 tablet 3  . nitroGLYCERIN (NITROSTAT) 0.4 MG SL tablet Place 1 tablet (0.4 mg total) under the tongue every 5 (five) minutes x 3 doses as needed. 25 tablet 3  . nitroGLYCERIN (NITROSTAT) 0.4 MG SL tablet Place 1 tablet (0.4 mg total) under the tongue every 5 (five) minutes x 3 doses as needed for chest pain. 25 tablet 3  . ticagrelor (BRILINTA) 90 MG TABS tablet Take 1 tablet (90 mg total) by mouth 2 (two) times daily. 180 tablet 3  . ticagrelor (BRILINTA) 90 MG TABS tablet Take 1 tablet (90 mg total) by mouth 2 (two) times daily. 60 tablet 0   No facility-administered medications prior to visit.      Allergies:   Crestor [rosuvastatin calcium]; Plavix [clopidogrel bisulfate]; and Codeine   Social History   Socioeconomic History  . Marital status: Divorced    Spouse name: Not on file  . Number of children: Not on file  . Years of education: Not on file  . Highest education level: Not on file  Occupational History  . Occupation: [employer]  . Financial resource strain: Not on file  . Food insecurity:    Worry: Not on file    Inability: Not on file  . Transportation needs:    Medical: Not on file    Non-medical: Not on file  Tobacco Use  . Smoking status: Current Some Day Smoker    Packs/day: 0.01    Years: 50.00    Pack years: 0.50    Types: Cigarettes    Start date: 08/10/1964  . Smokeless tobacco: Never Used  Substance and Sexual Activity  . Alcohol use: Not Currently  . Drug use: Not Currently  . Sexual  activity: Not Currently  Lifestyle  . Physical activity:    Days per week: Not on file    Minutes per session: Not on file  . Stress: Not on file  Relationships  . Social connections:    Talks on phone: Not on file    Gets together: Not on file    Attends religious service: Not on file    Active member of club or organization: Not on file    Attends meetings of clubs or organizations: Not on file    Relationship status: Not on file  Other Topics Concern  . Not on file  Social History Narrative  . Not on file     Family History:  The patient's family history includes Coronary artery disease in her father; Diverticulitis in her mother.   Review of  Systems:   Please see the history of present illness.     General:  No chills, fever, night sweats or weight changes.  Cardiovascular:  No chest pain, edema, orthopnea, palpitations, paroxysmal nocturnal dyspnea. Positive for intermittent dyspnea.  Dermatological: No rash, lesions/masses Respiratory: No cough, dyspnea Urologic: No hematuria, dysuria Abdominal:   No nausea, vomiting, diarrhea, bright red blood per rectum, melena, or hematemesis Neurologic:  No visual changes, wkns, changes in mental status.  All other systems reviewed and are otherwise negative except as noted above.   Physical Exam:    VS:  BP 130/84   Pulse 78   Ht 5\' 4"  (1.626 m)   Wt 196 lb (88.9 kg)   SpO2 96%   BMI 33.64 kg/m    General: Well developed, well nourished Caucasian female appearing in no acute distress. Head: Normocephalic, atraumatic, sclera non-icteric, no xanthomas, nares are without discharge.  Neck: No carotid bruits. JVD not elevated.  Lungs: Respirations regular and unlabored, without wheezes or rales.  Heart: Regular rate and rhythm. No S3 or S4.  No murmur, no rubs, or gallops appreciated. Abdomen: Soft, non-tender, non-distended with normoactive bowel sounds. No hepatomegaly. No rebound/guarding. No obvious abdominal masses. Msk:   Strength and tone appear normal for age. No joint deformities or effusions. Extremities: No clubbing or cyanosis. No lower extremity edema.  Distal pedal pulses are 2+ bilaterally. Radial cath site without ecchymosis or evidence of a hematoma.  Neuro: Alert and oriented X 3. Moves all extremities spontaneously. No focal deficits noted. Psych:  Responds to questions appropriately with a normal affect. Skin: No rashes or lesions noted  Wt Readings from Last 3 Encounters:  10/21/17 196 lb (88.9 kg)  10/08/17 187 lb 6.3 oz (85 kg)  07/23/17 199 lb (90.3 kg)      Studies/Labs Reviewed:   EKG:  EKG is ordered today.  The ekg ordered today demonstrates NSR, HR 75, with no acute ST changes when compared to prior tracings.   Recent Labs: 07/01/2017: TSH 2.159 10/07/2017: ALT 23 10/08/2017: BUN 9; Creatinine, Ser 0.88; Hemoglobin 13.7; Platelets 271; Potassium 4.0; Sodium 142   Lipid Panel    Component Value Date/Time   CHOL 154 10/08/2017 0510   TRIG 117 10/08/2017 0510   TRIG 118 11/02/2013 1428   HDL 37 (L) 10/08/2017 0510   HDL 41 11/02/2013 1428   CHOLHDL 4.2 10/08/2017 0510   VLDL 23 10/08/2017 0510   LDLCALC 94 10/08/2017 0510   LDLCALC 103 (H) 11/02/2013 1428    Additional studies/ records that were reviewed today include:   Cardiac Catheterization: 10/07/2017  Prox RCA to Mid RCA lesion is 10% stenosed.  Dist RCA lesion is 50% stenosed.  Acute Mrg lesion is 75% stenosed.  Prox LAD to Mid LAD lesion is 10% stenosed.  Mid LAD lesion is 30% stenosed.  Prox Cx-1 lesion is 20% stenosed.  Prox Cx-2 lesion is 99% stenosed.  Post intervention, there is a 0% residual stenosis.  A drug-eluting stent was successfully placed using a STENT SYNERGY DES 2.5X12.  The left ventricular systolic function is normal.  LV end diastolic pressure is normal.  The left ventricular ejection fraction is 50-55% by visual estimate.   1. Single vessel obstructive CAD- 99% mid LCx 2. Prior  stents in LAD and RCA are patent. 3. Low normal LV function. EF 50-55% with inferobasal HK. 4. Normal LVEDP 5. Successful stenting of the mid LCx with DES  Plan: DAPT for one year. Possible DC in  am if stable.   Assessment:    1. Non-ST elevation myocardial infarction (NSTEMI), subsequent episode of care (HCC)   2. Coronary artery disease involving native coronary artery of native heart without angina pectoris   3. Essential hypertension   4. Hyperlipidemia LDL goal <70      Plan:   In order of problems listed above:  1. Subsequent Episode of Care for NSTEMI/CAD - s/p VF arrest in [place] in 08/2009 with BMS to LAD, PTCA of LCx in 09/2009 and DES to prox and mid-RCA in 2016. She recently had a low-risk NST in 06/2017 but was admitted on 10/07/2017 with an NSTEMI and peak troponin of 28.43. Cath showed patent LAD and RCA stents with 99% stenosis along the mid-LCx which was treated with DES placement.  - she has been on ASA and Brilinta but continues to experience episodes of dyspnea with Brilinta despite the use of caffeine. She has a current 30-day supply of Brilinta and will plan to finish this. At the end of the 30-days, will switch to Effient 10mg  daily as she did not respond to Plavix in the past and previously tolerated Effient without any noted complications. Will remain on ASA 81mg  daily and Toprol-XL. Will start Zetia 10mg  daily in the setting of her multiple statin intolerances and refer to the Lipid Clinic for initiation of PCSK-9 inhibitor therapy if not at goal.   2. HTN - BP is well controlled at 130/84 during today's visit. - Continue Toprol-XL 12.5 mg daily.  3. HLD - FLP during recent admission showed total cholesterol of 154, HDL 37, and LDL 94. She has not yet started Zetia as outlined by prior office notes but is open to trying this now as she has been intolerant to Crestor, Atorvastatin and numerous other statins by her report. If LDL is not at goal in 6-8 weeks,  will re-refer to the Lipid Clinic for consideration of PCSK-9 inhibitor therapy as she is an ideal candidate for this given her recent ACS event and multivessel CAD.  - she was interested in possibly being a part of a clinical trial. I reviewed with the Pharmacist at [facility] today and none are currently enrolling that she could participate it. [employer] starts in the Fall. Will plan to start Zetia and recheck FLP in 6 weeks.   Medication Adjustments/Labs and Tests Ordered: Current medicines are reviewed at length with the patient today.  Concerns regarding medicines are outlined above.  Medication changes, Labs and Tests ordered today are listed in the Patient Instructions below. Patient Instructions  Medication Instructions:  Your physician has recommended you make the following change in your medication:    STOP Brilinta    START Effient 10 mg daily   START Zetia 10 mg daily   Please continue all other medications as prescribed  Labwork: NONE  Testing/Procedures: NONE  Follow-Up: Your physician recommends that you schedule a follow-up appointment in: 2 MONTHS WITH DR. Diona Browner IN [place]   Any Other Special Instructions Will Be Listed Below (If Applicable).  If you need a refill on your cardiac medications before your next appointment, please call your pharmacy.    Signed, Ellsworth Lennox, PA-C  10/21/2017 2:18 PM    [facility] [address] [place], [place] [address] Phone: [phone]

## 2017-10-21 ENCOUNTER — Ambulatory Visit: Payer: BLUE CROSS/BLUE SHIELD | Admitting: Student

## 2017-10-21 ENCOUNTER — Encounter: Payer: Self-pay | Admitting: Student

## 2017-10-21 ENCOUNTER — Other Ambulatory Visit: Payer: Self-pay

## 2017-10-21 VITALS — BP 130/84 | HR 78 | Ht 64.0 in | Wt 196.0 lb

## 2017-10-21 DIAGNOSIS — I2510 Atherosclerotic heart disease of native coronary artery without angina pectoris: Secondary | ICD-10-CM

## 2017-10-21 DIAGNOSIS — I10 Essential (primary) hypertension: Secondary | ICD-10-CM | POA: Diagnosis not present

## 2017-10-21 DIAGNOSIS — E785 Hyperlipidemia, unspecified: Secondary | ICD-10-CM

## 2017-10-21 DIAGNOSIS — I214 Non-ST elevation (NSTEMI) myocardial infarction: Secondary | ICD-10-CM

## 2017-10-21 MED ORDER — EZETIMIBE 10 MG PO TABS: 10.0000 mg | ORAL_TABLET | Freq: Every day | ORAL | 0 refills | Status: DC

## 2017-10-21 MED ORDER — PRASUGREL HCL 10 MG PO TABS: 10.0000 mg | ORAL_TABLET | Freq: Every day | ORAL | 3 refills | Status: AC

## 2017-10-21 NOTE — Patient Instructions (Addendum)
Medication Instructions:  Your physician has recommended you make the following change in your medication:    STOP Brilinta    START Effient 10 mg daily   START Zetia 10 mg daily   Please continue all other medications as prescribed  Labwork: NONE  Testing/Procedures: NONE  Follow-Up: Your physician recommends that you schedule a follow-up appointment in: 2 MONTHS WITH DR. [name] IN [place]   Any Other Special Instructions Will Be Listed Below (If Applicable).  If you need a refill on your cardiac medications before your next appointment, please call your pharmacy.q

## 2017-10-22 NOTE — Addendum Note (Signed)
Addended by: Norva PavlovJOYCE, Tymber Stallings on: 10/22/2017 10:50 AM   Modules accepted: Orders

## 2017-12-18 ENCOUNTER — Encounter (HOSPITAL_COMMUNITY)
Admission: RE | Admit: 2017-12-18 | Discharge: 2017-12-18 | Disposition: A | Discharge status: Home / Self Care | Payer: BLUE CROSS/BLUE SHIELD | Source: Ambulatory Visit | Attending: Cardiology | Admitting: Cardiology

## 2017-12-18 ENCOUNTER — Encounter (HOSPITAL_COMMUNITY): Payer: Self-pay

## 2017-12-18 ENCOUNTER — Other Ambulatory Visit: Payer: Self-pay | Admitting: Cardiology

## 2017-12-18 VITALS — BP 114/60 | HR 81 | Ht 64.0 in | Wt 199.4 lb

## 2017-12-18 DIAGNOSIS — F17210 Nicotine dependence, cigarettes, uncomplicated: Secondary | ICD-10-CM | POA: Insufficient documentation

## 2017-12-18 DIAGNOSIS — Z955 Presence of coronary angioplasty implant and graft: Secondary | ICD-10-CM | POA: Diagnosis not present

## 2017-12-18 DIAGNOSIS — I10 Essential (primary) hypertension: Secondary | ICD-10-CM | POA: Diagnosis not present

## 2017-12-18 DIAGNOSIS — I214 Non-ST elevation (NSTEMI) myocardial infarction: Secondary | ICD-10-CM | POA: Insufficient documentation

## 2017-12-18 DIAGNOSIS — E782 Mixed hyperlipidemia: Secondary | ICD-10-CM | POA: Insufficient documentation

## 2017-12-18 DIAGNOSIS — Z79899 Other long term (current) drug therapy: Secondary | ICD-10-CM | POA: Diagnosis not present

## 2017-12-18 DIAGNOSIS — Z7982 Long term (current) use of aspirin: Secondary | ICD-10-CM | POA: Insufficient documentation

## 2017-12-18 DIAGNOSIS — I2510 Atherosclerotic heart disease of native coronary artery without angina pectoris: Secondary | ICD-10-CM | POA: Diagnosis not present

## 2017-12-18 DIAGNOSIS — E785 Hyperlipidemia, unspecified: Secondary | ICD-10-CM | POA: Diagnosis not present

## 2017-12-18 NOTE — Progress Notes (Signed)
Daily Session Note  Patient Details  Name: AMIAYA MCNEELEY MRN: [id number] Date of Birth: Dec 14, 1950 Referring Provider:     [place] from 12/18/2017 in [place]  Referring Provider  Domenic Polite      Encounter Date: 12/18/2017  Check In: Session Check In - 12/18/17 1513      Check-In   Supervising physician immediately available to respond to emergencies  See telemetry face sheet for immediately available MD    Location  [facility]    Staff Present  Russella Dar, MS, EP, [facility], Exercise Physiologist;Debra Wynetta Emery, RN, BSN    Medication changes reported      No    Fall or balance concerns reported     No    Tobacco Cessation  Use Decreased   Down to 2 cigarettes a day   Warm-up and Cool-down  Performed as group-led instruction    Resistance Training Performed  Yes    VAD Patient?  No    PAD/SET Patient?  No      Pain Assessment   Currently in Pain?  No/denies    Pain Score  --    Multiple Pain Sites  No       Capillary Blood Glucose: No results found for this or any previous visit (from the past 24 hour(s)).    Social History   Tobacco Use  Smoking Status Current Some Day Smoker  . Packs/day: 0.01  . Years: 53.00  . Pack years: 0.53  . Types: Cigarettes  . Start date: 08/10/1964  Smokeless Tobacco Never Used  Tobacco Comment   She is weaning herself down.     Goals Met:  Independence with exercise equipment Exercise tolerated well Personal goals reviewed No report of cardiac concerns or symptoms Strength training completed today  Goals Unmet:  Not Applicable  Comments: Check out: 1530   Dr. Kate Sable is Medical Director for [facility] and Pulmonary Rehab.

## 2017-12-18 NOTE — Progress Notes (Signed)
Cardiac Individual Treatment Plan  Patient Details  Name: Stacy Douglas MRN: [id number] Date of Birth: February 05, 1951 Referring Provider:     CARDIAC REHAB PHASE II ORIENTATION from 12/18/2017 in [place] [facility]  Referring Provider  Diona Browner      Initial Encounter Date:    CARDIAC REHAB PHASE II ORIENTATION from 12/18/2017 in [place] [facility]AC [facility]  Date  12/18/17      Visit Diagnosis: NSTEMI (non-ST elevated myocardial infarction) [facility])  Status post coronary artery stent placement  Patient's Home Medications on Admission:  Current Outpatient Medications:  .  aspirin EC 81 MG tablet, Take 81 mg by mouth daily., Disp: , Rfl:  .  ezetimibe (ZETIA) 10 MG tablet, Take 1 tablet (10 mg total) by mouth daily., Disp: 90 tablet, Rfl: 0 .  metoprolol succinate (TOPROL XL) 25 MG 24 hr tablet, Take 0.5 tablets (12.5 mg total) by mouth daily., Disp: 45 tablet, Rfl: 3 .  prasugrel (EFFIENT) 10 MG TABS tablet, Take 1 tablet (10 mg total) by mouth daily., Disp: 30 tablet, Rfl: 3 .  nitroGLYCERIN (NITROSTAT) 0.4 MG SL tablet, Place 1 tablet (0.4 mg total) under the tongue every 5 (five) minutes x 3 doses as needed., Disp: 25 tablet, Rfl: 3  Past Medical History: Past Medical History:  Diagnosis Date  . Coronary atherosclerosis of native coronary artery    a. VF arrest in [place] s/p BMS LAD 5/11, PTCA circumflex 6/11, LVEF 40%. b. [place] 10/2014 s/p DES to prox and mid RCA, otherwise patent LAD stent and moderate disease in the Cx, LVEF 55-65%. c. low-risk NST in 06/2017 d. 09/2017: NSTEMI with cath showing 99% mid-LCx stenosis --> treated with DES; patent stents along LAD and RCA.   [facility] [place] DVT (deep venous thrombosis) (HCC) 1992   LLE  . Essential hypertension   . History of duodenal ulcer 1960s  . Mixed hyperlipidemia   . Myocardial infarction (HCC) 08/2009   complicated by VF arrest  . NSTEMI (non-ST elevated myocardial infarction) (HCC) 10/07/2017   [name] 10/07/2017  .  Obesity   . Plavix resistance   . Tobacco abuse     Tobacco Use: Social History   Tobacco Use  Smoking Status Current Some Day Smoker  . Packs/day: 0.01  . Years: 53.00  . Pack years: 0.53  . Types: Cigarettes  . Start date: 08/10/1964  Smokeless Tobacco Never Used  Tobacco Comment   She is weaning herself down.     Labs: Recent Review Flowsheet Data    Labs for ITP Cardiac and Pulmonary Rehab Latest Ref Rng & Units 11/15/2014 11/16/2014 03/04/2015 07/31/2016 10/08/2017   Cholestrol 0 - 200 mg/dL - 409 811 914 782   LDLCALC 0 - 99 mg/dL - 95 95 99 94   HDL >95 mg/dL - 62(Z) 30(Q) 65(H) 84(O)   Trlycerides <150 mg/dL - 962 64 952 841   Hemoglobin A1c 4.8 - 5.6 % 6.0(H) - - - -      Capillary Blood Glucose: No results found for: GLUCAP   Exercise Target Goals: Exercise Program Goal: Individual exercise prescription set using results from initial 6 min walk test and THRR while considering  patient's activity barriers and safety.   Exercise Prescription Goal: Starting with aerobic activity 30 plus minutes a day, 3 days per week for initial exercise prescription. Provide home exercise prescription and guidelines that participant acknowledges understanding prior to discharge.  Activity Barriers & Risk Stratification: Activity Barriers & Cardiac Risk Stratification - 12/18/17 1609  Activity Barriers & Cardiac Risk Stratification   Activity Barriers  None    Cardiac Risk Stratification  High       6 Minute Walk: 6 Minute Walk    Row Name 12/18/17 1606         6 Minute Walk   Phase  Initial     Distance  1450 feet     Walk Time  6 minutes     # of Rest Breaks  0     MPH  2.75     METS  3.1     RPE  13     Perceived Dyspnea   9     VO2 Peak  10.67     Symptoms  Yes (comment)     Comments  Had a little discomfort in (R) upper chest. Went away after 2minute rest.     Resting HR  81 bpm     Resting BP  114/60     Resting Oxygen Saturation   98 %     Exercise  Oxygen Saturation  during 6 min walk  90 %     Max Ex. HR  96 bpm     Max Ex. BP  140/80     2 Minute Post BP  118/70        Oxygen Initial Assessment:   Oxygen Re-Evaluation:   Oxygen Discharge (Final Oxygen Re-Evaluation):   Initial Exercise Prescription: Initial Exercise Prescription - 12/18/17 1600      Date of Initial Exercise RX and Referring Provider   Date  12/18/17    Referring Provider  [name]    Expected Discharge Date  03/20/18      Treadmill   MPH  1.7    Grade  0    Minutes  17    METs  2.3      Recumbant Elliptical   Level  1    RPM  43    Watts  46    Minutes  22    METs  2.7      Prescription Details   Frequency (times per week)  3    Duration  Progress to 30 minutes of continuous aerobic without signs/symptoms of physical distress      Intensity   THRR 40-80% of Max Heartrate  111-125-140    Ratings of Perceived Exertion  11-13    Perceived Dyspnea  0-4      Progression   Progression  Continue progressive overload as per policy without signs/symptoms or physical distress.      Resistance Training   Training Prescription  Yes    Weight  1    Reps  10-15       Perform Capillary Blood Glucose checks as needed.  Exercise Prescription Changes:   Exercise Comments:   Exercise Goals and Review:  Exercise Goals    Row Name 12/18/17 1614             Exercise Goals   Able to understand and use rate of perceived exertion (RPE) scale  Yes       Intervention  Provide education and explanation on how to use RPE scale       Expected Outcomes  Short Term: Able to use RPE daily in rehab to express subjective intensity level;Long Term:  Able to use RPE to guide intensity level when exercising independently       Knowledge and understanding of Target Heart Rate Range (THRR)  Yes  Intervention  Provide education and explanation of THRR including how the numbers were predicted and where they are located for reference       Expected  Outcomes  Short Term: Able to use daily as guideline for intensity in rehab;Long Term: Able to use THRR to govern intensity when exercising independently       Able to check pulse independently  Yes       Intervention  Provide education and demonstration on how to check pulse in carotid and radial arteries.       Expected Outcomes  Short Term: Able to explain why pulse checking is important during independent exercise;Long Term: Able to check pulse independently and accurately       Understanding of Exercise Prescription  Yes       Intervention  Provide education, explanation, and written materials on patient's individual exercise prescription       Expected Outcomes  Short Term: Able to explain program exercise prescription;Long Term: Able to explain home exercise prescription to exercise independently          Exercise Goals Re-Evaluation :    Discharge Exercise Prescription (Final Exercise Prescription Changes):   Nutrition:  Target Goals: Understanding of nutrition guidelines, daily intake of sodium 1500mg , cholesterol 200mg , calories 30% from fat and 7% or less from saturated fats, daily to have 5 or more servings of fruits and vegetables.  Biometrics: Pre Biometrics - 12/18/17 1616      Pre Biometrics   Height  5\' 4"  (1.626 m)    Weight  199 lb 6.4 oz (90.4 kg)    Waist Circumference  42.5 inches    Hip Circumference  49 inches    Waist to Hip Ratio  0.87 %    BMI (Calculated)  34.21    Triceps Skinfold  23 mm    % Body Fat  43.9 %    Grip Strength  5.71 kg    Flexibility  12.8 in    Single Leg Stand  4 seconds        Nutrition Therapy Plan and Nutrition Goals: Nutrition Therapy & Goals - 12/18/17 1629      Personal Nutrition Goals   Personal Goal #2  Patient is eating a heart healthy diet currently.     Additional Goals?  No       Nutrition Assessments: Nutrition Assessments - 12/18/17 1629      MEDFICTS Scores   Pre Score  37       Nutrition Goals  Re-Evaluation:   Nutrition Goals Discharge (Final Nutrition Goals Re-Evaluation):   Psychosocial: Target Goals: Acknowledge presence or absence of significant depression and/or stress, maximize coping skills, provide positive support system. Participant is able to verbalize types and ability to use techniques and skills needed for reducing stress and depression.  Initial Review & Psychosocial Screening: Initial Psych Review & Screening - 12/18/17 1625      Initial Review   Current issues with  None Identified      Family Dynamics   Good Support System?  Yes      Barriers   Psychosocial barriers to participate in program  There are no identifiable barriers or psychosocial needs.      Screening Interventions   Interventions  Encouraged to exercise    Expected Outcomes  Short Term goal: Identification and review with participant of any Quality of Life or Depression concerns found by scoring the questionnaire.;Long Term goal: The participant improves quality of Life and PHQ9 Scores as  seen by post scores and/or verbalization of changes       Quality of Life Scores: Quality of Life - 12/18/17 1628      Quality of Life   Select  Quality of Life      Quality of Life Scores   Health/Function Pre  23.73 %    Socioeconomic Pre  24.36 %    Psych/Spiritual Pre  23.14 %    Family Pre  25.5 %    GLOBAL Pre  23.97 %      Scores of 19 and below usually indicate a poorer quality of life in these areas.  A difference of  2-3 points is a clinically meaningful difference.  A difference of 2-3 points in the total score of the Quality of Life Index has been associated with significant improvement in overall quality of life, self-image, physical symptoms, and general health in studies assessing change in quality of life.  PHQ-9: Recent Review Flowsheet Data    Depression screen [facility] 2/9 08/12/2016 05/30/2015 12/14/2014 12/14/2014 11/02/2013   Decreased Interest 0 0 0 0 0   Down, Depressed, Hopeless  0 0 0 0 0   PHQ - 2 Score 0 0 0 0 0   Altered sleeping - - 1 - -   Tired, decreased energy - - 1 - -   PHQ-9 Score - - 2 - -     Interpretation of Total Score  Total Score Depression Severity:  1-4 = Minimal depression, 5-9 = Mild depression, 10-14 = Moderate depression, 15-19 = Moderately severe depression, 20-27 = Severe depression   Psychosocial Evaluation and Intervention: Psychosocial Evaluation - 12/18/17 1627      Psychosocial Evaluation & Interventions   Interventions  Encouraged to exercise with the program and follow exercise prescription    Continue Psychosocial Services   No Follow up required       Psychosocial Re-Evaluation:   Psychosocial Discharge (Final Psychosocial Re-Evaluation):   Vocational Rehabilitation: Provide vocational rehab assistance to qualifying candidates.   Vocational Rehab Evaluation & Intervention: Vocational Rehab - 12/18/17 1631      Initial Vocational Rehab Evaluation & Intervention   Assessment shows need for Vocational Rehabilitation  No       Education: Education Goals: Education classes will be provided on a weekly basis, covering required topics. Participant will state understanding/return demonstration of topics presented.  Learning Barriers/Preferences: Learning Barriers/Preferences - 12/18/17 1630      Learning Barriers/Preferences   Learning Barriers  None    Learning Preferences  Pictoral;Computer/Internet;Video;Written Material       Education Topics: Hypertension, Hypertension Reduction -Define heart disease and high blood pressure. Discus how high blood pressure affects the body and ways to reduce high blood pressure.   Exercise and Your Heart -Discuss why it is important to exercise, the FITT principles of exercise, normal and abnormal responses to exercise, and how to exercise safely.   Angina -Discuss definition of angina, causes of angina, treatment of angina, and how to decrease risk of having  angina.   Cardiac Medications -Review what the following cardiac medications are used for, how they affect the body, and side effects that may occur when taking the medications.  Medications include Aspirin, Beta blockers, calcium channel blockers, ACE Inhibitors, angiotensin receptor blockers, diuretics, digoxin, and antihyperlipidemics.   Congestive Heart Failure -Discuss the definition of CHF, how to live with CHF, the signs and symptoms of CHF, and how keep track of weight and sodium intake.   Heart Disease and  Intimacy -Discus the effect sexual activity has on the heart, how changes occur during intimacy as we age, and safety during sexual activity.   Smoking Cessation / COPD -Discuss different methods to quit smoking, the health benefits of quitting smoking, and the definition of COPD.   Nutrition I: Fats -Discuss the types of cholesterol, what cholesterol does to the heart, and how cholesterol levels can be controlled.   Nutrition II: Labels -Discuss the different components of food labels and how to read food label   Heart Parts/Heart Disease and PAD -Discuss the anatomy of the heart, the pathway of blood circulation through the heart, and these are affected by heart disease.   Stress I: Signs and Symptoms -Discuss the causes of stress, how stress may lead to anxiety and depression, and ways to limit stress.   Stress II: Relaxation -Discuss different types of relaxation techniques to limit stress.   Warning Signs of Stroke / TIA -Discuss definition of a stroke, what the signs and symptoms are of a stroke, and how to identify when someone is having stroke.   Knowledge Questionnaire Score: Knowledge Questionnaire Score - 12/18/17 1630      Knowledge Questionnaire Score   Pre Score  26/28       Core Components/Risk Factors/Patient Goals at Admission: Personal Goals and Risk Factors at Admission - 12/18/17 1631      Core Components/Risk Factors/Patient Goals  on Admission    Weight Management  Yes    Intervention  Weight Management/Obesity: Establish reasonable short term and long term weight goals.    Admit Weight  199 lb 6.4 oz (90.4 kg)    Goal Weight: Short Term  194 lb 6.4 oz (88.2 kg)    Goal Weight: Long Term  189 lb 6.4 oz (85.9 kg)    Expected Outcomes  Short Term: Continue to assess and modify interventions until short term weight is achieved;Long Term: Adherence to nutrition and physical activity/exercise program aimed toward attainment of established weight goal    Personal Goal Other  Yes    Personal Goal  Patient would like to lose on short-term 3lbs month while in program and on a long term basis lose 40lbs overall.    Intervention  Attend CR 3 x week and supplement with 2 x week exercise at home.    Expected Outcomes  Achieve short-term and long-term goals.        Core Components/Risk Factors/Patient Goals Review:    Core Components/Risk Factors/Patient Goals at Discharge (Final Review):    ITP Comments:   Comments: Patient arrived for 1st visit/orientation/education at 1230. Patient was referred to CR by Dr. Diona Browner due to NSTEMI (I21.4) and Stent Placement (Z95.5). During orientation advised patient on arrival and appointment times what to wear, what to do before, during and after exercise. Reviewed attendance and class policy. Talked about inclement weather and class consultation policy. Pt is scheduled to return Cardiac Rehab on 12/22/17 at 1545. Pt was advised to come to class 15 minutes before class starts. Patient was also given instructions on meeting with the dietician and attending the Family Structure classes. Discussed RPE/Dpysnea scales. Discussed initial THR and how to find their radial and/or carotid pulse. Discussed the initial exercise prescription and how this effects their progress. Pt is eager to get started. Patient participated in warm up stretches followed by light weights and resistance bands. Patient was  able to complete 6 minute walk test. Patient c/o mild (R) upper chest discomfort. Discomfort subsided at 2 minute  rest. Discomfort was completely gone at the end of orientation. Patient was measured for the equipment. Discussed equipment safety with patient. Took patient pre-anthropometric measurements. Patient finished visit at 1530.

## 2017-12-19 ENCOUNTER — Telehealth: Payer: Self-pay | Admitting: *Deleted

## 2017-12-19 LAB — LIPID PANEL
Cholesterol: 149 mg/dL (ref <200)
HDL: 45 mg/dL — ABNORMAL LOW (ref >50)
LDL Cholesterol (Calc): 85 mg/dL (calc)
Non-HDL Cholesterol (Calc): 104 mg/dL (calc) (ref <130)
TRIGLYCERIDES: 91 mg/dL (ref <150)
Total CHOL/HDL Ratio: 3.3 (calc) (ref <5.0)

## 2017-12-19 NOTE — Telephone Encounter (Signed)
-----   Message from Jonelle SidleSamuel G McDowell, MD sent at 12/19/2017 10:28 AM EDT ----- Results reviewed.  Cholesterol has come down somewhat to 149, also LDL down to 85. A copy of this test should be forwarded to Bennie PieriniMartin, [name], FNP.

## 2017-12-22 ENCOUNTER — Encounter (HOSPITAL_COMMUNITY)
Admission: RE | Admit: 2017-12-22 | Discharge: 2017-12-22 | Disposition: A | Discharge status: Home / Self Care | Payer: BLUE CROSS/BLUE SHIELD | Source: Ambulatory Visit | Attending: Cardiology | Admitting: Cardiology

## 2017-12-22 DIAGNOSIS — E782 Mixed hyperlipidemia: Secondary | ICD-10-CM | POA: Diagnosis not present

## 2017-12-22 DIAGNOSIS — I2510 Atherosclerotic heart disease of native coronary artery without angina pectoris: Secondary | ICD-10-CM | POA: Diagnosis not present

## 2017-12-22 DIAGNOSIS — I10 Essential (primary) hypertension: Secondary | ICD-10-CM | POA: Diagnosis not present

## 2017-12-22 DIAGNOSIS — F17210 Nicotine dependence, cigarettes, uncomplicated: Secondary | ICD-10-CM | POA: Diagnosis not present

## 2017-12-22 DIAGNOSIS — Z955 Presence of coronary angioplasty implant and graft: Secondary | ICD-10-CM

## 2017-12-22 DIAGNOSIS — I214 Non-ST elevation (NSTEMI) myocardial infarction: Secondary | ICD-10-CM

## 2017-12-22 DIAGNOSIS — Z7982 Long term (current) use of aspirin: Secondary | ICD-10-CM | POA: Diagnosis not present

## 2017-12-22 DIAGNOSIS — Z79899 Other long term (current) drug therapy: Secondary | ICD-10-CM | POA: Diagnosis not present

## 2017-12-22 NOTE — Progress Notes (Signed)
Daily Session Note  Patient Details  Name: Stacy Douglas MRN: [id number] Date of Birth: [birth year] Referring Provider:     [place] from 12/18/2017 in [place]  Referring Provider  Domenic Polite      Encounter Date: 12/22/2017  Check In: Session Check In - 12/22/17 1545      Check-In   Supervising physician immediately available to respond to emergencies  See telemetry face sheet for immediately available MD    Location  [facility]    Staff Present  Russella Dar, MS, EP, [facility], Exercise Physiologist;Debra Wynetta Emery, RN, BSN    Medication changes reported      No    Fall or balance concerns reported     No    Tobacco Cessation  Use Decreased    Warm-up and Cool-down  Performed as group-led instruction    Resistance Training Performed  Yes    VAD Patient?  No    PAD/SET Patient?  No      Pain Assessment   Currently in Pain?  No/denies    Pain Score  0-No pain    Multiple Pain Sites  No       Capillary Blood Glucose: No results found for this or any previous visit (from the past 24 hour(s)).    Social History   Tobacco Use  Smoking Status Current Some Day Smoker  . Packs/day: 0.01  . Years: 53.00  . Pack years: 0.53  . Types: Cigarettes  . Start date: 08/10/1964  Smokeless Tobacco Never Used  Tobacco Comment   She is weaning herself down.     Goals Met:  Independence with exercise equipment Exercise tolerated well Personal goals reviewed No report of cardiac concerns or symptoms Strength training completed today  Goals Unmet:  Not Applicable  Comments: Pt able to follow exercise prescription today without complaint.  Will continue to monitor for progression. Check out: 1645   Dr. Kate Sable is Medical Director for [place] and Pulmonary Rehab.

## 2017-12-22 NOTE — Progress Notes (Signed)
Cardiac Individual Treatment Plan  Patient Details  Name: Stacy Douglas MRN: [id number] Date of Birth: 1950/11/18 Referring Provider:     CARDIAC REHAB PHASE II ORIENTATION from 12/18/2017 in [place] CARDIAC REHABILITATION  Referring Provider  Diona Browner      Initial Encounter Date:    CARDIAC REHAB PHASE II ORIENTATION from 12/18/2017 in [place] CARDIAC REHABILITATION  Date  12/18/17      Visit Diagnosis: NSTEMI (non-ST elevated myocardial infarction) [facility])  Status post coronary artery stent placement  Patient's Home Medications on Admission:  Current Outpatient Medications:  .  aspirin EC 81 MG tablet, Take 81 mg by mouth daily., Disp: , Rfl:  .  ezetimibe (ZETIA) 10 MG tablet, Take 1 tablet (10 mg total) by mouth daily., Disp: 90 tablet, Rfl: 0 .  metoprolol succinate (TOPROL XL) 25 MG 24 hr tablet, Take 0.5 tablets (12.5 mg total) by mouth daily., Disp: 45 tablet, Rfl: 3 .  prasugrel (EFFIENT) 10 MG TABS tablet, Take 1 tablet (10 mg total) by mouth daily., Disp: 30 tablet, Rfl: 3 .  nitroGLYCERIN (NITROSTAT) 0.4 MG SL tablet, Place 1 tablet (0.4 mg total) under the tongue every 5 (five) minutes x 3 doses as needed., Disp: 25 tablet, Rfl: 3  Past Medical History: Past Medical History:  Diagnosis Date  . Coronary atherosclerosis of native coronary artery    a. VF arrest in [place] s/p BMS LAD 5/11, PTCA circumflex 6/11, LVEF 40%. b. [place] 10/2014 s/p DES to prox and mid RCA, otherwise patent LAD stent and moderate disease in the Cx, LVEF 55-65%. c. low-risk NST in 06/2017 d. 09/2017: NSTEMI with cath showing 99% mid-LCx stenosis --> treated with DES; patent stents along LAD and RCA.   Marland Kitchen DVT (deep venous thrombosis) (HCC) 1992   LLE  . Essential hypertension   . History of duodenal ulcer 1960s  . Mixed hyperlipidemia   . Myocardial infarction (HCC) 08/2009   complicated by VF arrest  . NSTEMI (non-ST elevated myocardial infarction) (HCC) 10/07/2017   Stacy Douglas 10/07/2017  .  Obesity   . Plavix resistance   . Tobacco abuse     Tobacco Use: Social History   Tobacco Use  Smoking Status Current Some Day Smoker  . Packs/day: 0.01  . Years: 53.00  . Pack years: 0.53  . Types: Cigarettes  . Start date: 08/10/1964  Smokeless Tobacco Never Used  Tobacco Comment   She is weaning herself down.     Labs: Recent Review Flowsheet Data    Labs for ITP Cardiac and Pulmonary Rehab Latest Ref Rng & Units 11/16/2014 03/04/2015 07/31/2016 10/08/2017 12/18/2017   Cholestrol <200 mg/dL [phone]   LDLCALC mg/dL (calc) 95 95 99 94 85   HDL >50 mg/dL 21(H) 08(M) 57(Q) 46(N) 45(L)   Trlycerides <150 mg/dL 629 64 528 413 91   Hemoglobin A1c 4.8 - 5.6 % - - - - -      Capillary Blood Glucose: No results found for: GLUCAP   Exercise Target Goals: Exercise Program Goal: Individual exercise prescription set using results from initial 6 min walk test and THRR while considering  patient's activity barriers and safety.   Exercise Prescription Goal: Starting with aerobic activity 30 plus minutes a day, 3 days per week for initial exercise prescription. Provide home exercise prescription and guidelines that participant acknowledges understanding prior to discharge.  Activity Barriers & Risk Stratification: Activity Barriers & Cardiac Risk Stratification - 12/18/17 1609  Activity Barriers & Cardiac Risk Stratification   Activity Barriers  None    Cardiac Risk Stratification  High       6 Minute Walk: 6 Minute Walk    Row Name 12/18/17 1606         6 Minute Walk   Phase  Initial     Distance  1450 feet     Walk Time  6 minutes     # of Rest Breaks  0     MPH  2.75     METS  3.1     RPE  13     Perceived Dyspnea   9     VO2 Peak  10.67     Symptoms  Yes (comment)     Comments  Had a little discomfort in (R) upper chest. Went away after 2minute rest.     Resting HR  81 bpm     Resting BP  114/60     Resting Oxygen Saturation   98 %     Exercise  Oxygen Saturation  during 6 min walk  90 %     Max Ex. HR  96 bpm     Max Ex. BP  140/80     2 Minute Post BP  118/70        Oxygen Initial Assessment:   Oxygen Re-Evaluation:   Oxygen Discharge (Final Oxygen Re-Evaluation):   Initial Exercise Prescription: Initial Exercise Prescription - 12/18/17 1600      Date of Initial Exercise RX and Referring Provider   Date  12/18/17    Referring Provider  [name]    Expected Discharge Date  03/20/18      Treadmill   MPH  1.7    Grade  0    Minutes  17    METs  2.3      Recumbant Elliptical   Level  1    RPM  43    Watts  46    Minutes  22    METs  2.7      Prescription Details   Frequency (times per week)  3    Duration  Progress to 30 minutes of continuous aerobic without signs/symptoms of physical distress      Intensity   THRR 40-80% of Max Heartrate  111-125-140    Ratings of Perceived Exertion  11-13    Perceived Dyspnea  0-4      Progression   Progression  Continue progressive overload as per policy without signs/symptoms or physical distress.      Resistance Training   Training Prescription  Yes    Weight  1    Reps  10-15       Perform Capillary Blood Glucose checks as needed.  Exercise Prescription Changes:   Exercise Comments:   Exercise Goals and Review:  Exercise Goals    Row Name 12/18/17 1614             Exercise Goals   Able to understand and use rate of perceived exertion (RPE) scale  Yes       Intervention  Provide education and explanation on how to use RPE scale       Expected Outcomes  Short Term: Able to use RPE daily in rehab to express subjective intensity level;Long Term:  Able to use RPE to guide intensity level when exercising independently       Knowledge and understanding of Target Heart Rate Range (THRR)  Yes  Intervention  Provide education and explanation of THRR including how the numbers were predicted and where they are located for reference       Expected  Outcomes  Short Term: Able to use daily as guideline for intensity in rehab;Long Term: Able to use THRR to govern intensity when exercising independently       Able to check pulse independently  Yes       Intervention  Provide education and demonstration on how to check pulse in carotid and radial arteries.       Expected Outcomes  Short Term: Able to explain why pulse checking is important during independent exercise;Long Term: Able to check pulse independently and accurately       Understanding of Exercise Prescription  Yes       Intervention  Provide education, explanation, and written materials on patient's individual exercise prescription       Expected Outcomes  Short Term: Able to explain program exercise prescription;Long Term: Able to explain home exercise prescription to exercise independently          Exercise Goals Re-Evaluation :    Discharge Exercise Prescription (Final Exercise Prescription Changes):   Nutrition:  Target Goals: Understanding of nutrition guidelines, daily intake of sodium 1500mg , cholesterol 200mg , calories 30% from fat and 7% or less from saturated fats, daily to have 5 or more servings of fruits and vegetables.  Biometrics: Pre Biometrics - 12/18/17 1616      Pre Biometrics   Height  5\' 4"  (1.626 m)    Weight  90.4 kg    Waist Circumference  42.5 inches    Hip Circumference  49 inches    Waist to Hip Ratio  0.87 %    BMI (Calculated)  34.21    Triceps Skinfold  23 mm    % Body Fat  43.9 %    Grip Strength  5.71 kg    Flexibility  12.8 in    Single Leg Stand  4 seconds        Nutrition Therapy Plan and Nutrition Goals: Nutrition Therapy & Goals - 12/18/17 1629      Personal Nutrition Goals   Personal Goal #2  Patient is eating a heart healthy diet currently.     Additional Goals?  No       Nutrition Assessments: Nutrition Assessments - 12/18/17 1629      MEDFICTS Scores   Pre Score  37       Nutrition Goals  Re-Evaluation:   Nutrition Goals Discharge (Final Nutrition Goals Re-Evaluation):   Psychosocial: Target Goals: Acknowledge presence or absence of significant depression and/or stress, maximize coping skills, provide positive support system. Participant is able to verbalize types and ability to use techniques and skills needed for reducing stress and depression.  Initial Review & Psychosocial Screening: Initial Psych Review & Screening - 12/18/17 1625      Initial Review   Current issues with  None Identified      Family Dynamics   Good Support System?  Yes      Barriers   Psychosocial barriers to participate in program  There are no identifiable barriers or psychosocial needs.      Screening Interventions   Interventions  Encouraged to exercise    Expected Outcomes  Short Term goal: Identification and review with participant of any Quality of Life or Depression concerns found by scoring the questionnaire.;Long Term goal: The participant improves quality of Life and PHQ9 Scores as seen by post scores  and/or verbalization of changes       Quality of Life Scores: Quality of Life - 12/18/17 1628      Quality of Life   Select  Quality of Life      Quality of Life Scores   Health/Function Pre  23.73 %    Socioeconomic Pre  24.36 %    Psych/Spiritual Pre  23.14 %    Family Pre  25.5 %    GLOBAL Pre  23.97 %      Scores of 19 and below usually indicate a poorer quality of life in these areas.  A difference of  2-3 points is a clinically meaningful difference.  A difference of 2-3 points in the total score of the Quality of Life Index has been associated with significant improvement in overall quality of life, self-image, physical symptoms, and general health in studies assessing change in quality of life.  PHQ-9: Recent Review Flowsheet Data    Depression screen [facility] 2/9 08/12/2016 05/30/2015 12/14/2014 12/14/2014 11/02/2013   Decreased Interest 0 0 0 0 0   Down, Depressed, Hopeless  0 0 0 0 0   PHQ - 2 Score 0 0 0 0 0   Altered sleeping - - 1 - -   Tired, decreased energy - - 1 - -   PHQ-9 Score - - 2 - -     Interpretation of Total Score  Total Score Depression Severity:  1-4 = Minimal depression, 5-9 = Mild depression, 10-14 = Moderate depression, 15-19 = Moderately severe depression, 20-27 = Severe depression   Psychosocial Evaluation and Intervention: Psychosocial Evaluation - 12/18/17 1627      Psychosocial Evaluation & Interventions   Interventions  Encouraged to exercise with the program and follow exercise prescription    Continue Psychosocial Services   No Follow up required       Psychosocial Re-Evaluation:   Psychosocial Discharge (Final Psychosocial Re-Evaluation):   Vocational Rehabilitation: Provide vocational rehab assistance to qualifying candidates.   Vocational Rehab Evaluation & Intervention: Vocational Rehab - 12/18/17 1631      Initial Vocational Rehab Evaluation & Intervention   Assessment shows need for Vocational Rehabilitation  No       Education: Education Goals: Education classes will be provided on a weekly basis, covering required topics. Participant will state understanding/return demonstration of topics presented.  Learning Barriers/Preferences: Learning Barriers/Preferences - 12/18/17 1630      Learning Barriers/Preferences   Learning Barriers  None    Learning Preferences  Pictoral;Computer/Internet;Video;Written Material       Education Topics: Hypertension, Hypertension Reduction -Define heart disease and high blood pressure. Discus how high blood pressure affects the body and ways to reduce high blood pressure.   Exercise and Your Heart -Discuss why it is important to exercise, the FITT principles of exercise, normal and abnormal responses to exercise, and how to exercise safely.   Angina -Discuss definition of angina, causes of angina, treatment of angina, and how to decrease risk of having  angina.   Cardiac Medications -Review what the following cardiac medications are used for, how they affect the body, and side effects that may occur when taking the medications.  Medications include Aspirin, Beta blockers, calcium channel blockers, ACE Inhibitors, angiotensin receptor blockers, diuretics, digoxin, and antihyperlipidemics.   Congestive Heart Failure -Discuss the definition of CHF, how to live with CHF, the signs and symptoms of CHF, and how keep track of weight and sodium intake.   Heart Disease and Intimacy -Discus the effect  sexual activity has on the heart, how changes occur during intimacy as we age, and safety during sexual activity.   Smoking Cessation / COPD -Discuss different methods to quit smoking, the health benefits of quitting smoking, and the definition of COPD.   Nutrition I: Fats -Discuss the types of cholesterol, what cholesterol does to the heart, and how cholesterol levels can be controlled.   Nutrition II: Labels -Discuss the different components of food labels and how to read food label   Heart Parts/Heart Disease and PAD -Discuss the anatomy of the heart, the pathway of blood circulation through the heart, and these are affected by heart disease.   Stress I: Signs and Symptoms -Discuss the causes of stress, how stress may lead to anxiety and depression, and ways to limit stress.   Stress II: Relaxation -Discuss different types of relaxation techniques to limit stress.   Warning Signs of Stroke / TIA -Discuss definition of a stroke, what the signs and symptoms are of a stroke, and how to identify when someone is having stroke.   Knowledge Questionnaire Score: Knowledge Questionnaire Score - 12/18/17 1630      Knowledge Questionnaire Score   Pre Score  26/28       Core Components/Risk Factors/Patient Goals at Admission: Personal Goals and Risk Factors at Admission - 12/18/17 1631      Core Components/Risk Factors/Patient Goals  on Admission    Weight Management  Yes    Intervention  Weight Management/Obesity: Establish reasonable short term and long term weight goals.    Admit Weight  199 lb 6.4 oz (90.4 kg)    Goal Weight: Short Term  194 lb 6.4 oz (88.2 kg)    Goal Weight: Long Term  189 lb 6.4 oz (85.9 kg)    Expected Outcomes  Short Term: Continue to assess and modify interventions until short term weight is achieved;Long Term: Adherence to nutrition and physical activity/exercise program aimed toward attainment of established weight goal    Personal Goal Other  Yes    Personal Goal  Patient would like to lose on short-term 3lbs month while in program and on a long term basis lose 40lbs overall.    Intervention  Attend CR 3 x week and supplement with 2 x week exercise at home.    Expected Outcomes  Achieve short-term and long-term goals.        Core Components/Risk Factors/Patient Goals Review:    Core Components/Risk Factors/Patient Goals at Discharge (Final Review):    ITP Comments: ITP Comments    Row Name 12/22/17 1405           ITP Comments  Patient new to program. Plans to start Monday 12/22/17.          Comments:ITP REVIEW Patient new to program. Plans to start Monday 12/22/17.

## 2017-12-23 NOTE — Telephone Encounter (Signed)
Patient informed. 

## 2017-12-24 ENCOUNTER — Encounter (HOSPITAL_COMMUNITY)
Admission: RE | Admit: 2017-12-24 | Discharge: 2017-12-24 | Disposition: A | Discharge status: Home / Self Care | Payer: BLUE CROSS/BLUE SHIELD | Source: Ambulatory Visit | Attending: Cardiology | Admitting: Cardiology

## 2017-12-24 DIAGNOSIS — Z955 Presence of coronary angioplasty implant and graft: Secondary | ICD-10-CM | POA: Diagnosis not present

## 2017-12-24 DIAGNOSIS — I10 Essential (primary) hypertension: Secondary | ICD-10-CM | POA: Diagnosis not present

## 2017-12-24 DIAGNOSIS — F17210 Nicotine dependence, cigarettes, uncomplicated: Secondary | ICD-10-CM | POA: Diagnosis not present

## 2017-12-24 DIAGNOSIS — I214 Non-ST elevation (NSTEMI) myocardial infarction: Secondary | ICD-10-CM

## 2017-12-24 DIAGNOSIS — I2510 Atherosclerotic heart disease of native coronary artery without angina pectoris: Secondary | ICD-10-CM | POA: Diagnosis not present

## 2017-12-24 DIAGNOSIS — Z7982 Long term (current) use of aspirin: Secondary | ICD-10-CM | POA: Diagnosis not present

## 2017-12-24 DIAGNOSIS — Z79899 Other long term (current) drug therapy: Secondary | ICD-10-CM | POA: Diagnosis not present

## 2017-12-24 DIAGNOSIS — E782 Mixed hyperlipidemia: Secondary | ICD-10-CM | POA: Diagnosis not present

## 2017-12-24 NOTE — Progress Notes (Signed)
Daily Session Note  Patient Details  Name: Stacy Douglas MRN: [id number] Date of Birth: November 09, 1950 Referring Provider:     [place] from 12/18/2017 in [place]  Referring Provider  Domenic Polite      Encounter Date: 12/24/2017  Check In: Session Check In - 12/24/17 1627      Check-In   Supervising physician immediately available to respond to emergencies  See telemetry face sheet for immediately available MD    Location  [facility]    Staff Present  Russella Dar, MS, EP, [facility], Exercise Physiologist;Debra Wynetta Emery, RN, BSN    Medication changes reported      No    Fall or balance concerns reported     No    Tobacco Cessation  Use Decreased    Warm-up and Cool-down  Performed as group-led instruction    Resistance Training Performed  Yes    VAD Patient?  No    PAD/SET Patient?  No      Pain Assessment   Currently in Pain?  No/denies    Pain Score  0-No pain    Multiple Pain Sites  No       Capillary Blood Glucose: No results found for this or any previous visit (from the past 24 hour(s)).    Social History   Tobacco Use  Smoking Status Current Some Day Smoker  . Packs/day: 0.01  . Years: 53.00  . Pack years: 0.53  . Types: Cigarettes  . Start date: 08/10/1964  Smokeless Tobacco Never Used  Tobacco Comment   She is weaning herself down.     Goals Met:  Independence with exercise equipment Exercise tolerated well Personal goals reviewed No report of cardiac concerns or symptoms Strength training completed today  Goals Unmet:  Not Applicable  Comments: Pt able to follow exercise prescription today without complaint.  Will continue to monitor for progression. Check out: 1645   Dr. Kate Sable is Medical Director for [place] and Pulmonary Rehab.

## 2017-12-26 ENCOUNTER — Ambulatory Visit: Payer: BLUE CROSS/BLUE SHIELD | Admitting: Cardiology

## 2017-12-26 ENCOUNTER — Encounter: Payer: Self-pay | Admitting: Cardiology

## 2017-12-26 ENCOUNTER — Encounter (HOSPITAL_COMMUNITY)
Admission: RE | Admit: 2017-12-26 | Discharge: 2017-12-26 | Disposition: A | Discharge status: Home / Self Care | Payer: BLUE CROSS/BLUE SHIELD | Source: Ambulatory Visit | Attending: Cardiology | Admitting: Cardiology

## 2017-12-26 VITALS — BP 138/84 | HR 81 | Ht 64.0 in | Wt 199.0 lb

## 2017-12-26 DIAGNOSIS — I2510 Atherosclerotic heart disease of native coronary artery without angina pectoris: Secondary | ICD-10-CM | POA: Diagnosis not present

## 2017-12-26 DIAGNOSIS — E782 Mixed hyperlipidemia: Secondary | ICD-10-CM | POA: Diagnosis not present

## 2017-12-26 DIAGNOSIS — Z789 Other specified health status: Secondary | ICD-10-CM | POA: Diagnosis not present

## 2017-12-26 DIAGNOSIS — I214 Non-ST elevation (NSTEMI) myocardial infarction: Secondary | ICD-10-CM

## 2017-12-26 DIAGNOSIS — Z955 Presence of coronary angioplasty implant and graft: Secondary | ICD-10-CM | POA: Diagnosis not present

## 2017-12-26 DIAGNOSIS — I25119 Atherosclerotic heart disease of native coronary artery with unspecified angina pectoris: Secondary | ICD-10-CM | POA: Diagnosis not present

## 2017-12-26 DIAGNOSIS — Z7982 Long term (current) use of aspirin: Secondary | ICD-10-CM | POA: Diagnosis not present

## 2017-12-26 DIAGNOSIS — F17210 Nicotine dependence, cigarettes, uncomplicated: Secondary | ICD-10-CM | POA: Diagnosis not present

## 2017-12-26 DIAGNOSIS — I10 Essential (primary) hypertension: Secondary | ICD-10-CM

## 2017-12-26 DIAGNOSIS — Z79899 Other long term (current) drug therapy: Secondary | ICD-10-CM | POA: Diagnosis not present

## 2017-12-26 NOTE — Progress Notes (Signed)
Cardiology Office Note  Date: 12/26/2017   ID: Stacy Douglas, DOB 10/10/1950, MRN [id number]  PCP: Bennie PieriniMartin, [name], FNP  Primary Cardiologist: Nona DellSamuel Kyandra Mcclaine, MD   Chief Complaint  Patient presents with  . Coronary Artery Disease    History of Present Illness: Stacy Douglas is a 67 y.o. female last seen by Ms. Strader PA-C in June.  I reviewed her records since our last encounter.  She presents for a routine follow-up visit, overall doing very well.  She does not report any angina symptoms or unusual shortness of breath.  She is currently in cardiac rehabilitation.  Continues to work full-time at [employer] although will probably be retiring within the year.  Reviewed her medications.  She did not tolerate Zetia and stop the medication after a week.  She reports nausea and malaise.  She has known statin intolerance.  Her follow-up lipid numbers as outlined below actually did look somewhat better with LDL down to 85 and HDL up to 45.  She does not want to consider lipid clinic evaluation at this point.  She is tolerating aspirin and Effient, no bleeding problems.  Past Medical History:  Diagnosis Date  . Coronary atherosclerosis of native coronary artery    a. VF arrest in [place] s/p BMS LAD 5/11, PTCA circumflex 6/11, LVEF 40%. b. [place] 10/2014 s/p DES to prox and mid RCA, otherwise patent LAD stent and moderate disease in the Cx, LVEF 55-65%. c. low-risk NST in 06/2017 d. 09/2017: NSTEMI with cath showing 99% mid-LCx stenosis --> treated with DES; patent stents along LAD and RCA.   Marland Kitchen. DVT (deep venous thrombosis) (HCC) 1992   LLE  . Essential hypertension   . History of duodenal ulcer 1960s  . Mixed hyperlipidemia   . Myocardial infarction (HCC) 08/2009   complicated by VF arrest  . NSTEMI (non-ST elevated myocardial infarction) (HCC) 10/07/2017   [name]/notes 10/07/2017  . Obesity   . Plavix resistance   . Tobacco abuse     Past Surgical History:  Procedure Laterality Date    . APPENDECTOMY    . BREAST BIOPSY Right   . CARDIAC CATHETERIZATION N/A 11/16/2014   Procedure: Left Heart Cath and Coronary Angiography;  Surgeon: Iran OuchMuhammad A Arida, MD;  Location: [facility] INVASIVE CV [facility];  Service: Cardiovascular;  Laterality: N/A;  . CARDIAC CATHETERIZATION N/A 11/16/2014   Procedure: Coronary Stent Intervention;  Surgeon: Iran OuchMuhammad A Arida, MD;  Location: [facility] INVASIVE CV LAB;  Service: Cardiovascular;  Laterality: N/A;  . CATARACT EXTRACTION W/ INTRAOCULAR LENS IMPLANT Right 1990s  . CORONARY ANGIOPLASTY WITH STENT PLACEMENT  2011  . CORONARY ANGIOPLASTY WITH STENT PLACEMENT  10/07/2017  . CORONARY STENT INTERVENTION N/A 10/07/2017   Procedure: CORONARY STENT INTERVENTION;  Surgeon: SwazilandJordan, Peter M, MD;  [facility];  Service: Cardiovascular;  Laterality: N/A;  . LEFT HEART CATH AND CORONARY ANGIOGRAPHY N/A 10/07/2017   Procedure: LEFT HEART CATH AND CORONARY ANGIOGRAPHY;  Surgeon: SwazilandJordan, Peter M, MD;  [facility];  Service: Cardiovascular;  Laterality: N/A;  . MASS EXCISION  1980s   "tooks lumps off my neck; front and back"    Current Outpatient Medications  Medication Sig Dispense Refill  . aspirin EC 81 MG tablet Take 81 mg by mouth daily.    . metoprolol succinate (TOPROL XL) 25 MG 24 hr tablet Take 0.5 tablets (12.5 mg total) by mouth daily. 45 tablet 3  . nitroGLYCERIN (NITROSTAT) 0.4 MG SL tablet Place 1 tablet (  0.4 mg total) under the tongue every 5 (five) minutes x 3 doses as needed. 25 tablet 3  . prasugrel (EFFIENT) 10 MG TABS tablet Take 1 tablet (10 mg total) by mouth daily. 30 tablet 3   No current facility-administered medications for this visit.    Allergies:  Crestor [rosuvastatin calcium]; Plavix [clopidogrel bisulfate]; and Codeine   Social History: The patient  reports that she has been smoking cigarettes. She started smoking about 53 years ago. She has a 0.53 pack-year smoking history. She has never used smokeless tobacco.  She reports that she drank alcohol. She reports that she has current or past drug history.   ROS:  Please see the history of present illness. Otherwise, complete review of systems is positive for none.  All other systems are reviewed and negative.   Physical Exam: VS:  BP 138/84   Pulse 81   Ht 5\' 4"  (1.626 m)   Wt 199 lb (90.3 kg)   SpO2 97%   BMI 34.16 kg/m , BMI Body mass index is 34.16 kg/m.  Wt Readings from Last 3 Encounters:  12/26/17 199 lb (90.3 kg)  12/18/17 199 lb 6.4 oz (90.4 kg)  10/21/17 196 lb (88.9 kg)    General: Patient appears comfortable at rest. HEENT: Conjunctiva and lids normal, oropharynx clear. Neck: Supple, no elevated JVP or carotid bruits, no thyromegaly. Lungs: Clear to auscultation, nonlabored breathing at rest. Cardiac: Regular rate and rhythm, no S3 or significant systolic murmur. Abdomen: Soft, nontender, bowel sounds present. Extremities: No pitting edema, distal pulses 2+. Skin: Warm and dry. Musculoskeletal: No kyphosis. Neuropsychiatric: Alert and oriented x3, affect grossly appropriate.  ECG: I personally reviewed the tracing from 10/21/2017 which showed sinus rhythm with low voltage, left anterior fascicular block, nonspecific T wave changes.  Recent Labwork: 07/01/2017: TSH 2.159 10/07/2017: ALT 23; AST 46 10/08/2017: BUN 9; Creatinine, Ser 0.88; Hemoglobin 13.7; Platelets 271; Potassium 4.0; Sodium 142     Component Value Date/Time   CHOL 149 12/18/2017 1202   TRIG 91 12/18/2017 1202   TRIG 118 11/02/2013 1428   HDL 45 (L) 12/18/2017 1202   HDL 41 11/02/2013 1428   CHOLHDL 3.3 12/18/2017 1202   VLDL 23 10/08/2017 0510   LDLCALC 85 12/18/2017 1202   LDLCALC 103 (H) 11/02/2013 1428    Other Studies Reviewed Today:  Cardiac catheterization and PCI 10/07/2017:  Prox RCA to Mid RCA lesion is 10% stenosed.  Dist RCA lesion is 50% stenosed.  Acute Mrg lesion is 75% stenosed.  Prox LAD to Mid LAD lesion is 10% stenosed.  Mid LAD  lesion is 30% stenosed.  Prox Cx-1 lesion is 20% stenosed.  Prox Cx-2 lesion is 99% stenosed.  Post intervention, there is a 0% residual stenosis.  A drug-eluting stent was successfully placed using a STENT SYNERGY DES 2.5X12.  The left ventricular systolic function is normal.  LV end diastolic pressure is normal.  The left ventricular ejection fraction is 50-55% by visual estimate.   1. Single vessel obstructive CAD- 99% mid LCx 2. Prior stents in LAD and RCA are patent. 3. Low normal LV function. EF 50-55% with inferobasal HK. 4. Normal LVEDP 5. Successful stenting of the mid LCx with DES  Assessment and Plan:  1.  Multivessel CAD status post BMS to the LAD, DES to the proximal and mid RCA, and previous angioplasty of the circumflex.  More recently she is status post NSTEMI in June with DES intervention to the mid circumflex.  At this  point she is stable without active angina and tolerating cardiac rehabilitation.  Continue aspirin and Effient.  2.  Hyperlipidemia with statin intolerance.  Recent follow-up LDL was 85.  She did not tolerate Zetia either.  Currently not interested in pursuing PCSK-9 inhibitor evaluation.  3.  Essential hypertension, no changes made to present regimen.  Keep follow-up with PCP.  Current medicines were reviewed with the patient today.   Orders Placed This Encounter  Procedures  . Lipid Profile    Disposition: Follow-up in 4 months.  Signed, Jonelle Sidle, MD, [facility] 12/26/2017 2:39 PM    [facility] at [facility] [address] [place], [place] [address] Phone: [phone]; Fax: [phone]

## 2017-12-26 NOTE — Patient Instructions (Addendum)
Your physician recommends that you schedule a follow-up appointment in: 4 months with Dr.McDowell    STOP Zetia   All other meds stay the same   If you need a refill on your cardiac medications before your next appointment, please call your pharmacy.    Get FASTING Lipids JUST BEFORE NEXT VISIT     Thank you for choosing [facility] !

## 2017-12-26 NOTE — Progress Notes (Addendum)
Daily Session Note  Patient Details  Name: Stacy Douglas MRN: [id number] Date of Birth: January 19, 1951 Referring Provider:     [place] from 12/18/2017 in [place]  Referring Provider  Domenic Polite      Encounter Date: 12/26/2017  Check In: Session Check In - 12/26/17 1545      Check-In   Supervising physician immediately available to respond to emergencies  See telemetry face sheet for immediately available MD    Location  [facility]    Staff Present  Russella Dar, MS, EP, [facility], Exercise Physiologist;Haidyn Chadderdon Wynetta Emery, RN, BSN    Medication changes reported      No    Fall or balance concerns reported     No    Warm-up and Cool-down  Performed as group-led instruction    Resistance Training Performed  Yes    VAD Patient?  No    PAD/SET Patient?  No      Pain Assessment   Currently in Pain?  No/denies    Pain Score  0-No pain    Multiple Pain Sites  No       Capillary Blood Glucose: No results found for this or any previous visit (from the past 24 hour(s)).    Social History   Tobacco Use  Smoking Status Current Some Day Smoker  . Packs/day: 0.01  . Years: 53.00  . Pack years: 0.53  . Types: Cigarettes  . Start date: 08/10/1964  Smokeless Tobacco Never Used  Tobacco Comment   She is weaning herself down.     Goals Met:  Independence with exercise equipment Exercise tolerated well No report of cardiac concerns or symptoms Strength training completed today  Goals Unmet:  Not Applicable  Comments: Pt able to follow exercise prescription today without complaint.  Will continue to monitor for progression.. Check out 1645.   Dr. Kate Sable is Medical Director for [facility] Card[facility] and Pulmonary Rehab.

## 2017-12-29 ENCOUNTER — Encounter (HOSPITAL_COMMUNITY): Payer: BLUE CROSS/BLUE SHIELD

## 2017-12-29 DIAGNOSIS — I214 Non-ST elevation (NSTEMI) myocardial infarction: Secondary | ICD-10-CM | POA: Insufficient documentation

## 2017-12-29 DIAGNOSIS — I10 Essential (primary) hypertension: Secondary | ICD-10-CM | POA: Insufficient documentation

## 2017-12-29 DIAGNOSIS — Z955 Presence of coronary angioplasty implant and graft: Secondary | ICD-10-CM | POA: Insufficient documentation

## 2017-12-29 DIAGNOSIS — E782 Mixed hyperlipidemia: Secondary | ICD-10-CM | POA: Insufficient documentation

## 2017-12-29 DIAGNOSIS — I2510 Atherosclerotic heart disease of native coronary artery without angina pectoris: Secondary | ICD-10-CM | POA: Insufficient documentation

## 2017-12-29 DIAGNOSIS — Z79899 Other long term (current) drug therapy: Secondary | ICD-10-CM | POA: Insufficient documentation

## 2017-12-29 DIAGNOSIS — Z7982 Long term (current) use of aspirin: Secondary | ICD-10-CM | POA: Insufficient documentation

## 2017-12-29 DIAGNOSIS — F17210 Nicotine dependence, cigarettes, uncomplicated: Secondary | ICD-10-CM | POA: Insufficient documentation

## 2017-12-31 ENCOUNTER — Encounter (HOSPITAL_COMMUNITY)
Admission: RE | Admit: 2017-12-31 | Discharge: 2017-12-31 | Disposition: A | Discharge status: Home / Self Care | Payer: BLUE CROSS/BLUE SHIELD | Source: Ambulatory Visit | Attending: Cardiology | Admitting: Cardiology

## 2017-12-31 DIAGNOSIS — I10 Essential (primary) hypertension: Secondary | ICD-10-CM | POA: Diagnosis not present

## 2017-12-31 DIAGNOSIS — Z7982 Long term (current) use of aspirin: Secondary | ICD-10-CM | POA: Diagnosis not present

## 2017-12-31 DIAGNOSIS — I2510 Atherosclerotic heart disease of native coronary artery without angina pectoris: Secondary | ICD-10-CM | POA: Diagnosis not present

## 2017-12-31 DIAGNOSIS — Z79899 Other long term (current) drug therapy: Secondary | ICD-10-CM | POA: Diagnosis not present

## 2017-12-31 DIAGNOSIS — Z955 Presence of coronary angioplasty implant and graft: Secondary | ICD-10-CM | POA: Diagnosis not present

## 2017-12-31 DIAGNOSIS — F17210 Nicotine dependence, cigarettes, uncomplicated: Secondary | ICD-10-CM | POA: Diagnosis not present

## 2017-12-31 DIAGNOSIS — I214 Non-ST elevation (NSTEMI) myocardial infarction: Secondary | ICD-10-CM

## 2017-12-31 DIAGNOSIS — E782 Mixed hyperlipidemia: Secondary | ICD-10-CM | POA: Diagnosis not present

## 2017-12-31 NOTE — Progress Notes (Signed)
Daily Session Note  Patient Details  Name: Stacy Douglas MRN: [id number] Date of Birth: 02/07/1951 Referring Provider:     [place] from 12/18/2017 in [place]  Referring Provider  Domenic Polite      Encounter Date: 12/31/2017  Check In: Session Check In - 12/31/17 1602      Check-In   Supervising physician immediately available to respond to emergencies  See telemetry face sheet for immediately available MD    Location  [facility]    Staff Present  Aundra Dubin, RN, BSN;Diane Coad, MS, EP, [facility], Exercise Physiologist;Other    Medication changes reported      No    Fall or balance concerns reported     No    Tobacco Cessation  No Change    Warm-up and Cool-down  Performed as group-led instruction    Resistance Training Performed  Yes    VAD Patient?  No    PAD/SET Patient?  No      Pain Assessment   Currently in Pain?  No/denies    Pain Score  0-No pain    Multiple Pain Sites  No       Capillary Blood Glucose: No results found for this or any previous visit (from the past 24 hour(s)).    Social History   Tobacco Use  Smoking Status Current Some Day Smoker  . Packs/day: 0.01  . Years: 53.00  . Pack years: 0.53  . Types: Cigarettes  . Start date: 08/10/1964  Smokeless Tobacco Never Used  Tobacco Comment   She is weaning herself down.     Goals Met:  Independence with exercise equipment Exercise tolerated well No report of cardiac concerns or symptoms Strength training completed today  Goals Unmet:  Not Applicable  Comments: Pt able to follow exercise prescription today without complaint.  Will continue to monitor for progression. Checked out at 4:30pm.     Dr. Kate Sable is Medical Director for [place] and Pulmonary Rehab.

## 2018-01-02 ENCOUNTER — Encounter (HOSPITAL_COMMUNITY)
Admission: RE | Admit: 2018-01-02 | Discharge: 2018-01-02 | Disposition: A | Discharge status: Home / Self Care | Payer: BLUE CROSS/BLUE SHIELD | Source: Ambulatory Visit | Attending: Cardiology | Admitting: Cardiology

## 2018-01-02 ENCOUNTER — Ambulatory Visit: Payer: BLUE CROSS/BLUE SHIELD | Admitting: Cardiology

## 2018-01-02 DIAGNOSIS — I10 Essential (primary) hypertension: Secondary | ICD-10-CM | POA: Diagnosis not present

## 2018-01-02 DIAGNOSIS — Z955 Presence of coronary angioplasty implant and graft: Secondary | ICD-10-CM

## 2018-01-02 DIAGNOSIS — Z79899 Other long term (current) drug therapy: Secondary | ICD-10-CM | POA: Diagnosis not present

## 2018-01-02 DIAGNOSIS — Z7982 Long term (current) use of aspirin: Secondary | ICD-10-CM | POA: Diagnosis not present

## 2018-01-02 DIAGNOSIS — E782 Mixed hyperlipidemia: Secondary | ICD-10-CM | POA: Diagnosis not present

## 2018-01-02 DIAGNOSIS — I214 Non-ST elevation (NSTEMI) myocardial infarction: Secondary | ICD-10-CM | POA: Diagnosis not present

## 2018-01-02 DIAGNOSIS — F17210 Nicotine dependence, cigarettes, uncomplicated: Secondary | ICD-10-CM | POA: Diagnosis not present

## 2018-01-02 DIAGNOSIS — I2510 Atherosclerotic heart disease of native coronary artery without angina pectoris: Secondary | ICD-10-CM | POA: Diagnosis not present

## 2018-01-02 NOTE — Progress Notes (Signed)
Daily Session Note  Patient Details  Name: Stacy Douglas MRN: [id number] Date of Birth: June 07, 1950 Referring Provider:     [place] from 12/18/2017 in [place]  Referring Provider  Domenic Polite      Encounter Date: 01/02/2018  Check In: Session Check In - 01/02/18 1545      Check-In   Supervising physician immediately available to respond to emergencies  See telemetry face sheet for immediately available MD    Location  [facility]    Staff Present  Russella Dar, MS, EP, [facility], Exercise Physiologist;Debra Wynetta Emery, RN, BSN;Other    Medication changes reported      No    Fall or balance concerns reported     No    Tobacco Cessation  No Change    Warm-up and Cool-down  Performed as group-led instruction    Resistance Training Performed  Yes    VAD Patient?  No    PAD/SET Patient?  No      Pain Assessment   Currently in Pain?  No/denies    Pain Score  0-No pain    Multiple Pain Sites  No       Capillary Blood Glucose: No results found for this or any previous visit (from the past 24 hour(s)).    Social History   Tobacco Use  Smoking Status Current Some Day Smoker  . Packs/day: 0.01  . Years: 53.00  . Pack years: 0.53  . Types: Cigarettes  . Start date: 08/10/1964  Smokeless Tobacco Never Used  Tobacco Comment   She is weaning herself down.     Goals Met:  Independence with exercise equipment Exercise tolerated well Personal goals reviewed No report of cardiac concerns or symptoms Strength training completed today  Goals Unmet:  Not Applicable  Comments: Pt able to follow exercise prescription today without complaint.  Will continue to monitor for progression. Check out: 4:45   Dr. Kate Sable is Medical Director for [place] and Pulmonary Rehab.

## 2018-01-05 ENCOUNTER — Encounter (HOSPITAL_COMMUNITY)
Admission: RE | Admit: 2018-01-05 | Discharge: 2018-01-05 | Disposition: A | Discharge status: Home / Self Care | Payer: BLUE CROSS/BLUE SHIELD | Source: Ambulatory Visit | Attending: Cardiology | Admitting: Cardiology

## 2018-01-05 DIAGNOSIS — F17210 Nicotine dependence, cigarettes, uncomplicated: Secondary | ICD-10-CM | POA: Diagnosis not present

## 2018-01-05 DIAGNOSIS — I214 Non-ST elevation (NSTEMI) myocardial infarction: Secondary | ICD-10-CM

## 2018-01-05 DIAGNOSIS — Z955 Presence of coronary angioplasty implant and graft: Secondary | ICD-10-CM

## 2018-01-05 DIAGNOSIS — E782 Mixed hyperlipidemia: Secondary | ICD-10-CM | POA: Diagnosis not present

## 2018-01-05 DIAGNOSIS — Z7982 Long term (current) use of aspirin: Secondary | ICD-10-CM | POA: Diagnosis not present

## 2018-01-05 DIAGNOSIS — Z79899 Other long term (current) drug therapy: Secondary | ICD-10-CM | POA: Diagnosis not present

## 2018-01-05 DIAGNOSIS — I2510 Atherosclerotic heart disease of native coronary artery without angina pectoris: Secondary | ICD-10-CM | POA: Diagnosis not present

## 2018-01-05 DIAGNOSIS — I10 Essential (primary) hypertension: Secondary | ICD-10-CM | POA: Diagnosis not present

## 2018-01-05 NOTE — Progress Notes (Signed)
Daily Session Note  Patient Details  Name: Stacy Douglas MRN: [id number] Date of Birth: 04-05-1951 Referring Provider:     [place] from 12/18/2017 in [place]  Referring Provider  Domenic Polite      Encounter Date: 01/05/2018  Check In: Session Check In - 01/05/18 1545      Check-In   Supervising physician immediately available to respond to emergencies  See telemetry face sheet for immediately available MD    Location  [facility]    Staff Present  Russella Dar, MS, EP, [facility], Exercise Physiologist;Debra Wynetta Emery, RN, BSN;Other    Medication changes reported      No    Fall or balance concerns reported     No    Tobacco Cessation  No Change    Warm-up and Cool-down  Performed as group-led instruction    Resistance Training Performed  Yes    VAD Patient?  No    PAD/SET Patient?  No      Pain Assessment   Currently in Pain?  No/denies    Pain Score  0-No pain    Multiple Pain Sites  No       Capillary Blood Glucose: No results found for this or any previous visit (from the past 24 hour(s)).    Social History   Tobacco Use  Smoking Status Current Some Day Smoker  . Packs/day: 0.01  . Years: 53.00  . Pack years: 0.53  . Types: Cigarettes  . Start date: 08/10/1964  Smokeless Tobacco Never Used  Tobacco Comment   She is weaning herself down.     Goals Met:  Independence with exercise equipment Exercise tolerated well No report of cardiac concerns or symptoms  Goals Unmet:  Not Applicable  Comments: Pt able to follow exercise prescription today without complaint.  Will continue to monitor for progression. Checked out at 4:45.   Dr. Kate Sable is Medical Director for [facility] Card[facility] and Pulmonary Rehab.

## 2018-01-07 ENCOUNTER — Encounter (HOSPITAL_COMMUNITY)
Admission: RE | Admit: 2018-01-07 | Discharge: 2018-01-07 | Disposition: A | Discharge status: Home / Self Care | Payer: BLUE CROSS/BLUE SHIELD | Source: Ambulatory Visit | Attending: Cardiology | Admitting: Cardiology

## 2018-01-07 DIAGNOSIS — Z955 Presence of coronary angioplasty implant and graft: Secondary | ICD-10-CM | POA: Diagnosis not present

## 2018-01-07 DIAGNOSIS — I2510 Atherosclerotic heart disease of native coronary artery without angina pectoris: Secondary | ICD-10-CM | POA: Diagnosis not present

## 2018-01-07 DIAGNOSIS — I214 Non-ST elevation (NSTEMI) myocardial infarction: Secondary | ICD-10-CM

## 2018-01-07 DIAGNOSIS — I10 Essential (primary) hypertension: Secondary | ICD-10-CM | POA: Diagnosis not present

## 2018-01-07 DIAGNOSIS — E782 Mixed hyperlipidemia: Secondary | ICD-10-CM | POA: Diagnosis not present

## 2018-01-07 DIAGNOSIS — Z7982 Long term (current) use of aspirin: Secondary | ICD-10-CM | POA: Diagnosis not present

## 2018-01-07 DIAGNOSIS — F17210 Nicotine dependence, cigarettes, uncomplicated: Secondary | ICD-10-CM | POA: Diagnosis not present

## 2018-01-07 DIAGNOSIS — Z79899 Other long term (current) drug therapy: Secondary | ICD-10-CM | POA: Diagnosis not present

## 2018-01-07 NOTE — Progress Notes (Signed)
Daily Session Note  Patient Details  Name: Stacy Douglas MRN: [id number] Date of Birth: 1951-04-27 Referring Provider:     [place] from 12/18/2017 in [place]  Referring Provider  Domenic Polite      Encounter Date: 01/07/2018  Check In: Session Check In - 01/07/18 1542      Check-In   Supervising physician immediately available to respond to emergencies  See telemetry face sheet for immediately available MD    Location  [facility]    Staff Present  Russella Dar, MS, EP, [facility], Exercise Physiologist;Perlie Stene Wynetta Emery, RN, [facility], Exercise Physiologist    Medication changes reported      No    Fall or balance concerns reported     No    Warm-up and Cool-down  Performed as group-led instruction    Resistance Training Performed  Yes    VAD Patient?  No    PAD/SET Patient?  No      Pain Assessment   Currently in Pain?  No/denies    Pain Score  0-No pain    Multiple Pain Sites  No       Capillary Blood Glucose: No results found for this or any previous visit (from the past 24 hour(s)).    Social History   Tobacco Use  Smoking Status Current Some Day Smoker  . Packs/day: 0.01  . Years: 53.00  . Pack years: 0.53  . Types: Cigarettes  . Start date: 08/10/1964  Smokeless Tobacco Never Used  Tobacco Comment   She is weaning herself down.     Goals Met:  Independence with exercise equipment Exercise tolerated well No report of cardiac concerns or symptoms Strength training completed today  Goals Unmet:  Not Applicable  Comments: Pt able to follow exercise prescription today without complaint.  Will continue to monitor for progression. Check out 1645.   Dr. Kate Sable is Medical Director for [facility] Cardiac and Pulmonary Rehab.

## 2018-01-09 ENCOUNTER — Encounter (HOSPITAL_COMMUNITY)
Admission: RE | Admit: 2018-01-09 | Discharge: 2018-01-09 | Disposition: A | Discharge status: Home / Self Care | Payer: BLUE CROSS/BLUE SHIELD | Source: Ambulatory Visit | Attending: Cardiology | Admitting: Cardiology

## 2018-01-09 DIAGNOSIS — F17210 Nicotine dependence, cigarettes, uncomplicated: Secondary | ICD-10-CM | POA: Diagnosis not present

## 2018-01-09 DIAGNOSIS — I2510 Atherosclerotic heart disease of native coronary artery without angina pectoris: Secondary | ICD-10-CM | POA: Diagnosis not present

## 2018-01-09 DIAGNOSIS — E782 Mixed hyperlipidemia: Secondary | ICD-10-CM | POA: Diagnosis not present

## 2018-01-09 DIAGNOSIS — Z79899 Other long term (current) drug therapy: Secondary | ICD-10-CM | POA: Diagnosis not present

## 2018-01-09 DIAGNOSIS — I214 Non-ST elevation (NSTEMI) myocardial infarction: Secondary | ICD-10-CM | POA: Diagnosis not present

## 2018-01-09 DIAGNOSIS — Z955 Presence of coronary angioplasty implant and graft: Secondary | ICD-10-CM

## 2018-01-09 DIAGNOSIS — I10 Essential (primary) hypertension: Secondary | ICD-10-CM | POA: Diagnosis not present

## 2018-01-09 DIAGNOSIS — Z7982 Long term (current) use of aspirin: Secondary | ICD-10-CM | POA: Diagnosis not present

## 2018-01-09 NOTE — Progress Notes (Signed)
Daily Session Note  Patient Details  Name: Stacy Douglas MRN: [id number] Date of Birth: Mar 30, 1951 Referring Provider:     [place] from 12/18/2017 in [place]  Referring Provider  Domenic Polite      Encounter Date: 01/09/2018  Check In: Session Check In - 01/09/18 1545      Check-In   Supervising physician immediately available to respond to emergencies  See telemetry face sheet for immediately available MD    Location  [facility]    Staff Present  Russella Dar, MS, EP, [facility], Exercise Physiologist;Debra Wynetta Emery, RN, [facility], Exercise Physiologist    Medication changes reported      No    Fall or balance concerns reported     No    Tobacco Cessation  No Change    Warm-up and Cool-down  Performed as group-led instruction    Resistance Training Performed  Yes    VAD Patient?  No    PAD/SET Patient?  No      Pain Assessment   Currently in Pain?  No/denies    Pain Score  0-No pain    Multiple Pain Sites  No       Capillary Blood Glucose: No results found for this or any previous visit (from the past 24 hour(s)).    Social History   Tobacco Use  Smoking Status Current Some Day Smoker  . Packs/day: 0.01  . Years: 53.00  . Pack years: 0.53  . Types: Cigarettes  . Start date: 08/10/1964  Smokeless Tobacco Never Used  Tobacco Comment   She is weaning herself down.     Goals Met:  Independence with exercise equipment Exercise tolerated well Personal goals reviewed No report of cardiac concerns or symptoms Strength training completed today  Goals Unmet:  Not Applicable  Comments: Pt able to follow exercise prescription today without complaint.  Will continue to monitor for progression. Check out: 4:45   Dr. Kate Sable is Medical Director for [place] and Pulmonary Rehab.

## 2018-01-12 ENCOUNTER — Encounter (HOSPITAL_COMMUNITY): Payer: BLUE CROSS/BLUE SHIELD

## 2018-01-12 NOTE — Progress Notes (Signed)
Cardiac Individual Treatment Plan  Patient Details  Name: Stacy Douglas MRN: [id number] Date of Birth: 1950/06/09 Referring Provider:     [facility] from 12/18/2017 in [place]  Referring Provider  Domenic Polite      Initial Encounter Date:    CARDIAC REHAB PHASE II ORIENTATION from 12/18/2017 in [place]  Date  12/18/17      Visit Diagnosis: NSTEMI (non-ST elevated myocardial infarction) [facility])  Status post coronary artery stent placement  Patient's Home Medications on Admission:  Current Outpatient Medications:  .  aspirin EC 81 MG tablet, Take 81 mg by mouth daily., Disp: , Rfl:  .  metoprolol succinate (TOPROL XL) 25 MG 24 hr tablet, Take 0.5 tablets (12.5 mg total) by mouth daily., Disp: 45 tablet, Rfl: 3 .  nitroGLYCERIN (NITROSTAT) 0.4 MG SL tablet, Place 1 tablet (0.4 mg total) under the tongue every 5 (five) minutes x 3 doses as needed., Disp: 25 tablet, Rfl: 3 .  prasugrel (EFFIENT) 10 MG TABS tablet, Take 1 tablet (10 mg total) by mouth daily., Disp: 30 tablet, Rfl: 3  Past Medical History: Past Medical History:  Diagnosis Date  . Coronary atherosclerosis of native coronary artery    a. VF arrest in [place] s/p BMS LAD 5/11, PTCA circumflex 6/11, LVEF 40%. b. [place] 10/2014 s/p DES to prox and mid RCA, otherwise patent LAD stent and moderate disease in the Cx, LVEF 55-65%. c. low-risk NST in 06/2017 d. 09/2017: NSTEMI with cath showing 99% mid-LCx stenosis --> treated with DES; patent stents along LAD and RCA.   Marland Kitchen DVT (deep venous thrombosis) (HCC) 1992   LLE  . Essential hypertension   . History of duodenal ulcer 1960s  . Mixed hyperlipidemia   . Myocardial infarction ([place]) 41/9622   complicated by VF arrest  . NSTEMI (non-ST elevated myocardial infarction) ([place]) 10/07/2017   Archie Endo 10/07/2017  . Obesity   . Plavix resistance   . Tobacco abuse     Tobacco Use: Social History   Tobacco Use   Smoking Status Current Some Day Smoker  . Packs/day: 0.01  . Years: 53.00  . Pack years: 0.53  . Types: Cigarettes  . Start date: 08/10/1964  Smokeless Tobacco Never Used  Tobacco Comment   She is weaning herself down.     Labs: Recent Review Flowsheet Data    Labs for ITP Cardiac and Pulmonary Rehab Latest Ref Rng & Units 11/16/2014 03/04/2015 07/31/2016 10/08/2017 12/18/2017   Cholestrol <200 mg/dL 152 149 158 154 149   LDLCALC mg/dL (calc) 95 95 99 94 85   HDL >50 mg/dL 34(L) 41(L) 39(L) 37(L) 45(L)   Trlycerides <150 mg/dL 113 64 100 117 91   Hemoglobin A1c 4.8 - 5.6 % - - - - -      Capillary Blood Glucose: No results found for: GLUCAP   Exercise Target Goals: Exercise Program Goal: Individual exercise prescription set using results from initial 6 min walk test and THRR while considering  patient's activity barriers and safety.   Exercise Prescription Goal: Starting with aerobic activity 30 plus minutes a day, 3 days per week for initial exercise prescription. Provide home exercise prescription and guidelines that participant acknowledges understanding prior to discharge.  Activity Barriers & Risk Stratification: Activity Barriers & Cardiac Risk Stratification - 12/18/17 1609      Activity Barriers & Cardiac Risk Stratification   Activity Barriers  None    Cardiac Risk Stratification  High  6 Minute Walk: 6 Minute Walk    Row Name 12/18/17 1606         6 Minute Walk   Phase  Initial     Distance  1450 feet     Walk Time  6 minutes     # of Rest Breaks  0     MPH  2.75     METS  3.1     RPE  13     Perceived Dyspnea   9     VO2 Peak  10.67     Symptoms  Yes (comment)     Comments  Had a little discomfort in (R) upper chest. Went away after 14mnute rest.     Resting HR  81 bpm     Resting BP  114/60     Resting Oxygen Saturation   98 %     Exercise Oxygen Saturation  during 6 min walk  90 %     Max Ex. HR  96 bpm     Max Ex. BP  140/80     2 Minute  Post BP  118/70        Oxygen Initial Assessment:   Oxygen Re-Evaluation:   Oxygen Discharge (Final Oxygen Re-Evaluation):   Initial Exercise Prescription: Initial Exercise Prescription - 12/18/17 1600      Date of Initial Exercise RX and Referring Provider   Date  12/18/17    Referring Provider  MDomenic Polite   Expected Discharge Date  03/20/18      Treadmill   MPH  1.7    Grade  0    Minutes  17    METs  2.3      Recumbant Elliptical   Level  1    RPM  43    Watts  46    Minutes  22    METs  2.7      Prescription Details   Frequency (times per week)  3    Duration  Progress to 30 minutes of continuous aerobic without signs/symptoms of physical distress      Intensity   THRR 40-80% of Max Heartrate  111-125-140    Ratings of Perceived Exertion  11-13    Perceived Dyspnea  0-4      Progression   Progression  Continue progressive overload as per policy without signs/symptoms or physical distress.      Resistance Training   Training Prescription  Yes    Weight  1    Reps  10-15       Perform Capillary Blood Glucose checks as needed.  Exercise Prescription Changes:  Exercise Prescription Changes    Row Name 01/01/18 1500             Response to Exercise   Blood Pressure (Admit)  122/70       Blood Pressure (Exercise)  142/68       Blood Pressure (Exit)  110/60       Heart Rate (Admit)  82 bpm       Heart Rate (Exercise)  109 bpm       Heart Rate (Exit)  91 bpm       Rating of Perceived Exertion (Exercise)  12       Duration  Progress to 30 minutes of  aerobic without signs/symptoms of physical distress       Intensity  THRR New 111-125-140         Progression   Progression  Continue to  progress workloads to maintain intensity without signs/symptoms of physical distress.       Average METs  3.08         Resistance Training   Training Prescription  Yes       Weight  1       Reps  10-15         Treadmill   MPH  1.8       Grade  0        Minutes  17       METs  2.37         Recumbant Elliptical   Level  1       RPM  60       Watts  76       Minutes  22       METs  2.7          Exercise Comments:  Exercise Comments    Row Name 01/01/18 1543 01/12/18 1325         Exercise Comments  Patient has been doing well so far in rehab. She has only had 5 visits to date. She has already shown to increase her MET level on the equipment as tolerated with progression.   Patient is doing well in the program thus far. She has tolerated the equipment and weight increases.          Exercise Goals and Review:  Exercise Goals    Row Name 12/18/17 1614             Exercise Goals   Able to understand and use rate of perceived exertion (RPE) scale  Yes       Intervention  Provide education and explanation on how to use RPE scale       Expected Outcomes  Short Term: Able to use RPE daily in rehab to express subjective intensity level;Long Term:  Able to use RPE to guide intensity level when exercising independently       Knowledge and understanding of Target Heart Rate Range (THRR)  Yes       Intervention  Provide education and explanation of THRR including how the numbers were predicted and where they are located for reference       Expected Outcomes  Short Term: Able to use daily as guideline for intensity in rehab;Long Term: Able to use THRR to govern intensity when exercising independently       Able to check pulse independently  Yes       Intervention  Provide education and demonstration on how to check pulse in carotid and radial arteries.       Expected Outcomes  Short Term: Able to explain why pulse checking is important during independent exercise;Long Term: Able to check pulse independently and accurately       Understanding of Exercise Prescription  Yes       Intervention  Provide education, explanation, and written materials on patient's individual exercise prescription       Expected Outcomes  Short Term: Able to  explain program exercise prescription;Long Term: Able to explain home exercise prescription to exercise independently          Exercise Goals Re-Evaluation : Exercise Goals Re-Evaluation    Row Name 01/01/18 1539 01/12/18 1323           Exercise Goal Re-Evaluation   Exercise Goals Review  Increase Physical Activity;Understanding of Exercise Prescription  Increase Physical Activity;Increase Strength and Stamina;Understanding of Exercise Prescription  Comments  Patient's most prioritized goal is to lose weight, she'd like to lose 40 pounds in a year. She has been working hard in rehab to increase her activity level which will aid in her weight loss.   Patient's most prioritized goal is to lose weight, she'd like to lose 40 pounds in a year. She has been working hard in rehab to increase her activity level which will aid in her weight loss.       Expected Outcomes  Patient will lose weight.   Patient ultimate goal is to lose weight.           Discharge Exercise Prescription (Final Exercise Prescription Changes): Exercise Prescription Changes - 01/01/18 1500      Response to Exercise   Blood Pressure (Admit)  122/70    Blood Pressure (Exercise)  142/68    Blood Pressure (Exit)  110/60    Heart Rate (Admit)  82 bpm    Heart Rate (Exercise)  109 bpm    Heart Rate (Exit)  91 bpm    Rating of Perceived Exertion (Exercise)  12    Duration  Progress to 30 minutes of  aerobic without signs/symptoms of physical distress    Intensity  THRR New   111-125-140     Progression   Progression  Continue to progress workloads to maintain intensity without signs/symptoms of physical distress.    Average METs  3.08      Resistance Training   Training Prescription  Yes    Weight  1    Reps  10-15      Treadmill   MPH  1.8    Grade  0    Minutes  17    METs  2.37      Recumbant Elliptical   Level  1    RPM  60    Watts  76    Minutes  22    METs  2.7       Nutrition:  Target  Goals: Understanding of nutrition guidelines, daily intake of sodium <1538m, cholesterol <2041m calories 30% from fat and 7% or less from saturated fats, daily to have 5 or more servings of fruits and vegetables.  Biometrics: Pre Biometrics - 12/18/17 1616      Pre Biometrics   Height  '5\' 4"'  (1.626 m)    Weight  90.4 kg    Waist Circumference  42.5 inches    Hip Circumference  49 inches    Waist to Hip Ratio  0.87 %    BMI (Calculated)  34.21    Triceps Skinfold  23 mm    % Body Fat  43.9 %    Grip Strength  5.71 kg    Flexibility  12.8 in    Single Leg Stand  4 seconds        Nutrition Therapy Plan and Nutrition Goals: Nutrition Therapy & Goals - 01/12/18 1413      Nutrition Therapy   RD appointment deferred  Yes      Personal Nutrition Goals   Personal Goal #2  Patient continues to say she is eating a heart healthy diet currently.     Additional Goals?  No    Comments  Patient not able to make RD appointment due to work schedule.        Nutrition Assessments: Nutrition Assessments - 12/18/17 1629      MEDFICTS Scores   Pre Score  37       Nutrition Goals  Re-Evaluation:   Nutrition Goals Discharge (Final Nutrition Goals Re-Evaluation):   Psychosocial: Target Goals: Acknowledge presence or absence of significant depression and/or stress, maximize coping skills, provide positive support system. Participant is able to verbalize types and ability to use techniques and skills needed for reducing stress and depression.  Initial Review & Psychosocial Screening: Initial Psych Review & Screening - 12/18/17 1625      Initial Review   Current issues with  None Identified      Family Dynamics   Good Support System?  Yes      Barriers   Psychosocial barriers to participate in program  There are no identifiable barriers or psychosocial needs.      Screening Interventions   Interventions  Encouraged to exercise    Expected Outcomes  Short Term goal: Identification  and review with participant of any Quality of Life or Depression concerns found by scoring the questionnaire.;Long Term goal: The participant improves quality of Life and PHQ9 Scores as seen by post scores and/or verbalization of changes       Quality of Life Scores: Quality of Life - 12/18/17 1628      Quality of Life   Select  Quality of Life      Quality of Life Scores   Health/Function Pre  23.73 %    Socioeconomic Pre  24.36 %    Psych/Spiritual Pre  23.14 %    Family Pre  25.5 %    GLOBAL Pre  23.97 %      Scores of 19 and below usually indicate a poorer quality of life in these areas.  A difference of  2-3 points is a clinically meaningful difference.  A difference of 2-3 points in the total score of the Quality of Life Index has been associated with significant improvement in overall quality of life, self-image, physical symptoms, and general health in studies assessing change in quality of life.  PHQ-9: Recent Review Flowsheet Data    Depression screen [facility] 2/9 08/12/2016 05/30/2015 12/14/2014 12/14/2014 11/02/2013   Decreased Interest 0 0 0 0 0   Down, Depressed, Hopeless 0 0 0 0 0   PHQ - 2 Score 0 0 0 0 0   Altered sleeping - - 1 - -   Tired, decreased energy - - 1 - -   PHQ-9 Score - - 2 - -     Interpretation of Total Score  Total Score Depression Severity:  1-4 = Minimal depression, 5-9 = Mild depression, 10-14 = Moderate depression, 15-19 = Moderately severe depression, 20-27 = Severe depression   Psychosocial Evaluation and Intervention: Psychosocial Evaluation - 12/18/17 1627      Psychosocial Evaluation & Interventions   Interventions  Encouraged to exercise with the program and follow exercise prescription    Continue Psychosocial Services   No Follow up required       Psychosocial Re-Evaluation: Psychosocial Re-Evaluation    [place] Name 01/12/18 1418             Psychosocial Re-Evaluation   Current issues with  None Identified       Comments  Patient's  initial QOL score was 23.97 and her PHQ-9 score was 2 with no psychosocial issues identified.        Expected Outcomes  Patient will have no psychosocial issues identified at discharge.        Interventions  Relaxation education;Encouraged to attend Cardiac Rehabilitation for the exercise;Stress management education       Continue Psychosocial Services  No Follow up required          Psychosocial Discharge (Final Psychosocial Re-Evaluation): Psychosocial Re-Evaluation - 01/12/18 1418      Psychosocial Re-Evaluation   Current issues with  None Identified    Comments  Patient's initial QOL score was 23.97 and her PHQ-9 score was 2 with no psychosocial issues identified.     Expected Outcomes  Patient will have no psychosocial issues identified at discharge.     Interventions  Relaxation education;Encouraged to attend Cardiac Rehabilitation for the exercise;Stress management education    Continue Psychosocial Services   No Follow up required       Vocational Rehabilitation: Provide vocational rehab assistance to qualifying candidates.   Vocational Rehab Evaluation & Intervention: Vocational Rehab - 12/18/17 1631      Initial Vocational Rehab Evaluation & Intervention   Assessment shows need for Vocational Rehabilitation  No       Education: Education Goals: Education classes will be provided on a weekly basis, covering required topics. Participant will state understanding/return demonstration of topics presented.  Learning Barriers/Preferences: Learning Barriers/Preferences - 12/18/17 1630      Learning Barriers/Preferences   Learning Barriers  None    Learning Preferences  Pictoral;Computer/Internet;Video;Written Material       Education Topics: Hypertension, Hypertension Reduction -Define heart disease and high blood pressure. Discus how high blood pressure affects the body and ways to reduce high blood pressure.   Exercise and Your Heart -Discuss why it is important  to exercise, the FITT principles of exercise, normal and abnormal responses to exercise, and how to exercise safely.   Angina -Discuss definition of angina, causes of angina, treatment of angina, and how to decrease risk of having angina.   Cardiac Medications -Review what the following cardiac medications are used for, how they affect the body, and side effects that may occur when taking the medications.  Medications include Aspirin, Beta blockers, calcium channel blockers, ACE Inhibitors, angiotensin receptor blockers, diuretics, digoxin, and antihyperlipidemics.   CARDIAC REHAB PHASE II EXERCISE from 01/07/2018 in [place]  Date  12/24/17  Educator  DJ  Instruction Review Code  2- Demonstrated Understanding      Congestive Heart Failure -Discuss the definition of CHF, how to live with CHF, the signs and symptoms of CHF, and how keep track of weight and sodium intake.   CARDIAC REHAB PHASE II EXERCISE from 01/07/2018 in [place]  Date  12/31/17  Educator  D. Coad  Instruction Review Code  2- Demonstrated Understanding      Heart Disease and Intimacy -Discus the effect sexual activity has on the heart, how changes occur during intimacy as we age, and safety during sexual activity.   CARDIAC REHAB PHASE II EXERCISE from 01/07/2018 in [place]  Date  01/07/18  Educator  Etheleen Mayhew  Instruction Review Code  2- Demonstrated Understanding      Smoking Cessation / COPD -Discuss different methods to quit smoking, the health benefits of quitting smoking, and the definition of COPD.   Nutrition I: Fats -Discuss the types of cholesterol, what cholesterol does to the heart, and how cholesterol levels can be controlled.   Nutrition II: Labels -Discuss the different components of food labels and how to read food label   Heart Parts/Heart Disease and PAD -Discuss the anatomy of the heart, the pathway of blood  circulation through the heart, and these are affected by heart disease.   Stress I: Signs and Symptoms -  Discuss the causes of stress, how stress may lead to anxiety and depression, and ways to limit stress.   Stress II: Relaxation -Discuss different types of relaxation techniques to limit stress.   Warning Signs of Stroke / TIA -Discuss definition of a stroke, what the signs and symptoms are of a stroke, and how to identify when someone is having stroke.   Knowledge Questionnaire Score: Knowledge Questionnaire Score - 12/18/17 1630      Knowledge Questionnaire Score   Pre Score  26/28       Core Components/Risk Factors/Patient Goals at Admission: Personal Goals and Risk Factors at Admission - 12/18/17 1631      Core Components/Risk Factors/Patient Goals on Admission    Weight Management  Yes    Intervention  Weight Management/Obesity: Establish reasonable short term and long term weight goals.    Admit Weight  199 lb 6.4 oz (90.4 kg)    Goal Weight: Short Term  194 lb 6.4 oz (88.2 kg)    Goal Weight: Long Term  189 lb 6.4 oz (85.9 kg)    Expected Outcomes  Short Term: Continue to assess and modify interventions until short term weight is achieved;Long Term: Adherence to nutrition and physical activity/exercise program aimed toward attainment of established weight goal    Personal Goal Other  Yes    Personal Goal  Patient would like to lose on short-term 3lbs month while in program and on a long term basis lose 40lbs overall.    Intervention  Attend CR 3 x week and supplement with 2 x week exercise at home.    Expected Outcomes  Achieve short-term and long-term goals.        Core Components/Risk Factors/Patient Goals Review:  Goals and Risk Factor Review    Row Name 01/12/18 1414             Core Components/Risk Factors/Patient Goals Review   Personal Goals Review  Weight Management/Obesity Lose weight 3 lbs/month; 40 in one year.        Review  Patient has completed  9 sessions gaining 3 lbs since her initial visit. She says she is trying to eat heart healthy. She continues to try and quit smoking on her own and is down to 3 cigarettes/day. She is doing well in the program with progression. She says she feels a little stronger and has returned to work. She is pleased with her progress so far and hopes to improve more as she continues.        Expected Outcomes  Patient will continue to attend sessions and complete the program and meet her personal goals.           Core Components/Risk Factors/Patient Goals at Discharge (Final Review):  Goals and Risk Factor Review - 01/12/18 1414      Core Components/Risk Factors/Patient Goals Review   Personal Goals Review  Weight Management/Obesity   Lose weight 3 lbs/month; 40 in one year.    Review  Patient has completed 9 sessions gaining 3 lbs since her initial visit. She says she is trying to eat heart healthy. She continues to try and quit smoking on her own and is down to 3 cigarettes/day. She is doing well in the program with progression. She says she feels a little stronger and has returned to work. She is pleased with her progress so far and hopes to improve more as she continues.     Expected Outcomes  Patient will continue to attend sessions and complete  the program and meet her personal goals.        ITP Comments: ITP Comments    Row Name 12/22/17 1405           ITP Comments  Patient new to program. Plans to start Monday 12/22/17.          Comments: ITP 30 Day REVIEW Patient doing well in the program. Will continue to monitor for progress.

## 2018-01-14 ENCOUNTER — Encounter (HOSPITAL_COMMUNITY)
Admission: RE | Admit: 2018-01-14 | Discharge: 2018-01-14 | Disposition: A | Discharge status: Home / Self Care | Payer: BLUE CROSS/BLUE SHIELD | Source: Ambulatory Visit | Attending: Cardiology | Admitting: Cardiology

## 2018-01-14 DIAGNOSIS — Z955 Presence of coronary angioplasty implant and graft: Secondary | ICD-10-CM

## 2018-01-14 DIAGNOSIS — F17210 Nicotine dependence, cigarettes, uncomplicated: Secondary | ICD-10-CM | POA: Diagnosis not present

## 2018-01-14 DIAGNOSIS — I2510 Atherosclerotic heart disease of native coronary artery without angina pectoris: Secondary | ICD-10-CM | POA: Diagnosis not present

## 2018-01-14 DIAGNOSIS — Z7982 Long term (current) use of aspirin: Secondary | ICD-10-CM | POA: Diagnosis not present

## 2018-01-14 DIAGNOSIS — Z79899 Other long term (current) drug therapy: Secondary | ICD-10-CM | POA: Diagnosis not present

## 2018-01-14 DIAGNOSIS — I214 Non-ST elevation (NSTEMI) myocardial infarction: Secondary | ICD-10-CM | POA: Diagnosis not present

## 2018-01-14 DIAGNOSIS — I10 Essential (primary) hypertension: Secondary | ICD-10-CM | POA: Diagnosis not present

## 2018-01-14 DIAGNOSIS — E782 Mixed hyperlipidemia: Secondary | ICD-10-CM | POA: Diagnosis not present

## 2018-01-14 NOTE — Progress Notes (Signed)
Daily Session Note  Patient Details  Name: Stacy Douglas MRN: [id number] Date of Birth: 08-02-1950 Referring Provider:     [place] from 12/18/2017 in [place]  Referring Provider  Domenic Polite      Encounter Date: 01/14/2018  Check In: Session Check In - 01/14/18 1545      Check-In   Supervising physician immediately available to respond to emergencies  See telemetry face sheet for immediately available MD    Location  [facility]    Staff Present  Russella Dar, MS, EP, [facility], Exercise Physiologist;Debra Wynetta Emery, RN, Cory Munch, Exercise Physiologist    Medication changes reported      No    Fall or balance concerns reported     No    Tobacco Cessation  No Change    Warm-up and Cool-down  Performed as group-led instruction    Resistance Training Performed  Yes    VAD Patient?  No    PAD/SET Patient?  No      Pain Assessment   Currently in Pain?  No/denies    Pain Score  0-No pain    Multiple Pain Sites  No       Capillary Blood Glucose: No results found for this or any previous visit (from the past 24 hour(s)).    Social History   Tobacco Use  Smoking Status Current Some Day Smoker  . Packs/day: 0.01  . Years: 53.00  . Pack years: 0.53  . Types: Cigarettes  . Start date: 08/10/1964  Smokeless Tobacco Never Used  Tobacco Comment   She is weaning herself down.     Goals Met:  Independence with exercise equipment Exercise tolerated well No report of cardiac concerns or symptoms Strength training completed today  Goals Unmet:  Not Applicable  Comments: Pt able to follow exercise prescription today without complaint.  Will continue to monitor for progression. Check out 4:45.    Dr. Kate Sable is Medical Director for [facility] Cardiac and Pulmonary Rehab.

## 2018-01-16 ENCOUNTER — Encounter (HOSPITAL_COMMUNITY)
Admission: RE | Admit: 2018-01-16 | Discharge: 2018-01-16 | Disposition: A | Discharge status: Home / Self Care | Payer: BLUE CROSS/BLUE SHIELD | Source: Ambulatory Visit | Attending: Cardiology | Admitting: Cardiology

## 2018-01-16 DIAGNOSIS — I10 Essential (primary) hypertension: Secondary | ICD-10-CM | POA: Diagnosis not present

## 2018-01-16 DIAGNOSIS — I214 Non-ST elevation (NSTEMI) myocardial infarction: Secondary | ICD-10-CM

## 2018-01-16 DIAGNOSIS — I2510 Atherosclerotic heart disease of native coronary artery without angina pectoris: Secondary | ICD-10-CM | POA: Diagnosis not present

## 2018-01-16 DIAGNOSIS — Z955 Presence of coronary angioplasty implant and graft: Secondary | ICD-10-CM | POA: Diagnosis not present

## 2018-01-16 DIAGNOSIS — E782 Mixed hyperlipidemia: Secondary | ICD-10-CM | POA: Diagnosis not present

## 2018-01-16 DIAGNOSIS — Z7982 Long term (current) use of aspirin: Secondary | ICD-10-CM | POA: Diagnosis not present

## 2018-01-16 DIAGNOSIS — Z79899 Other long term (current) drug therapy: Secondary | ICD-10-CM | POA: Diagnosis not present

## 2018-01-16 DIAGNOSIS — F17210 Nicotine dependence, cigarettes, uncomplicated: Secondary | ICD-10-CM | POA: Diagnosis not present

## 2018-01-16 NOTE — Progress Notes (Signed)
Daily Session Note  Patient Details  Name: Stacy Douglas MRN: [id number] Date of Birth: May 14, 1950 Referring Provider:     [place] from 12/18/2017 in [place]  Referring Provider  Domenic Polite      Encounter Date: 01/16/2018  Check In: Session Check In - 01/16/18 1545      Check-In   Supervising physician immediately available to respond to emergencies  See telemetry face sheet for immediately available MD    Location  [facility]    Staff Present  Russella Dar, MS, EP, [facility], Exercise Physiologist;Debra Wynetta Emery, RN, Cory Munch, Exercise Physiologist    Medication changes reported      No    Fall or balance concerns reported     No    Tobacco Cessation  No Change    Warm-up and Cool-down  Performed as group-led instruction    Resistance Training Performed  Yes    VAD Patient?  No    PAD/SET Patient?  No      Pain Assessment   Currently in Pain?  No/denies    Pain Score  0-No pain    Multiple Pain Sites  No       Capillary Blood Glucose: No results found for this or any previous visit (from the past 24 hour(s)).    Social History   Tobacco Use  Smoking Status Current Some Day Smoker  . Packs/day: 0.01  . Years: 53.00  . Pack years: 0.53  . Types: Cigarettes  . Start date: 08/10/1964  Smokeless Tobacco Never Used  Tobacco Comment   She is weaning herself down.     Goals Met:  Independence with exercise equipment Exercise tolerated well No report of cardiac concerns or symptoms Strength training completed today  Goals Unmet:  Not Applicable  Comments: Pt able to follow exercise prescription today without complaint.  Will continue to monitor for progression. Check out 4:45.   Dr. Kate Sable is Medical Director for [facility] and Pulmonary Rehab.

## 2018-01-19 ENCOUNTER — Encounter (HOSPITAL_COMMUNITY): Payer: BLUE CROSS/BLUE SHIELD

## 2018-01-21 ENCOUNTER — Encounter (HOSPITAL_COMMUNITY)
Admission: RE | Admit: 2018-01-21 | Discharge: 2018-01-21 | Disposition: A | Discharge status: Home / Self Care | Payer: BLUE CROSS/BLUE SHIELD | Source: Ambulatory Visit | Attending: Cardiology | Admitting: Cardiology

## 2018-01-21 DIAGNOSIS — Z955 Presence of coronary angioplasty implant and graft: Secondary | ICD-10-CM

## 2018-01-21 DIAGNOSIS — F17210 Nicotine dependence, cigarettes, uncomplicated: Secondary | ICD-10-CM | POA: Diagnosis not present

## 2018-01-21 DIAGNOSIS — I10 Essential (primary) hypertension: Secondary | ICD-10-CM | POA: Diagnosis not present

## 2018-01-21 DIAGNOSIS — I214 Non-ST elevation (NSTEMI) myocardial infarction: Secondary | ICD-10-CM

## 2018-01-21 DIAGNOSIS — Z79899 Other long term (current) drug therapy: Secondary | ICD-10-CM | POA: Diagnosis not present

## 2018-01-21 DIAGNOSIS — Z7982 Long term (current) use of aspirin: Secondary | ICD-10-CM | POA: Diagnosis not present

## 2018-01-21 DIAGNOSIS — I2510 Atherosclerotic heart disease of native coronary artery without angina pectoris: Secondary | ICD-10-CM | POA: Diagnosis not present

## 2018-01-21 DIAGNOSIS — E782 Mixed hyperlipidemia: Secondary | ICD-10-CM | POA: Diagnosis not present

## 2018-01-21 NOTE — Progress Notes (Signed)
Daily Session Note  Patient Details  Name: Stacy Douglas MRN: [id number] Date of Birth: 03-11-1951 Referring Provider:     [place] from 12/18/2017 in [place]  Referring Provider  Domenic Polite      Encounter Date: 01/21/2018  Check In: Session Check In - 01/21/18 1542      Check-In   Supervising physician immediately available to respond to emergencies  See telemetry face sheet for immediately available MD    Location  [facility]    Staff Present  Russella Dar, MS, EP, [facility], Exercise Physiologist;Dura Mccormack Wynetta Emery, RN, Cory Munch, Exercise Physiologist    Medication changes reported      No    Fall or balance concerns reported     No    Tobacco Cessation  No Change    Warm-up and Cool-down  Performed as group-led instruction    Resistance Training Performed  Yes    VAD Patient?  No    PAD/SET Patient?  No      Pain Assessment   Currently in Pain?  No/denies    Pain Score  0-No pain    Multiple Pain Sites  No       Capillary Blood Glucose: No results found for this or any previous visit (from the past 24 hour(s)).    Social History   Tobacco Use  Smoking Status Current Some Day Smoker  . Packs/day: 0.01  . Years: 53.00  . Pack years: 0.53  . Types: Cigarettes  . Start date: 08/10/1964  Smokeless Tobacco Never Used  Tobacco Comment   She is weaning herself down.     Goals Met:  Independence with exercise equipment Exercise tolerated well No report of cardiac concerns or symptoms Strength training completed today  Goals Unmet:  Not Applicable  Comments: Pt able to follow exercise prescription today without complaint.  Will continue to monitor for progression. Check out 1645.   Dr. Kate Sable is Medical Director for [facility] Cardiac and Pulmonary Rehab.

## 2018-01-23 ENCOUNTER — Encounter (HOSPITAL_COMMUNITY)
Admission: RE | Admit: 2018-01-23 | Discharge: 2018-01-23 | Disposition: A | Discharge status: Home / Self Care | Payer: BLUE CROSS/BLUE SHIELD | Source: Ambulatory Visit | Attending: Cardiology | Admitting: Cardiology

## 2018-01-23 DIAGNOSIS — Z955 Presence of coronary angioplasty implant and graft: Secondary | ICD-10-CM | POA: Diagnosis not present

## 2018-01-23 DIAGNOSIS — I10 Essential (primary) hypertension: Secondary | ICD-10-CM | POA: Diagnosis not present

## 2018-01-23 DIAGNOSIS — Z79899 Other long term (current) drug therapy: Secondary | ICD-10-CM | POA: Diagnosis not present

## 2018-01-23 DIAGNOSIS — I2510 Atherosclerotic heart disease of native coronary artery without angina pectoris: Secondary | ICD-10-CM | POA: Diagnosis not present

## 2018-01-23 DIAGNOSIS — E782 Mixed hyperlipidemia: Secondary | ICD-10-CM | POA: Diagnosis not present

## 2018-01-23 DIAGNOSIS — I214 Non-ST elevation (NSTEMI) myocardial infarction: Secondary | ICD-10-CM

## 2018-01-23 DIAGNOSIS — F17210 Nicotine dependence, cigarettes, uncomplicated: Secondary | ICD-10-CM | POA: Diagnosis not present

## 2018-01-23 DIAGNOSIS — Z7982 Long term (current) use of aspirin: Secondary | ICD-10-CM | POA: Diagnosis not present

## 2018-01-23 NOTE — Progress Notes (Signed)
Daily Session Note  Patient Details  Name: Stacy Douglas MRN: [id number] Date of Birth: 1950-05-26 Referring Provider:     [place] from 12/18/2017 in [place]  Referring Provider  Domenic Polite      Encounter Date: 01/23/2018  Check In: Session Check In - 01/23/18 1545      Check-In   Supervising physician immediately available to respond to emergencies  See telemetry face sheet for immediately available MD    Location  [facility]    Staff Present  Russella Dar, MS, EP, [facility], Exercise Physiologist;Debra Wynetta Emery, RN, Cory Munch, Exercise Physiologist    Medication changes reported      No    Fall or balance concerns reported     No    Tobacco Cessation  No Change    Warm-up and Cool-down  Performed as group-led instruction    Resistance Training Performed  Yes    VAD Patient?  No    PAD/SET Patient?  No      Pain Assessment   Currently in Pain?  No/denies    Pain Score  0-No pain    Multiple Pain Sites  No       Capillary Blood Glucose: No results found for this or any previous visit (from the past 24 hour(s)).    Social History   Tobacco Use  Smoking Status Current Some Day Smoker  . Packs/day: 0.01  . Years: 53.00  . Pack years: 0.53  . Types: Cigarettes  . Start date: 08/10/1964  Smokeless Tobacco Never Used  Tobacco Comment   She is weaning herself down.     Goals Met:  Independence with exercise equipment Exercise tolerated well No report of cardiac concerns or symptoms Strength training completed today  Goals Unmet:  Not Applicable  Comments: Pt able to follow exercise prescription today without complaint.  Will continue to monitor for progression. Check out 4:45.    Dr. Kate Sable is Medical Director for [facility] Cardiac and Pulmonary Rehab.

## 2018-01-26 ENCOUNTER — Encounter (HOSPITAL_COMMUNITY)
Admission: RE | Admit: 2018-01-26 | Discharge: 2018-01-26 | Disposition: A | Discharge status: Home / Self Care | Payer: BLUE CROSS/BLUE SHIELD | Source: Ambulatory Visit | Attending: Cardiology | Admitting: Cardiology

## 2018-01-26 DIAGNOSIS — E782 Mixed hyperlipidemia: Secondary | ICD-10-CM | POA: Diagnosis not present

## 2018-01-26 DIAGNOSIS — I10 Essential (primary) hypertension: Secondary | ICD-10-CM | POA: Diagnosis not present

## 2018-01-26 DIAGNOSIS — Z955 Presence of coronary angioplasty implant and graft: Secondary | ICD-10-CM

## 2018-01-26 DIAGNOSIS — I214 Non-ST elevation (NSTEMI) myocardial infarction: Secondary | ICD-10-CM | POA: Diagnosis not present

## 2018-01-26 DIAGNOSIS — Z79899 Other long term (current) drug therapy: Secondary | ICD-10-CM | POA: Diagnosis not present

## 2018-01-26 DIAGNOSIS — F17210 Nicotine dependence, cigarettes, uncomplicated: Secondary | ICD-10-CM | POA: Diagnosis not present

## 2018-01-26 DIAGNOSIS — I2510 Atherosclerotic heart disease of native coronary artery without angina pectoris: Secondary | ICD-10-CM | POA: Diagnosis not present

## 2018-01-26 DIAGNOSIS — Z7982 Long term (current) use of aspirin: Secondary | ICD-10-CM | POA: Diagnosis not present

## 2018-01-26 NOTE — Progress Notes (Signed)
Daily Session Note  Patient Details  Name: KEILANI TERRANCE MRN: [id number] Date of Birth: 1950-10-12 Referring Provider:     [place] from 12/18/2017 in [place]  Referring Provider  Domenic Polite      Encounter Date: 01/26/2018  Check In: Session Check In - 01/26/18 1545      Check-In   Supervising physician immediately available to respond to emergencies  See telemetry face sheet for immediately available MD    Location  [facility]    Staff Present  Aundra Dubin, RN, Cory Munch, Exercise Physiologist    Medication changes reported      No    Fall or balance concerns reported     No    Tobacco Cessation  No Change    Warm-up and Cool-down  Performed as group-led instruction    Resistance Training Performed  Yes    VAD Patient?  No    PAD/SET Patient?  No      Pain Assessment   Currently in Pain?  No/denies    Pain Score  0-No pain    Multiple Pain Sites  No       Capillary Blood Glucose: No results found for this or any previous visit (from the past 24 hour(s)).    Social History   Tobacco Use  Smoking Status Current Some Day Smoker  . Packs/day: 0.01  . Years: 53.00  . Pack years: 0.53  . Types: Cigarettes  . Start date: 08/10/1964  Smokeless Tobacco Never Used  Tobacco Comment   She is weaning herself down.     Goals Met:  Independence with exercise equipment Exercise tolerated well No report of cardiac concerns or symptoms Strength training completed today  Goals Unmet:  Not Applicable  Comments: Pt able to follow exercise prescription today without complaint.  Will continue to monitor for progression. Check out 1645.   Dr. Kate Sable is Medical Director for [facility] Cardiac and Pulmonary Rehab.

## 2018-01-28 ENCOUNTER — Encounter (HOSPITAL_COMMUNITY)
Admission: RE | Admit: 2018-01-28 | Discharge: 2018-01-28 | Disposition: A | Discharge status: Home / Self Care | Payer: BLUE CROSS/BLUE SHIELD | Source: Ambulatory Visit | Attending: Cardiology | Admitting: Cardiology

## 2018-01-28 DIAGNOSIS — I214 Non-ST elevation (NSTEMI) myocardial infarction: Secondary | ICD-10-CM | POA: Diagnosis not present

## 2018-01-28 DIAGNOSIS — E782 Mixed hyperlipidemia: Secondary | ICD-10-CM | POA: Insufficient documentation

## 2018-01-28 DIAGNOSIS — I2510 Atherosclerotic heart disease of native coronary artery without angina pectoris: Secondary | ICD-10-CM | POA: Insufficient documentation

## 2018-01-28 DIAGNOSIS — F17210 Nicotine dependence, cigarettes, uncomplicated: Secondary | ICD-10-CM | POA: Diagnosis not present

## 2018-01-28 DIAGNOSIS — I10 Essential (primary) hypertension: Secondary | ICD-10-CM | POA: Insufficient documentation

## 2018-01-28 DIAGNOSIS — Z955 Presence of coronary angioplasty implant and graft: Secondary | ICD-10-CM | POA: Insufficient documentation

## 2018-01-28 DIAGNOSIS — Z7982 Long term (current) use of aspirin: Secondary | ICD-10-CM | POA: Diagnosis not present

## 2018-01-28 DIAGNOSIS — Z79899 Other long term (current) drug therapy: Secondary | ICD-10-CM | POA: Diagnosis not present

## 2018-01-28 NOTE — Progress Notes (Signed)
Daily Session Note  Patient Details  Name: Stacy Douglas MRN: [id number] Date of Birth: 1950/10/25 Referring Provider:     [place] from 12/18/2017 in [place]  Referring Provider  Domenic Polite      Encounter Date: 01/28/2018  Check In: Session Check In - 01/28/18 1545      Check-In   Supervising physician immediately available to respond to emergencies  See telemetry face sheet for immediately available MD    Location  [facility]    Staff Present  Aundra Dubin, RN, Cory Munch, Exercise Physiologist    Medication changes reported      No    Fall or balance concerns reported     No    Tobacco Cessation  No Change   Still smoking 3 cigarettes/day.    Warm-up and Cool-down  Performed as group-led Higher education careers adviser Performed  Yes    VAD Patient?  No    PAD/SET Patient?  No      Pain Assessment   Currently in Pain?  No/denies    Pain Score  0-No pain    Multiple Pain Sites  No       Capillary Blood Glucose: No results found for this or any previous visit (from the past 24 hour(s)).    Social History   Tobacco Use  Smoking Status Current Some Day Smoker  . Packs/day: 0.01  . Years: 53.00  . Pack years: 0.53  . Types: Cigarettes  . Start date: 08/10/1964  Smokeless Tobacco Never Used  Tobacco Comment   She is weaning herself down.     Goals Met:  Independence with exercise equipment Exercise tolerated well No report of cardiac concerns or symptoms Strength training completed today  Goals Unmet:  Not Applicable  Comments: Pt able to follow exercise prescription today without complaint.  Will continue to monitor for progression. Check out 1645.   Dr. Kate Sable is Medical Director for [facility] Cardiac and Pulmonary Rehab.

## 2018-01-30 ENCOUNTER — Encounter (HOSPITAL_COMMUNITY)
Admission: RE | Admit: 2018-01-30 | Discharge: 2018-01-30 | Disposition: A | Discharge status: Home / Self Care | Payer: BLUE CROSS/BLUE SHIELD | Source: Ambulatory Visit | Attending: Cardiology | Admitting: Cardiology

## 2018-01-30 DIAGNOSIS — E782 Mixed hyperlipidemia: Secondary | ICD-10-CM | POA: Diagnosis not present

## 2018-01-30 DIAGNOSIS — I214 Non-ST elevation (NSTEMI) myocardial infarction: Secondary | ICD-10-CM

## 2018-01-30 DIAGNOSIS — Z955 Presence of coronary angioplasty implant and graft: Secondary | ICD-10-CM | POA: Diagnosis not present

## 2018-01-30 DIAGNOSIS — Z79899 Other long term (current) drug therapy: Secondary | ICD-10-CM | POA: Diagnosis not present

## 2018-01-30 DIAGNOSIS — F17210 Nicotine dependence, cigarettes, uncomplicated: Secondary | ICD-10-CM | POA: Diagnosis not present

## 2018-01-30 DIAGNOSIS — I10 Essential (primary) hypertension: Secondary | ICD-10-CM | POA: Diagnosis not present

## 2018-01-30 DIAGNOSIS — Z7982 Long term (current) use of aspirin: Secondary | ICD-10-CM | POA: Diagnosis not present

## 2018-01-30 DIAGNOSIS — I2510 Atherosclerotic heart disease of native coronary artery without angina pectoris: Secondary | ICD-10-CM | POA: Diagnosis not present

## 2018-01-30 NOTE — Progress Notes (Signed)
Daily Session Note  Patient Details  Name: Stacy Douglas MRN: [id number] Date of Birth: 1951-03-05 Referring Provider:     [place] from 12/18/2017 in [place]  Referring Provider  Domenic Polite      Encounter Date: 01/30/2018  Check In: Session Check In - 01/30/18 1545      Check-In   Supervising physician immediately available to respond to emergencies  See telemetry face sheet for immediately available MD    Location  [facility]    Staff Present  Aundra Dubin, RN, Cory Munch, Exercise Physiologist    Medication changes reported      No    Fall or balance concerns reported     No    Tobacco Cessation  No Change    Warm-up and Cool-down  Performed as group-led instruction    Resistance Training Performed  Yes    VAD Patient?  No    PAD/SET Patient?  No      Pain Assessment   Currently in Pain?  No/denies    Pain Score  0-No pain    Multiple Pain Sites  No       Capillary Blood Glucose: No results found for this or any previous visit (from the past 24 hour(s)).    Social History   Tobacco Use  Smoking Status Current Some Day Smoker  . Packs/day: 0.01  . Years: 53.00  . Pack years: 0.53  . Types: Cigarettes  . Start date: 08/10/1964  Smokeless Tobacco Never Used  Tobacco Comment   She is weaning herself down.     Goals Met:  Independence with exercise equipment Exercise tolerated well No report of cardiac concerns or symptoms Strength training completed today  Goals Unmet:  Not Applicable  Comments: Pt able to follow exercise prescription today without complaint.  Will continue to monitor for progression. Check out 1645   Dr. Kate Sable is Medical Director for [facility] Cardiac and Pulmonary Rehab.

## 2018-02-02 ENCOUNTER — Encounter (HOSPITAL_COMMUNITY)
Admission: RE | Admit: 2018-02-02 | Discharge: 2018-02-02 | Disposition: A | Discharge status: Home / Self Care | Payer: BLUE CROSS/BLUE SHIELD | Source: Ambulatory Visit | Attending: Cardiology | Admitting: Cardiology

## 2018-02-02 DIAGNOSIS — I214 Non-ST elevation (NSTEMI) myocardial infarction: Secondary | ICD-10-CM | POA: Diagnosis not present

## 2018-02-02 DIAGNOSIS — Z7982 Long term (current) use of aspirin: Secondary | ICD-10-CM | POA: Diagnosis not present

## 2018-02-02 DIAGNOSIS — I10 Essential (primary) hypertension: Secondary | ICD-10-CM | POA: Diagnosis not present

## 2018-02-02 DIAGNOSIS — Z79899 Other long term (current) drug therapy: Secondary | ICD-10-CM | POA: Diagnosis not present

## 2018-02-02 DIAGNOSIS — F17210 Nicotine dependence, cigarettes, uncomplicated: Secondary | ICD-10-CM | POA: Diagnosis not present

## 2018-02-02 DIAGNOSIS — E782 Mixed hyperlipidemia: Secondary | ICD-10-CM | POA: Diagnosis not present

## 2018-02-02 DIAGNOSIS — Z955 Presence of coronary angioplasty implant and graft: Secondary | ICD-10-CM

## 2018-02-02 DIAGNOSIS — I2510 Atherosclerotic heart disease of native coronary artery without angina pectoris: Secondary | ICD-10-CM | POA: Diagnosis not present

## 2018-02-02 NOTE — Progress Notes (Signed)
Daily Session Note  Patient Details  Name: Stacy Douglas MRN: [id number] Date of Birth: 01/17/1951 Referring Provider:     [place] from 12/18/2017 in [place]  Referring Provider  Domenic Polite      Encounter Date: 02/02/2018  Check In: Session Check In - 02/02/18 1545      Check-In   Supervising physician immediately available to respond to emergencies  See telemetry face sheet for immediately available MD    Location  [facility]    Staff Present  Aundra Dubin, RN, Cory Munch, Exercise Physiologist    Medication changes reported      No    Fall or balance concerns reported     No    Tobacco Cessation  No Change    Warm-up and Cool-down  Performed as group-led instruction    Resistance Training Performed  Yes    VAD Patient?  No    PAD/SET Patient?  No      Pain Assessment   Currently in Pain?  No/denies    Pain Score  0-No pain    Multiple Pain Sites  No       Capillary Blood Glucose: No results found for this or any previous visit (from the past 24 hour(s)).    Social History   Tobacco Use  Smoking Status Current Some Day Smoker  . Packs/day: 0.01  . Years: 53.00  . Pack years: 0.53  . Types: Cigarettes  . Start date: 08/10/1964  Smokeless Tobacco Never Used  Tobacco Comment   She is weaning herself down.     Goals Met:  Independence with exercise equipment Exercise tolerated well No report of cardiac concerns or symptoms Strength training completed today  Goals Unmet:  Not Applicable  Comments: Pt able to follow exercise prescription today without complaint.  Will continue to monitor for progression. Check out 1645.   Dr. Kate Sable is Medical Director for [facility] Cardiac and Pulmonary Rehab.

## 2018-02-04 ENCOUNTER — Encounter (HOSPITAL_COMMUNITY)
Admission: RE | Admit: 2018-02-04 | Discharge: 2018-02-04 | Disposition: A | Discharge status: Home / Self Care | Payer: BLUE CROSS/BLUE SHIELD | Source: Ambulatory Visit | Attending: Cardiology | Admitting: Cardiology

## 2018-02-04 DIAGNOSIS — E782 Mixed hyperlipidemia: Secondary | ICD-10-CM | POA: Diagnosis not present

## 2018-02-04 DIAGNOSIS — I214 Non-ST elevation (NSTEMI) myocardial infarction: Secondary | ICD-10-CM | POA: Diagnosis not present

## 2018-02-04 DIAGNOSIS — F17210 Nicotine dependence, cigarettes, uncomplicated: Secondary | ICD-10-CM | POA: Diagnosis not present

## 2018-02-04 DIAGNOSIS — I10 Essential (primary) hypertension: Secondary | ICD-10-CM | POA: Diagnosis not present

## 2018-02-04 DIAGNOSIS — Z955 Presence of coronary angioplasty implant and graft: Secondary | ICD-10-CM | POA: Diagnosis not present

## 2018-02-04 DIAGNOSIS — Z79899 Other long term (current) drug therapy: Secondary | ICD-10-CM | POA: Diagnosis not present

## 2018-02-04 DIAGNOSIS — Z7982 Long term (current) use of aspirin: Secondary | ICD-10-CM | POA: Diagnosis not present

## 2018-02-04 DIAGNOSIS — I2510 Atherosclerotic heart disease of native coronary artery without angina pectoris: Secondary | ICD-10-CM | POA: Diagnosis not present

## 2018-02-04 NOTE — Progress Notes (Signed)
Cardiac Individual Treatment Plan  Patient Details  Name: Stacy Douglas MRN: [id number] Date of Birth: 1950-08-02 Referring Provider:     [place] from 12/18/2017 in [place]  Referring Provider  Domenic Polite      Initial Encounter Date:    CARDIAC REHAB PHASE II ORIENTATION from 12/18/2017 in [place]  Date  12/18/17      Visit Diagnosis: NSTEMI (non-ST elevated myocardial infarction) [facility])  Status post coronary artery stent placement  Patient's Home Medications on Admission:  Current Outpatient Medications:  .  aspirin EC 81 MG tablet, Take 81 mg by mouth daily., Disp: , Rfl:  .  metoprolol succinate (TOPROL XL) 25 MG 24 hr tablet, Take 0.5 tablets (12.5 mg total) by mouth daily., Disp: 45 tablet, Rfl: 3 .  nitroGLYCERIN (NITROSTAT) 0.4 MG SL tablet, Place 1 tablet (0.4 mg total) under the tongue every 5 (five) minutes x 3 doses as needed., Disp: 25 tablet, Rfl: 3  Past Medical History: Past Medical History:  Diagnosis Date  . Coronary atherosclerosis of native coronary artery    a. VF arrest in [place] s/p BMS LAD 5/11, PTCA circumflex 6/11, LVEF 40%. b. [place] 10/2014 s/p DES to prox and mid RCA, otherwise patent LAD stent and moderate disease in the Cx, LVEF 55-65%. c. low-risk NST in 06/2017 d. 09/2017: NSTEMI with cath showing 99% mid-LCx stenosis --> treated with DES; patent stents along LAD and RCA.   Marland Kitchen DVT (deep venous thrombosis) (HCC) 1992   LLE  . Essential hypertension   . History of duodenal ulcer 1960s  . Mixed hyperlipidemia   . Myocardial infarction ([place]) 04/270   complicated by VF arrest  . NSTEMI (non-ST elevated myocardial infarction) ([place]) 10/07/2017   Archie Endo 10/07/2017  . Obesity   . Plavix resistance   . Tobacco abuse     Tobacco Use: Social History   Tobacco Use  Smoking Status Current Some Day Smoker  . Packs/day: 0.01  . Years: 53.00  . Pack years: 0.53  . Types:  Cigarettes  . Start date: 08/10/1964  Smokeless Tobacco Never Used  Tobacco Comment   She is weaning herself down.     Labs: Recent Review Flowsheet Data    Labs for ITP Cardiac and Pulmonary Rehab Latest Ref Rng & Units 11/16/2014 03/04/2015 07/31/2016 10/08/2017 12/18/2017   Cholestrol <200 mg/dL 152 149 158 154 149   LDLCALC mg/dL (calc) 95 95 99 94 85   HDL >50 mg/dL 34(L) 41(L) 39(L) 37(L) 45(L)   Trlycerides <150 mg/dL 113 64 100 117 91   Hemoglobin A1c 4.8 - 5.6 % - - - - -      Capillary Blood Glucose: No results found for: GLUCAP   Exercise Target Goals: Exercise Program Goal: Individual exercise prescription set using results from initial 6 min walk test and THRR while considering  patient's activity barriers and safety.   Exercise Prescription Goal: Starting with aerobic activity 30 plus minutes a day, 3 days per week for initial exercise prescription. Provide home exercise prescription and guidelines that participant acknowledges understanding prior to discharge.  Activity Barriers & Risk Stratification: Activity Barriers & Cardiac Risk Stratification - 12/18/17 1609      Activity Barriers & Cardiac Risk Stratification   Activity Barriers  None    Cardiac Risk Stratification  High       6 Minute Walk: 6 Minute Walk    Row Name 12/18/17 1606  6 Minute Walk   Phase  Initial     Distance  1450 feet     Walk Time  6 minutes     # of Rest Breaks  0     MPH  2.75     METS  3.1     RPE  13     Perceived Dyspnea   9     VO2 Peak  10.67     Symptoms  Yes (comment)     Comments  Had a little discomfort in (R) upper chest. Went away after 56mnute rest.     Resting HR  81 bpm     Resting BP  114/60     Resting Oxygen Saturation   98 %     Exercise Oxygen Saturation  during 6 min walk  90 %     Max Ex. HR  96 bpm     Max Ex. BP  140/80     2 Minute Post BP  118/70        Oxygen Initial Assessment:   Oxygen Re-Evaluation:   Oxygen Discharge (Final  Oxygen Re-Evaluation):   Initial Exercise Prescription: Initial Exercise Prescription - 12/18/17 1600      Date of Initial Exercise RX and Referring Provider   Date  12/18/17    Referring Provider  MDomenic Polite   Expected Discharge Date  03/20/18      Treadmill   MPH  1.7    Grade  0    Minutes  17    METs  2.3      Recumbant Elliptical   Level  1    RPM  43    Watts  46    Minutes  22    METs  2.7      Prescription Details   Frequency (times per week)  3    Duration  Progress to 30 minutes of continuous aerobic without signs/symptoms of physical distress      Intensity   THRR 40-80% of Max Heartrate  111-125-140    Ratings of Perceived Exertion  11-13    Perceived Dyspnea  0-4      Progression   Progression  Continue progressive overload as per policy without signs/symptoms or physical distress.      Resistance Training   Training Prescription  Yes    Weight  1    Reps  10-15       Perform Capillary Blood Glucose checks as needed.  Exercise Prescription Changes:  Exercise Prescription Changes    Row Name 01/01/18 1500 01/27/18 0800           Response to Exercise   Blood Pressure (Admit)  122/70  126/60      Blood Pressure (Exercise)  142/68  130/70      Blood Pressure (Exit)  110/60  110/60      Heart Rate (Admit)  82 bpm  74 bpm      Heart Rate (Exercise)  109 bpm  108 bpm      Heart Rate (Exit)  91 bpm  79 bpm      Rating of Perceived Exertion (Exercise)  12  12      Duration  Progress to 30 minutes of  aerobic without signs/symptoms of physical distress  Progress to 30 minutes of  aerobic without signs/symptoms of physical distress      Intensity  THRR New 111-125-140  THRR unchanged        Progression   Progression  Continue to progress workloads to maintain intensity without signs/symptoms of physical distress.  Continue to progress workloads to maintain intensity without signs/symptoms of physical distress.      Average METs  3.08  3.44         Resistance Training   Training Prescription  Yes  Yes      Weight  1  2      Reps  10-15  10-15        Treadmill   MPH  1.8  2.2      Grade  0  0      Minutes  17  17      METs  2.37  2.68        Recumbant Elliptical   Level  1  2      RPM  60  56      Watts  76  77      Minutes  22  22      METs  2.7  4.2        Home Exercise Plan   Plans to continue exercise at  -  Home (comment) treadmill, hand weights       Frequency  -  Add 2 additional days to program exercise sessions.      Initial Home Exercises Provided  -  01/05/18         Exercise Comments:  Exercise Comments    Row Name 01/01/18 1543 01/12/18 1325 02/02/18 1448       Exercise Comments  Patient has been doing well so far in rehab. She has only had 5 visits to date. She has already shown to increase her MET level on the equipment as tolerated with progression.   Patient is doing well in the program thus far. She has tolerated the equipment and weight increases.   Continues to progress as tolerated. Increased overall MET level while increasing workloads.         Exercise Goals and Review:  Exercise Goals    Row Name 12/18/17 1614             Exercise Goals   Able to understand and use rate of perceived exertion (RPE) scale  Yes       Intervention  Provide education and explanation on how to use RPE scale       Expected Outcomes  Short Term: Able to use RPE daily in rehab to express subjective intensity level;Long Term:  Able to use RPE to guide intensity level when exercising independently       Knowledge and understanding of Target Heart Rate Range (THRR)  Yes       Intervention  Provide education and explanation of THRR including how the numbers were predicted and where they are located for reference       Expected Outcomes  Short Term: Able to use daily as guideline for intensity in rehab;Long Term: Able to use THRR to govern intensity when exercising independently       Able to check pulse independently   Yes       Intervention  Provide education and demonstration on how to check pulse in carotid and radial arteries.       Expected Outcomes  Short Term: Able to explain why pulse checking is important during independent exercise;Long Term: Able to check pulse independently and accurately       Understanding of Exercise Prescription  Yes       Intervention  Provide education, explanation,  and written materials on patient's individual exercise prescription       Expected Outcomes  Short Term: Able to explain program exercise prescription;Long Term: Able to explain home exercise prescription to exercise independently          Exercise Goals Re-Evaluation : Exercise Goals Re-Evaluation    Row Name 01/01/18 1539 01/12/18 1323 02/02/18 1444         Exercise Goal Re-Evaluation   Exercise Goals Review  Increase Physical Activity;Understanding of Exercise Prescription  Increase Physical Activity;Increase Strength and Stamina;Understanding of Exercise Prescription  Increase Physical Activity;Increase Strength and Stamina;Understanding of Exercise Prescription     Comments  Patient's most prioritized goal is to lose weight, she'd like to lose 40 pounds in a year. She has been working hard in rehab to increase her activity level which will aid in her weight loss.   Patient's most prioritized goal is to lose weight, she'd like to lose 40 pounds in a year. She has been working hard in rehab to increase her activity level which will aid in her weight loss.   Patient has done well in the program. She has increased her workloads on the equipment and weights, while increasing her overall MET level. She is still trying to lose weight but has seemed to hit a plateu but says she feels better since exercising.      Expected Outcomes  Patient will lose weight.   Patient ultimate goal is to lose weight.   Increase strength and stamina. Lose weight.          Discharge Exercise Prescription (Final Exercise Prescription  Changes): Exercise Prescription Changes - 01/27/18 0800      Response to Exercise   Blood Pressure (Admit)  126/60    Blood Pressure (Exercise)  130/70    Blood Pressure (Exit)  110/60    Heart Rate (Admit)  74 bpm    Heart Rate (Exercise)  108 bpm    Heart Rate (Exit)  79 bpm    Rating of Perceived Exertion (Exercise)  12    Duration  Progress to 30 minutes of  aerobic without signs/symptoms of physical distress    Intensity  THRR unchanged      Progression   Progression  Continue to progress workloads to maintain intensity without signs/symptoms of physical distress.    Average METs  3.44      Resistance Training   Training Prescription  Yes    Weight  2    Reps  10-15      Treadmill   MPH  2.2    Grade  0    Minutes  17    METs  2.68      Recumbant Elliptical   Level  2    RPM  56    Watts  77    Minutes  22    METs  4.2      Home Exercise Plan   Plans to continue exercise at  Home (comment)   treadmill, hand weights    Frequency  Add 2 additional days to program exercise sessions.    Initial Home Exercises Provided  01/05/18       Nutrition:  Target Goals: Understanding of nutrition guidelines, daily intake of sodium <1543m, cholesterol <2034m calories 30% from fat and 7% or less from saturated fats, daily to have 5 or more servings of fruits and vegetables.  Biometrics: Pre Biometrics - 12/18/17 1616      Pre Biometrics   Height  '5\' 4"'  (1.626 m)    Weight  90.4 kg    Waist Circumference  42.5 inches    Hip Circumference  49 inches    Waist to Hip Ratio  0.87 %    BMI (Calculated)  34.21    Triceps Skinfold  23 mm    % Body Fat  43.9 %    Grip Strength  5.71 kg    Flexibility  12.8 in    Single Leg Stand  4 seconds        Nutrition Therapy Plan and Nutrition Goals: Nutrition Therapy & Goals - 02/04/18 1430      Nutrition Therapy   RD appointment deferred  Yes      Personal Nutrition Goals   Comments  Patient not able to make RD  appointment due to work schedule. She continues to say she is eating heart healthy.       Nutrition Assessments: Nutrition Assessments - 12/18/17 1629      MEDFICTS Scores   Pre Score  37       Nutrition Goals Re-Evaluation:   Nutrition Goals Discharge (Final Nutrition Goals Re-Evaluation):   Psychosocial: Target Goals: Acknowledge presence or absence of significant depression and/or stress, maximize coping skills, provide positive support system. Participant is able to verbalize types and ability to use techniques and skills needed for reducing stress and depression.  Initial Review & Psychosocial Screening: Initial Psych Review & Screening - 12/18/17 1625      Initial Review   Current issues with  None Identified      Family Dynamics   Good Support System?  Yes      Barriers   Psychosocial barriers to participate in program  There are no identifiable barriers or psychosocial needs.      Screening Interventions   Interventions  Encouraged to exercise    Expected Outcomes  Short Term goal: Identification and review with participant of any Quality of Life or Depression concerns found by scoring the questionnaire.;Long Term goal: The participant improves quality of Life and PHQ9 Scores as seen by post scores and/or verbalization of changes       Quality of Life Scores: Quality of Life - 12/18/17 1628      Quality of Life   Select  Quality of Life      Quality of Life Scores   Health/Function Pre  23.73 %    Socioeconomic Pre  24.36 %    Psych/Spiritual Pre  23.14 %    Family Pre  25.5 %    GLOBAL Pre  23.97 %      Scores of 19 and below usually indicate a poorer quality of life in these areas.  A difference of  2-3 points is a clinically meaningful difference.  A difference of 2-3 points in the total score of the Quality of Life Index has been associated with significant improvement in overall quality of life, self-image, physical symptoms, and general health in  studies assessing change in quality of life.  PHQ-9: Recent Review Flowsheet Data    Depression screen [facility] 2/9 08/12/2016 05/30/2015 12/14/2014 12/14/2014 11/02/2013   Decreased Interest 0 0 0 0 0   Down, Depressed, Hopeless 0 0 0 0 0   PHQ - 2 Score 0 0 0 0 0   Altered sleeping - - 1 - -   Tired, decreased energy - - 1 - -   PHQ-9 Score - - 2 - -     Interpretation of Total Score  Total Score Depression Severity:  1-4 = Minimal depression, 5-9 = Mild depression, 10-14 = Moderate depression, 15-19 = Moderately severe depression, 20-27 = Severe depression   Psychosocial Evaluation and Intervention: Psychosocial Evaluation - 12/18/17 1627      Psychosocial Evaluation & Interventions   Interventions  Encouraged to exercise with the program and follow exercise prescription    Continue Psychosocial Services   No Follow up required       Psychosocial Re-Evaluation: Psychosocial Re-Evaluation    [place] Name 01/12/18 1418 02/04/18 1437           Psychosocial Re-Evaluation   Current issues with  None Identified  None Identified      Comments  Patient's initial QOL score was 23.97 and her PHQ-9 score was 2 with no psychosocial issues identified.   Patient's initial QOL score was 23.97 and her PHQ-9 score was 2 with no psychosocial issues identified.       Expected Outcomes  Patient will have no psychosocial issues identified at discharge.   Patient will have no psychosocial issues identified at discharge.       Interventions  Relaxation education;Encouraged to attend Cardiac Rehabilitation for the exercise;Stress management education  Relaxation education;Encouraged to attend Cardiac Rehabilitation for the exercise;Stress management education      Continue Psychosocial Services   No Follow up required  No Follow up required         Psychosocial Discharge (Final Psychosocial Re-Evaluation): Psychosocial Re-Evaluation - 02/04/18 1437      Psychosocial Re-Evaluation   Current issues with   None Identified    Comments  Patient's initial QOL score was 23.97 and her PHQ-9 score was 2 with no psychosocial issues identified.     Expected Outcomes  Patient will have no psychosocial issues identified at discharge.     Interventions  Relaxation education;Encouraged to attend Cardiac Rehabilitation for the exercise;Stress management education    Continue Psychosocial Services   No Follow up required       Vocational Rehabilitation: Provide vocational rehab assistance to qualifying candidates.   Vocational Rehab Evaluation & Intervention: Vocational Rehab - 12/18/17 1631      Initial Vocational Rehab Evaluation & Intervention   Assessment shows need for Vocational Rehabilitation  No       Education: Education Goals: Education classes will be provided on a weekly basis, covering required topics. Participant will state understanding/return demonstration of topics presented.  Learning Barriers/Preferences: Learning Barriers/Preferences - 12/18/17 1630      Learning Barriers/Preferences   Learning Barriers  None    Learning Preferences  Pictoral;Computer/Internet;Video;Written Material       Education Topics: Hypertension, Hypertension Reduction -Define heart disease and high blood pressure. Discus how high blood pressure affects the body and ways to reduce high blood pressure.   Exercise and Your Heart -Discuss why it is important to exercise, the FITT principles of exercise, normal and abnormal responses to exercise, and how to exercise safely.   Angina -Discuss definition of angina, causes of angina, treatment of angina, and how to decrease risk of having angina.   Cardiac Medications -Review what the following cardiac medications are used for, how they affect the body, and side effects that may occur when taking the medications.  Medications include Aspirin, Beta blockers, calcium channel blockers, ACE Inhibitors, angiotensin receptor blockers, diuretics, digoxin,  and antihyperlipidemics.   CARDIAC REHAB PHASE II EXERCISE from 01/28/2018 in [place]  Date  12/24/17  Educator  DJ  Instruction Review Code  2- Demonstrated Understanding      Congestive Heart Failure -Discuss the definition of CHF, how to live with CHF, the signs and symptoms of CHF, and how keep track of weight and sodium intake.   CARDIAC REHAB PHASE II EXERCISE from 01/28/2018 in [place]  Date  12/31/17  Educator  D. Coad  Instruction Review Code  2- Demonstrated Understanding      Heart Disease and Intimacy -Discus the effect sexual activity has on the heart, how changes occur during intimacy as we age, and safety during sexual activity.   CARDIAC REHAB PHASE II EXERCISE from 01/28/2018 in [place]  Date  01/07/18  Educator  Etheleen Mayhew  Instruction Review Code  2- Demonstrated Understanding      Smoking Cessation / COPD -Discuss different methods to quit smoking, the health benefits of quitting smoking, and the definition of COPD.   CARDIAC REHAB PHASE II EXERCISE from 01/28/2018 in [place]  Date  01/14/18  Educator  Etheleen Mayhew  Instruction Review Code  2- Demonstrated Understanding      Nutrition I: Fats -Discuss the types of cholesterol, what cholesterol does to the heart, and how cholesterol levels can be controlled.   CARDIAC REHAB PHASE II EXERCISE from 01/28/2018 in [place]  Date  01/21/18  Educator  Etheleen Mayhew  Instruction Review Code  2- Demonstrated Understanding      Nutrition II: Labels -Discuss the different components of food labels and how to read food label   [place] from 01/28/2018 in [place]  Date  01/28/18  Educator  Etheleen Mayhew  Instruction Review Code  2- Demonstrated Understanding      Heart Parts/Heart Disease and PAD -Discuss the anatomy of the heart, the pathway of  blood circulation through the heart, and these are affected by heart disease.   Stress I: Signs and Symptoms -Discuss the causes of stress, how stress may lead to anxiety and depression, and ways to limit stress.   Stress II: Relaxation -Discuss different types of relaxation techniques to limit stress.   Warning Signs of Stroke / TIA -Discuss definition of a stroke, what the signs and symptoms are of a stroke, and how to identify when someone is having stroke.   Knowledge Questionnaire Score: Knowledge Questionnaire Score - 12/18/17 1630      Knowledge Questionnaire Score   Pre Score  26/28       Core Components/Risk Factors/Patient Goals at Admission: Personal Goals and Risk Factors at Admission - 12/18/17 1631      Core Components/Risk Factors/Patient Goals on Admission    Weight Management  Yes    Intervention  Weight Management/Obesity: Establish reasonable short term and long term weight goals.    Admit Weight  199 lb 6.4 oz (90.4 kg)    Goal Weight: Short Term  194 lb 6.4 oz (88.2 kg)    Goal Weight: Long Term  189 lb 6.4 oz (85.9 kg)    Expected Outcomes  Short Term: Continue to assess and modify interventions until short term weight is achieved;Long Term: Adherence to nutrition and physical activity/exercise program aimed toward attainment of established weight goal    Personal Goal Other  Yes    Personal Goal  Patient would like to lose on short-term 3lbs month while in program and on a long term basis lose 40lbs overall.    Intervention  Attend CR 3 x week and supplement  with 2 x week exercise at home.    Expected Outcomes  Achieve short-term and long-term goals.        Core Components/Risk Factors/Patient Goals Review:  Goals and Risk Factor Review    Row Name 01/12/18 1414 02/04/18 1432           Core Components/Risk Factors/Patient Goals Review   Personal Goals Review  Weight Management/Obesity Lose weight 3 lbs/month; 40 in one year.   Weight  Management/Obesity Lose weight 3 lbs/month and 40 in one year.       Review  Patient has completed 9 sessions gaining 3 lbs since her initial visit. She says she is trying to eat heart healthy. She continues to try and quit smoking on her own and is down to 3 cigarettes/day. She is doing well in the program with progression. She says she feels a little stronger and has returned to work. She is pleased with her progress so far and hopes to improve more as she continues.   Patient has completed 17 sessions gaining 3 lbs since her initial visit. She continues to say she is trying to eat heart healthy but is also trying to quit smoking which has been stressful causing her to eat more. She is currently smoking 3 to 5 cigarettes/day depending on her stress level for the day. She continues to do well in the program with progression. She says she has more energy now and is able to do more at home when she gets off work. She feels like cleaning her house after working all day. Will continue to monitor for progress.       Expected Outcomes  Patient will continue to attend sessions and complete the program and meet her personal goals.   Patient will continue to attend sessions and complete the program and meet her personal goals.          Core Components/Risk Factors/Patient Goals at Discharge (Final Review):  Goals and Risk Factor Review - 02/04/18 1432      Core Components/Risk Factors/Patient Goals Review   Personal Goals Review  Weight Management/Obesity   Lose weight 3 lbs/month and 40 in one year.    Review  Patient has completed 17 sessions gaining 3 lbs since her initial visit. She continues to say she is trying to eat heart healthy but is also trying to quit smoking which has been stressful causing her to eat more. She is currently smoking 3 to 5 cigarettes/day depending on her stress level for the day. She continues to do well in the program with progression. She says she has more energy now and is able  to do more at home when she gets off work. She feels like cleaning her house after working all day. Will continue to monitor for progress.     Expected Outcomes  Patient will continue to attend sessions and complete the program and meet her personal goals.        ITP Comments: ITP Comments    Row Name 12/22/17 1405 02/02/18 1556         ITP Comments  Patient new to program. Plans to start Monday 12/22/17.  Patient continues to smoke on average 3 cigarettes/day but says she does smoke more if she has a stressful day. Will continue to monitor.          Comments: ITP REVIEW Patient is doing well in the program. Will continue to monitor for progress.

## 2018-02-04 NOTE — Progress Notes (Signed)
Daily Session Note  Patient Details  Name: Stacy Douglas MRN: [id number] Date of Birth: 03-17-1951 Referring Provider:     [place] from 12/18/2017 in [place]  Referring Provider  Domenic Polite      Encounter Date: 02/04/2018  Check In: Session Check In - 02/04/18 1545      Check-In   Supervising physician immediately available to respond to emergencies  See telemetry face sheet for immediately available MD    Location  [facility]    Staff Present  Aundra Dubin, RN, Cory Munch, Exercise Physiologist    Medication changes reported      No    Fall or balance concerns reported     No    Tobacco Cessation  No Change    Warm-up and Cool-down  Performed as group-led instruction    Resistance Training Performed  Yes    VAD Patient?  No    PAD/SET Patient?  No      Pain Assessment   Currently in Pain?  No/denies    Pain Score  0-No pain    Multiple Pain Sites  No       Capillary Blood Glucose: No results found for this or any previous visit (from the past 24 hour(s)).    Social History   Tobacco Use  Smoking Status Current Some Day Smoker  . Packs/day: 0.01  . Years: 53.00  . Pack years: 0.53  . Types: Cigarettes  . Start date: 08/10/1964  Smokeless Tobacco Never Used  Tobacco Comment   She is weaning herself down.     Goals Met:  Independence with exercise equipment Exercise tolerated well No report of cardiac concerns or symptoms Strength training completed today  Goals Unmet:  Not Applicable  Comments: Pt able to follow exercise prescription today without complaint.  Will continue to monitor for progression. Check out 1645.   Dr. Kate Sable is Medical Director for [facility] Cardiac and Pulmonary Rehab.

## 2018-02-06 ENCOUNTER — Encounter (HOSPITAL_COMMUNITY)
Admission: RE | Admit: 2018-02-06 | Discharge: 2018-02-06 | Disposition: A | Discharge status: Home / Self Care | Payer: BLUE CROSS/BLUE SHIELD | Source: Ambulatory Visit | Attending: Cardiology | Admitting: Cardiology

## 2018-02-06 DIAGNOSIS — I214 Non-ST elevation (NSTEMI) myocardial infarction: Secondary | ICD-10-CM

## 2018-02-06 DIAGNOSIS — Z7982 Long term (current) use of aspirin: Secondary | ICD-10-CM | POA: Diagnosis not present

## 2018-02-06 DIAGNOSIS — I2510 Atherosclerotic heart disease of native coronary artery without angina pectoris: Secondary | ICD-10-CM | POA: Diagnosis not present

## 2018-02-06 DIAGNOSIS — F17210 Nicotine dependence, cigarettes, uncomplicated: Secondary | ICD-10-CM | POA: Diagnosis not present

## 2018-02-06 DIAGNOSIS — E782 Mixed hyperlipidemia: Secondary | ICD-10-CM | POA: Diagnosis not present

## 2018-02-06 DIAGNOSIS — I10 Essential (primary) hypertension: Secondary | ICD-10-CM | POA: Diagnosis not present

## 2018-02-06 DIAGNOSIS — Z79899 Other long term (current) drug therapy: Secondary | ICD-10-CM | POA: Diagnosis not present

## 2018-02-06 DIAGNOSIS — Z955 Presence of coronary angioplasty implant and graft: Secondary | ICD-10-CM | POA: Diagnosis not present

## 2018-02-06 NOTE — Progress Notes (Signed)
Daily Session Note  Patient Details  Name: Stacy Douglas MRN: [id number] Date of Birth: 06-16-1950 Referring Provider:     [place] from 12/18/2017 in [place]  Referring Provider  Domenic Polite      Encounter Date: 02/06/2018  Check In: Session Check In - 02/06/18 1545      Check-In   Supervising physician immediately available to respond to emergencies  See telemetry face sheet for immediately available MD    Location  [facility]    Staff Present  Aundra Dubin, RN, Cory Munch, Exercise Physiologist    Medication changes reported      No    Fall or balance concerns reported     No    Tobacco Cessation  No Change    Warm-up and Cool-down  Performed as group-led instruction    Resistance Training Performed  Yes    VAD Patient?  No    PAD/SET Patient?  No      Pain Assessment   Currently in Pain?  No/denies    Pain Score  0-No pain    Multiple Pain Sites  No       Capillary Blood Glucose: No results found for this or any previous visit (from the past 24 hour(s)).    Social History   Tobacco Use  Smoking Status Current Some Day Smoker  . Packs/day: 0.01  . Years: 53.00  . Pack years: 0.53  . Types: Cigarettes  . Start date: 08/10/1964  Smokeless Tobacco Never Used  Tobacco Comment   She is weaning herself down.     Goals Met:  Independence with exercise equipment Exercise tolerated well No report of cardiac concerns or symptoms Strength training completed today  Goals Unmet:  Not Applicable  Comments: Pt able to follow exercise prescription today without complaint.  Will continue to monitor for progression. Check out 1645.   Dr. Kate Sable is Medical Director for [facility] Cardiac and Pulmonary Rehab.

## 2018-02-09 ENCOUNTER — Encounter (HOSPITAL_COMMUNITY)
Admission: RE | Admit: 2018-02-09 | Discharge: 2018-02-09 | Disposition: A | Discharge status: Home / Self Care | Payer: BLUE CROSS/BLUE SHIELD | Source: Ambulatory Visit | Attending: Cardiology | Admitting: Cardiology

## 2018-02-09 DIAGNOSIS — I214 Non-ST elevation (NSTEMI) myocardial infarction: Secondary | ICD-10-CM | POA: Diagnosis not present

## 2018-02-09 DIAGNOSIS — I2510 Atherosclerotic heart disease of native coronary artery without angina pectoris: Secondary | ICD-10-CM | POA: Diagnosis not present

## 2018-02-09 DIAGNOSIS — Z955 Presence of coronary angioplasty implant and graft: Secondary | ICD-10-CM

## 2018-02-09 DIAGNOSIS — F17210 Nicotine dependence, cigarettes, uncomplicated: Secondary | ICD-10-CM | POA: Diagnosis not present

## 2018-02-09 DIAGNOSIS — I10 Essential (primary) hypertension: Secondary | ICD-10-CM | POA: Diagnosis not present

## 2018-02-09 DIAGNOSIS — E782 Mixed hyperlipidemia: Secondary | ICD-10-CM | POA: Diagnosis not present

## 2018-02-09 DIAGNOSIS — Z7982 Long term (current) use of aspirin: Secondary | ICD-10-CM | POA: Diagnosis not present

## 2018-02-09 DIAGNOSIS — Z79899 Other long term (current) drug therapy: Secondary | ICD-10-CM | POA: Diagnosis not present

## 2018-02-09 NOTE — Progress Notes (Addendum)
Daily Session Note  Patient Details  Name: Stacy Douglas MRN: [id number] Date of Birth: 12-17-1950 Referring Provider:     [place] from 12/18/2017 in [place]  Referring Provider  Domenic Polite      Encounter Date: 02/09/2018  Check In: Session Check In - 02/09/18 1545      Check-In   Supervising physician immediately available to respond to emergencies  See telemetry face sheet for immediately available MD    Location  [facility]    Staff Present  Aundra Dubin, RN, Cory Munch, Exercise Physiologist    Medication changes reported      No    Fall or balance concerns reported     No    Tobacco Cessation  No Change    Warm-up and Cool-down  Performed as group-led instruction    Resistance Training Performed  Yes    VAD Patient?  No    PAD/SET Patient?  No      Pain Assessment   Currently in Pain?  No/denies    Pain Score  0-No pain    Multiple Pain Sites  No       Capillary Blood Glucose: No results found for this or any previous visit (from the past 24 hour(s)).    Social History   Tobacco Use  Smoking Status Current Some Day Smoker  . Packs/day: 0.01  . Years: 53.00  . Pack years: 0.53  . Types: Cigarettes  . Start date: 08/10/1964  Smokeless Tobacco Never Used  Tobacco Comment   She is weaning herself down.     Goals Met:  Independence with exercise equipment Exercise tolerated well No report of cardiac concerns or symptoms Strength training completed today  Goals Unmet:  Not Applicable  Comments: Pt able to follow exercise prescription today without complaint.  Will continue to monitor for progression. Check out 1645.   Dr. Kate Sable is Medical Director for [facility] Cardiac and Pulmonary Rehab.

## 2018-02-11 ENCOUNTER — Encounter (HOSPITAL_COMMUNITY)
Admission: RE | Admit: 2018-02-11 | Discharge: 2018-02-11 | Disposition: A | Discharge status: Home / Self Care | Payer: BLUE CROSS/BLUE SHIELD | Source: Ambulatory Visit | Attending: Cardiology | Admitting: Cardiology

## 2018-02-11 DIAGNOSIS — I214 Non-ST elevation (NSTEMI) myocardial infarction: Secondary | ICD-10-CM | POA: Diagnosis not present

## 2018-02-11 DIAGNOSIS — Z955 Presence of coronary angioplasty implant and graft: Secondary | ICD-10-CM | POA: Diagnosis not present

## 2018-02-11 DIAGNOSIS — F17210 Nicotine dependence, cigarettes, uncomplicated: Secondary | ICD-10-CM | POA: Diagnosis not present

## 2018-02-11 DIAGNOSIS — I2510 Atherosclerotic heart disease of native coronary artery without angina pectoris: Secondary | ICD-10-CM | POA: Diagnosis not present

## 2018-02-11 DIAGNOSIS — Z79899 Other long term (current) drug therapy: Secondary | ICD-10-CM | POA: Diagnosis not present

## 2018-02-11 DIAGNOSIS — E782 Mixed hyperlipidemia: Secondary | ICD-10-CM | POA: Diagnosis not present

## 2018-02-11 DIAGNOSIS — I10 Essential (primary) hypertension: Secondary | ICD-10-CM | POA: Diagnosis not present

## 2018-02-11 DIAGNOSIS — Z7982 Long term (current) use of aspirin: Secondary | ICD-10-CM | POA: Diagnosis not present

## 2018-02-11 NOTE — Progress Notes (Signed)
Daily Session Note  Patient Details  Name: Stacy Douglas MRN: [id number] Date of Birth: [birth year] Referring Provider:     [place] from 12/18/2017 in [place]  Referring Provider  Domenic Polite      Encounter Date: 02/11/2018  Check In: Session Check In - 02/11/18 1545      Check-In   Supervising physician immediately available to respond to emergencies  See telemetry face sheet for immediately available MD    Location  [facility]    Staff Present  Aundra Dubin, RN, Cory Munch, Exercise Physiologist    Medication changes reported      No    Fall or balance concerns reported     No    Warm-up and Cool-down  Performed as group-led instruction    Resistance Training Performed  Yes    VAD Patient?  No    PAD/SET Patient?  No      Pain Assessment   Currently in Pain?  No/denies    Pain Score  0-No pain    Multiple Pain Sites  No       Capillary Blood Glucose: No results found for this or any previous visit (from the past 24 hour(s)).    Social History   Tobacco Use  Smoking Status Current Some Day Smoker  . Packs/day: 0.01  . Years: 53.00  . Pack years: 0.53  . Types: Cigarettes  . Start date: 08/10/1964  Smokeless Tobacco Never Used  Tobacco Comment   She is weaning herself down.     Goals Met:  Independence with exercise equipment Exercise tolerated well No report of cardiac concerns or symptoms Strength training completed today  Goals Unmet:  Not Applicable  Comments: Pt able to follow exercise prescription today without complaint.  Will continue to monitor for progression. Check out 1645.   Dr. Kate Sable is Medical Director for [facility] Cardiac and Pulmonary Rehab.

## 2018-02-13 ENCOUNTER — Encounter (HOSPITAL_COMMUNITY)
Admission: RE | Admit: 2018-02-13 | Discharge: 2018-02-13 | Disposition: A | Discharge status: Home / Self Care | Payer: BLUE CROSS/BLUE SHIELD | Source: Ambulatory Visit | Attending: Cardiology | Admitting: Cardiology

## 2018-02-13 DIAGNOSIS — Z955 Presence of coronary angioplasty implant and graft: Secondary | ICD-10-CM

## 2018-02-13 DIAGNOSIS — Z79899 Other long term (current) drug therapy: Secondary | ICD-10-CM | POA: Diagnosis not present

## 2018-02-13 DIAGNOSIS — I10 Essential (primary) hypertension: Secondary | ICD-10-CM | POA: Diagnosis not present

## 2018-02-13 DIAGNOSIS — E782 Mixed hyperlipidemia: Secondary | ICD-10-CM | POA: Diagnosis not present

## 2018-02-13 DIAGNOSIS — F17210 Nicotine dependence, cigarettes, uncomplicated: Secondary | ICD-10-CM | POA: Diagnosis not present

## 2018-02-13 DIAGNOSIS — I2510 Atherosclerotic heart disease of native coronary artery without angina pectoris: Secondary | ICD-10-CM | POA: Diagnosis not present

## 2018-02-13 DIAGNOSIS — I214 Non-ST elevation (NSTEMI) myocardial infarction: Secondary | ICD-10-CM

## 2018-02-13 DIAGNOSIS — Z7982 Long term (current) use of aspirin: Secondary | ICD-10-CM | POA: Diagnosis not present

## 2018-02-13 NOTE — Progress Notes (Signed)
Daily Session Note  Patient Details  Name: Stacy Douglas MRN: [id number] Date of Birth: [birth year] Referring Provider:     [place] from 12/18/2017 in [place]  Referring Provider  Domenic Polite      Encounter Date: 02/13/2018  Check In: Session Check In - 02/13/18 1545      Check-In   Supervising physician immediately available to respond to emergencies  See telemetry face sheet for immediately available MD    Location  [facility]    Staff Present  Aundra Dubin, RN, Cory Munch, Exercise Physiologist    Medication changes reported      No    Fall or balance concerns reported     No    Tobacco Cessation  No Change    Warm-up and Cool-down  Performed as group-led instruction    Resistance Training Performed  Yes    VAD Patient?  No    PAD/SET Patient?  No      Pain Assessment   Currently in Pain?  No/denies    Pain Score  0-No pain    Multiple Pain Sites  No       Capillary Blood Glucose: No results found for this or any previous visit (from the past 24 hour(s)).    Social History   Tobacco Use  Smoking Status Current Some Day Smoker  . Packs/day: 0.01  . Years: 53.00  . Pack years: 0.53  . Types: Cigarettes  . Start date: 08/10/1964  Smokeless Tobacco Never Used  Tobacco Comment   She is weaning herself down.     Goals Met:  Independence with exercise equipment Exercise tolerated well No report of cardiac concerns or symptoms Strength training completed today  Goals Unmet:  Not Applicable  Comments: Pt able to follow exercise prescription today without complaint.  Will continue to monitor for progression. Check out 1645.   Dr. Kate Sable is Medical Director for [facility] Cardiac and Pulmonary Rehab.

## 2018-02-16 ENCOUNTER — Encounter (HOSPITAL_COMMUNITY)
Admission: RE | Admit: 2018-02-16 | Discharge: 2018-02-16 | Disposition: A | Discharge status: Home / Self Care | Payer: BLUE CROSS/BLUE SHIELD | Source: Ambulatory Visit | Attending: Cardiology | Admitting: Cardiology

## 2018-02-16 DIAGNOSIS — Z7982 Long term (current) use of aspirin: Secondary | ICD-10-CM | POA: Diagnosis not present

## 2018-02-16 DIAGNOSIS — Z955 Presence of coronary angioplasty implant and graft: Secondary | ICD-10-CM | POA: Diagnosis not present

## 2018-02-16 DIAGNOSIS — I2510 Atherosclerotic heart disease of native coronary artery without angina pectoris: Secondary | ICD-10-CM | POA: Diagnosis not present

## 2018-02-16 DIAGNOSIS — I214 Non-ST elevation (NSTEMI) myocardial infarction: Secondary | ICD-10-CM

## 2018-02-16 DIAGNOSIS — I10 Essential (primary) hypertension: Secondary | ICD-10-CM | POA: Diagnosis not present

## 2018-02-16 DIAGNOSIS — E782 Mixed hyperlipidemia: Secondary | ICD-10-CM | POA: Diagnosis not present

## 2018-02-16 DIAGNOSIS — F17210 Nicotine dependence, cigarettes, uncomplicated: Secondary | ICD-10-CM | POA: Diagnosis not present

## 2018-02-16 DIAGNOSIS — Z79899 Other long term (current) drug therapy: Secondary | ICD-10-CM | POA: Diagnosis not present

## 2018-02-16 NOTE — Progress Notes (Signed)
Daily Session Note  Patient Details  Name: Stacy Douglas MRN: [id number] Date of Birth: 1951-01-13 Referring Provider:     [place] from 12/18/2017 in [place]  Referring Provider  Domenic Polite      Encounter Date: 02/16/2018  Check In: Session Check In - 02/16/18 1545      Check-In   Supervising physician immediately available to respond to emergencies  See telemetry face sheet for immediately available MD    Location  [facility]    Staff Present  Russella Dar, MS, EP, [facility], Exercise Physiologist;Quint Chestnut Zachery Conch, Exercise Physiologist    Medication changes reported      No    Fall or balance concerns reported     No    Tobacco Cessation  No Change    Warm-up and Cool-down  Performed as group-led instruction    Resistance Training Performed  Yes    VAD Patient?  No    PAD/SET Patient?  No      Pain Assessment   Currently in Pain?  No/denies    Pain Score  0-No pain    Multiple Pain Sites  No       Capillary Blood Glucose: No results found for this or any previous visit (from the past 24 hour(s)).    Social History   Tobacco Use  Smoking Status Current Some Day Smoker  . Packs/day: 0.01  . Years: 53.00  . Pack years: 0.53  . Types: Cigarettes  . Start date: 08/10/1964  Smokeless Tobacco Never Used  Tobacco Comment   She is weaning herself down.     Goals Met:  Proper associated with RPD/PD & O2 Sat Independence with exercise equipment Exercise tolerated well No report of cardiac concerns or symptoms Strength training completed today  Goals Unmet:  Not Applicable  Comments: Pt able to follow exercise prescription today without complaint.  Will continue to monitor for progression. Check out 4:45.   Dr. Kate Sable is Medical Director for [facility] Cardiac and Pulmonary Rehab.

## 2018-02-18 ENCOUNTER — Other Ambulatory Visit: Payer: Self-pay | Admitting: Student

## 2018-02-18 ENCOUNTER — Encounter (HOSPITAL_COMMUNITY)
Admission: RE | Admit: 2018-02-18 | Discharge: 2018-02-18 | Disposition: A | Discharge status: Home / Self Care | Payer: BLUE CROSS/BLUE SHIELD | Source: Ambulatory Visit | Attending: Cardiology | Admitting: Cardiology

## 2018-02-18 DIAGNOSIS — Z955 Presence of coronary angioplasty implant and graft: Secondary | ICD-10-CM

## 2018-02-18 DIAGNOSIS — E782 Mixed hyperlipidemia: Secondary | ICD-10-CM | POA: Diagnosis not present

## 2018-02-18 DIAGNOSIS — I214 Non-ST elevation (NSTEMI) myocardial infarction: Secondary | ICD-10-CM

## 2018-02-18 DIAGNOSIS — I10 Essential (primary) hypertension: Secondary | ICD-10-CM | POA: Diagnosis not present

## 2018-02-18 DIAGNOSIS — F17210 Nicotine dependence, cigarettes, uncomplicated: Secondary | ICD-10-CM | POA: Diagnosis not present

## 2018-02-18 DIAGNOSIS — Z7982 Long term (current) use of aspirin: Secondary | ICD-10-CM | POA: Diagnosis not present

## 2018-02-18 DIAGNOSIS — Z79899 Other long term (current) drug therapy: Secondary | ICD-10-CM | POA: Diagnosis not present

## 2018-02-18 DIAGNOSIS — I2510 Atherosclerotic heart disease of native coronary artery without angina pectoris: Secondary | ICD-10-CM | POA: Diagnosis not present

## 2018-02-18 NOTE — Progress Notes (Signed)
Daily Session Note  Patient Details  Name: Stacy Douglas MRN: [id number] Date of Birth: 30-Jun-1950 Referring Provider:     [place] from 12/18/2017 in [place]  Referring Provider  Domenic Polite      Encounter Date: 02/18/2018  Check In: Session Check In - 02/18/18 1545      Check-In   Supervising physician immediately available to respond to emergencies  See telemetry face sheet for immediately available MD    Location  [facility]    Staff Present  Russella Dar, MS, EP, [facility], Exercise Physiologist;Mithra Spano Zachery Conch, Exercise Physiologist    Medication changes reported      No    Fall or balance concerns reported     No    Tobacco Cessation  No Change    Warm-up and Cool-down  Performed as group-led instruction    Resistance Training Performed  Yes    VAD Patient?  No    PAD/SET Patient?  No      Pain Assessment   Currently in Pain?  No/denies    Pain Score  0-No pain    Multiple Pain Sites  No       Capillary Blood Glucose: No results found for this or any previous visit (from the past 24 hour(s)).    Social History   Tobacco Use  Smoking Status Current Some Day Smoker  . Packs/day: 0.01  . Years: 53.00  . Pack years: 0.53  . Types: Cigarettes  . Start date: 08/10/1964  Smokeless Tobacco Never Used  Tobacco Comment   She is weaning herself down.     Goals Met:  Proper associated with RPD/PD & O2 Sat Independence with exercise equipment Exercise tolerated well No report of cardiac concerns or symptoms Strength training completed today  Goals Unmet:  Not Applicable  Comments: Pt able to follow exercise prescription today without complaint.  Will continue to monitor for progression. Check out 4:45   Dr. Kate Sable is Medical Director for [facility] Cardiac and Pulmonary Rehab.

## 2018-02-20 ENCOUNTER — Encounter (HOSPITAL_COMMUNITY)
Admission: RE | Admit: 2018-02-20 | Discharge: 2018-02-20 | Disposition: A | Discharge status: Home / Self Care | Payer: BLUE CROSS/BLUE SHIELD | Source: Ambulatory Visit | Attending: Cardiology | Admitting: Cardiology

## 2018-02-20 DIAGNOSIS — I10 Essential (primary) hypertension: Secondary | ICD-10-CM | POA: Diagnosis not present

## 2018-02-20 DIAGNOSIS — Z7982 Long term (current) use of aspirin: Secondary | ICD-10-CM | POA: Diagnosis not present

## 2018-02-20 DIAGNOSIS — Z955 Presence of coronary angioplasty implant and graft: Secondary | ICD-10-CM

## 2018-02-20 DIAGNOSIS — I2510 Atherosclerotic heart disease of native coronary artery without angina pectoris: Secondary | ICD-10-CM | POA: Diagnosis not present

## 2018-02-20 DIAGNOSIS — Z79899 Other long term (current) drug therapy: Secondary | ICD-10-CM | POA: Diagnosis not present

## 2018-02-20 DIAGNOSIS — E782 Mixed hyperlipidemia: Secondary | ICD-10-CM | POA: Diagnosis not present

## 2018-02-20 DIAGNOSIS — F17210 Nicotine dependence, cigarettes, uncomplicated: Secondary | ICD-10-CM | POA: Diagnosis not present

## 2018-02-20 DIAGNOSIS — I214 Non-ST elevation (NSTEMI) myocardial infarction: Secondary | ICD-10-CM

## 2018-02-20 NOTE — Progress Notes (Signed)
Daily Session Note  Patient Details  Name: Stacy Douglas MRN: [id number] Date of Birth: November 12, 1950 Referring Provider:     [place] from 12/18/2017 in [place]  Referring Provider  Domenic Polite      Encounter Date: 02/20/2018  Check In: Session Check In - 02/20/18 1545      Check-In   Supervising physician immediately available to respond to emergencies  See telemetry face sheet for immediately available MD    Location  [facility]    Staff Present  Benay Pike, Exercise Physiologist;Debra Wynetta Emery, RN, BSN    Medication changes reported      No    Fall or balance concerns reported     No    Tobacco Cessation  No Change    Warm-up and Cool-down  Performed as group-led instruction    Resistance Training Performed  Yes    VAD Patient?  No    PAD/SET Patient?  No      Pain Assessment   Currently in Pain?  No/denies    Pain Score  0-No pain    Multiple Pain Sites  No       Capillary Blood Glucose: No results found for this or any previous visit (from the past 24 hour(s)).    Social History   Tobacco Use  Smoking Status Current Some Day Smoker  . Packs/day: 0.01  . Years: 53.00  . Pack years: 0.53  . Types: Cigarettes  . Start date: 08/10/1964  Smokeless Tobacco Never Used  Tobacco Comment   She is weaning herself down.     Goals Met:  Independence with exercise equipment Exercise tolerated well No report of cardiac concerns or symptoms Strength training completed today  Goals Unmet:  Not Applicable  Comments: Pt able to follow exercise prescription today without complaint.  Will continue to monitor for progression. Check out 4:45.   Dr. Kate Sable is Medical Director for [facility] Cardiac and Pulmonary Rehab.

## 2018-02-23 ENCOUNTER — Encounter (HOSPITAL_COMMUNITY)
Admission: RE | Admit: 2018-02-23 | Discharge: 2018-02-23 | Disposition: A | Discharge status: Home / Self Care | Payer: BLUE CROSS/BLUE SHIELD | Source: Ambulatory Visit | Attending: Cardiology | Admitting: Cardiology

## 2018-02-23 DIAGNOSIS — Z955 Presence of coronary angioplasty implant and graft: Secondary | ICD-10-CM | POA: Diagnosis not present

## 2018-02-23 DIAGNOSIS — Z79899 Other long term (current) drug therapy: Secondary | ICD-10-CM | POA: Diagnosis not present

## 2018-02-23 DIAGNOSIS — E782 Mixed hyperlipidemia: Secondary | ICD-10-CM | POA: Diagnosis not present

## 2018-02-23 DIAGNOSIS — F17210 Nicotine dependence, cigarettes, uncomplicated: Secondary | ICD-10-CM | POA: Diagnosis not present

## 2018-02-23 DIAGNOSIS — I214 Non-ST elevation (NSTEMI) myocardial infarction: Secondary | ICD-10-CM | POA: Diagnosis not present

## 2018-02-23 DIAGNOSIS — Z7982 Long term (current) use of aspirin: Secondary | ICD-10-CM | POA: Diagnosis not present

## 2018-02-23 DIAGNOSIS — I2510 Atherosclerotic heart disease of native coronary artery without angina pectoris: Secondary | ICD-10-CM | POA: Diagnosis not present

## 2018-02-23 DIAGNOSIS — I10 Essential (primary) hypertension: Secondary | ICD-10-CM | POA: Diagnosis not present

## 2018-02-23 NOTE — Progress Notes (Signed)
Daily Session Note  Patient Details  Name: Stacy Douglas MRN: [id number] Date of Birth: [birth year] Referring Provider:     [place] from 12/18/2017 in [place]  Referring Provider  Domenic Polite      Encounter Date: 02/23/2018  Check In: Session Check In - 02/23/18 1545      Check-In   Supervising physician immediately available to respond to emergencies  See telemetry face sheet for immediately available MD    Location  [facility]    Staff Present  Russella Dar, MS, EP, [facility], Exercise Physiologist;Amanda Ballard, Exercise Physiologist;Debra Wynetta Emery, RN, BSN    Medication changes reported      No    Fall or balance concerns reported     No    Tobacco Cessation  No Change    Warm-up and Cool-down  Performed as group-led instruction    Resistance Training Performed  Yes    VAD Patient?  No    PAD/SET Patient?  No      Pain Assessment   Currently in Pain?  No/denies    Pain Score  0-No pain    Multiple Pain Sites  No       Capillary Blood Glucose: No results found for this or any previous visit (from the past 24 hour(s)).    Social History   Tobacco Use  Smoking Status Current Some Day Smoker  . Packs/day: 0.01  . Years: 53.00  . Pack years: 0.53  . Types: Cigarettes  . Start date: 08/10/1964  Smokeless Tobacco Never Used  Tobacco Comment   She is weaning herself down.     Goals Met:  Independence with exercise equipment Exercise tolerated well Personal goals reviewed No report of cardiac concerns or symptoms Strength training completed today  Goals Unmet:  Not Applicable  Comments: Check out: 4:45   Dr. Kate Sable is Medical Director for [place] and Pulmonary Rehab.

## 2018-02-25 ENCOUNTER — Encounter (HOSPITAL_COMMUNITY)
Admission: RE | Admit: 2018-02-25 | Discharge: 2018-02-25 | Disposition: A | Discharge status: Home / Self Care | Payer: BLUE CROSS/BLUE SHIELD | Source: Ambulatory Visit | Attending: Cardiology | Admitting: Cardiology

## 2018-02-25 DIAGNOSIS — F17210 Nicotine dependence, cigarettes, uncomplicated: Secondary | ICD-10-CM | POA: Diagnosis not present

## 2018-02-25 DIAGNOSIS — Z7982 Long term (current) use of aspirin: Secondary | ICD-10-CM | POA: Diagnosis not present

## 2018-02-25 DIAGNOSIS — I10 Essential (primary) hypertension: Secondary | ICD-10-CM | POA: Diagnosis not present

## 2018-02-25 DIAGNOSIS — Z955 Presence of coronary angioplasty implant and graft: Secondary | ICD-10-CM | POA: Diagnosis not present

## 2018-02-25 DIAGNOSIS — E782 Mixed hyperlipidemia: Secondary | ICD-10-CM | POA: Diagnosis not present

## 2018-02-25 DIAGNOSIS — Z79899 Other long term (current) drug therapy: Secondary | ICD-10-CM | POA: Diagnosis not present

## 2018-02-25 DIAGNOSIS — I2510 Atherosclerotic heart disease of native coronary artery without angina pectoris: Secondary | ICD-10-CM | POA: Diagnosis not present

## 2018-02-25 DIAGNOSIS — I214 Non-ST elevation (NSTEMI) myocardial infarction: Secondary | ICD-10-CM | POA: Diagnosis not present

## 2018-02-25 NOTE — Progress Notes (Signed)
Daily Session Note  Patient Details  Name: Stacy Douglas MRN: [id number] Date of Birth: [birth year] Referring Provider:     [place] from 12/18/2017 in [place]  Referring Provider  Domenic Polite      Encounter Date: 02/25/2018  Check In: Session Check In - 02/25/18 1545      Check-In   Supervising physician immediately available to respond to emergencies  See telemetry face sheet for immediately available MD    Location  [facility]    Staff Present  Russella Dar, MS, EP, [facility], Exercise Physiologist;Sheriece Jefcoat, Exercise Physiologist;Debra Wynetta Emery, RN, BSN    Medication changes reported      No    Fall or balance concerns reported     No    Tobacco Cessation  No Change    Warm-up and Cool-down  Performed as group-led instruction    Resistance Training Performed  Yes    VAD Patient?  No    PAD/SET Patient?  No      Pain Assessment   Currently in Pain?  No/denies    Pain Score  0-No pain    Multiple Pain Sites  No       Capillary Blood Glucose: No results found for this or any previous visit (from the past 24 hour(s)).    Social History   Tobacco Use  Smoking Status Current Some Day Smoker  . Packs/day: 0.01  . Years: 53.00  . Pack years: 0.53  . Types: Cigarettes  . Start date: 08/10/1964  Smokeless Tobacco Never Used  Tobacco Comment   She is weaning herself down.     Goals Met:  Independence with exercise equipment Exercise tolerated well No report of cardiac concerns or symptoms Strength training completed today  Goals Unmet:  Not Applicable  Comments: Pt able to follow exercise prescription today without complaint.  Will continue to monitor for progression. Check out 4:45.   Dr. Kate Sable is Medical Director for [facility] Cardiac and Pulmonary Rehab.

## 2018-02-27 ENCOUNTER — Encounter (HOSPITAL_COMMUNITY)
Admission: RE | Admit: 2018-02-27 | Discharge: 2018-02-27 | Disposition: A | Discharge status: Home / Self Care | Payer: BLUE CROSS/BLUE SHIELD | Source: Ambulatory Visit | Attending: Cardiology | Admitting: Cardiology

## 2018-02-27 DIAGNOSIS — Z7982 Long term (current) use of aspirin: Secondary | ICD-10-CM | POA: Diagnosis not present

## 2018-02-27 DIAGNOSIS — E782 Mixed hyperlipidemia: Secondary | ICD-10-CM | POA: Insufficient documentation

## 2018-02-27 DIAGNOSIS — I214 Non-ST elevation (NSTEMI) myocardial infarction: Secondary | ICD-10-CM | POA: Diagnosis not present

## 2018-02-27 DIAGNOSIS — I2510 Atherosclerotic heart disease of native coronary artery without angina pectoris: Secondary | ICD-10-CM | POA: Insufficient documentation

## 2018-02-27 DIAGNOSIS — Z79899 Other long term (current) drug therapy: Secondary | ICD-10-CM | POA: Insufficient documentation

## 2018-02-27 DIAGNOSIS — Z955 Presence of coronary angioplasty implant and graft: Secondary | ICD-10-CM

## 2018-02-27 DIAGNOSIS — F17210 Nicotine dependence, cigarettes, uncomplicated: Secondary | ICD-10-CM | POA: Insufficient documentation

## 2018-02-27 DIAGNOSIS — I10 Essential (primary) hypertension: Secondary | ICD-10-CM | POA: Insufficient documentation

## 2018-02-27 NOTE — Progress Notes (Addendum)
Daily Session Note  Patient Details  Name: Stacy Douglas MRN: [id number] Date of Birth: 02-Aug-1950 Referring Provider:     [place] from 12/18/2017 in [place]  Referring Provider  Domenic Polite      Encounter Date: 02/27/2018  Check In: Session Check In - 02/27/18 1545      Check-In   Supervising physician immediately available to respond to emergencies  See telemetry face sheet for immediately available MD    Location  [facility]    Staff Present  Russella Dar, MS, EP, [facility], Exercise Physiologist;Deboraha Goar Wynetta Emery, RN, BSN    Medication changes reported      No    Fall or balance concerns reported     No    Tobacco Cessation  No Change    Warm-up and Cool-down  Performed as group-led instruction    Resistance Training Performed  Yes    VAD Patient?  No    PAD/SET Patient?  No      Pain Assessment   Currently in Pain?  No/denies    Pain Score  0-No pain    Multiple Pain Sites  No       Capillary Blood Glucose: No results found for this or any previous visit (from the past 24 hour(s)).    Social History   Tobacco Use  Smoking Status Current Some Day Smoker  . Packs/day: 0.01  . Years: 53.00  . Pack years: 0.53  . Types: Cigarettes  . Start date: 08/10/1964  Smokeless Tobacco Never Used  Tobacco Comment   She is weaning herself down.     Goals Met:  Independence with exercise equipment Exercise tolerated well No report of cardiac concerns or symptoms Strength training completed today  Goals Unmet:  Not Applicable  Comments: Pt able to follow exercise prescription today without complaint.  Will continue to monitor for progression. Check out 1645.   Dr. Kate Sable is Medical Director for [facility] Cardiac and Pulmonary Rehab.

## 2018-03-02 ENCOUNTER — Encounter (HOSPITAL_COMMUNITY)
Admission: RE | Admit: 2018-03-02 | Discharge: 2018-03-02 | Disposition: A | Discharge status: Home / Self Care | Payer: BLUE CROSS/BLUE SHIELD | Source: Ambulatory Visit | Attending: Cardiology | Admitting: Cardiology

## 2018-03-02 VITALS — Ht 64.0 in | Wt 197.3 lb

## 2018-03-02 DIAGNOSIS — I214 Non-ST elevation (NSTEMI) myocardial infarction: Secondary | ICD-10-CM

## 2018-03-02 DIAGNOSIS — Z955 Presence of coronary angioplasty implant and graft: Secondary | ICD-10-CM | POA: Diagnosis not present

## 2018-03-02 DIAGNOSIS — Z7982 Long term (current) use of aspirin: Secondary | ICD-10-CM | POA: Diagnosis not present

## 2018-03-02 DIAGNOSIS — E782 Mixed hyperlipidemia: Secondary | ICD-10-CM | POA: Diagnosis not present

## 2018-03-02 DIAGNOSIS — I10 Essential (primary) hypertension: Secondary | ICD-10-CM | POA: Diagnosis not present

## 2018-03-02 DIAGNOSIS — F17210 Nicotine dependence, cigarettes, uncomplicated: Secondary | ICD-10-CM | POA: Diagnosis not present

## 2018-03-02 DIAGNOSIS — Z79899 Other long term (current) drug therapy: Secondary | ICD-10-CM | POA: Diagnosis not present

## 2018-03-02 DIAGNOSIS — I2510 Atherosclerotic heart disease of native coronary artery without angina pectoris: Secondary | ICD-10-CM | POA: Diagnosis not present

## 2018-03-02 NOTE — Progress Notes (Signed)
Daily Session Note  Patient Details  Name: Stacy Douglas MRN: [id number] Date of Birth: [birth year] Referring Provider:     [place] from 12/18/2017 in [place]  Referring Provider  Domenic Polite      Encounter Date: 03/02/2018  Check In: Session Check In - 03/02/18 1544      Check-In   Supervising physician immediately available to respond to emergencies  See telemetry face sheet for immediately available MD    Location  [facility]    Staff Present  Russella Dar, MS, EP, [facility], Exercise Physiologist;Liley Rake Wynetta Emery, RN, Cory Munch, Exercise Physiologist    Medication changes reported      No    Fall or balance concerns reported     No    Tobacco Cessation  No Change    Warm-up and Cool-down  Performed as group-led instruction    Resistance Training Performed  Yes    VAD Patient?  No    PAD/SET Patient?  No      Pain Assessment   Currently in Pain?  No/denies    Pain Score  0-No pain    Multiple Pain Sites  No       Capillary Blood Glucose: No results found for this or any previous visit (from the past 24 hour(s)).    Social History   Tobacco Use  Smoking Status Current Some Day Smoker  . Packs/day: 0.01  . Years: 53.00  . Pack years: 0.53  . Types: Cigarettes  . Start date: 08/10/1964  Smokeless Tobacco Never Used  Tobacco Comment   She is weaning herself down.     Goals Met:  Independence with exercise equipment Exercise tolerated well No report of cardiac concerns or symptoms Strength training completed today  Goals Unmet:  Not Applicable  Comments: Pt able to follow exercise prescription today without complaint.  Will continue to monitor for progression. Check out 1645.   Dr. Kate Sable is Medical Director for [facility] Cardiac and Pulmonary Rehab.

## 2018-03-04 ENCOUNTER — Encounter (HOSPITAL_COMMUNITY): Payer: BLUE CROSS/BLUE SHIELD

## 2018-03-04 NOTE — Progress Notes (Signed)
Cardiac Individual Treatment Plan  Patient Details  Name: Stacy Douglas MRN: [id number] Date of Birth: 1950-09-24 Referring Provider:     [place] from 12/18/2017 in [place]  Referring Provider  Domenic Polite      Initial Encounter Date:    CARDIAC REHAB PHASE II ORIENTATION from 12/18/2017 in [place]  Date  12/18/17      Visit Diagnosis: NSTEMI (non-ST elevated myocardial infarction) [facility])  Status post coronary artery stent placement  Patient's Home Medications on Admission:  Current Outpatient Medications:  .  aspirin EC 81 MG tablet, Take 81 mg by mouth daily., Disp: , Rfl:  .  metoprolol succinate (TOPROL XL) 25 MG 24 hr tablet, Take 0.5 tablets (12.5 mg total) by mouth daily., Disp: 45 tablet, Rfl: 3 .  nitroGLYCERIN (NITROSTAT) 0.4 MG SL tablet, Place 1 tablet (0.4 mg total) under the tongue every 5 (five) minutes x 3 doses as needed., Disp: 25 tablet, Rfl: 3 .  prasugrel (EFFIENT) 10 MG TABS tablet, TAKE 1 TABLET BY MOUTH ONCE DAILY, Disp: 90 tablet, Rfl: 1  Past Medical History: Past Medical History:  Diagnosis Date  . Coronary atherosclerosis of native coronary artery    a. VF arrest in [place] s/p BMS LAD 5/11, PTCA circumflex 6/11, LVEF 40%. b. [place] 10/2014 s/p DES to prox and mid RCA, otherwise patent LAD stent and moderate disease in the Cx, LVEF 55-65%. c. low-risk NST in 06/2017 d. 09/2017: NSTEMI with cath showing 99% mid-LCx stenosis --> treated with DES; patent stents along LAD and RCA.   Marland Kitchen DVT (deep venous thrombosis) (HCC) 1992   LLE  . Essential hypertension   . History of duodenal ulcer 1960s  . Mixed hyperlipidemia   . Myocardial infarction ([place]) 89/3810   complicated by VF arrest  . NSTEMI (non-ST elevated myocardial infarction) ([place]) 10/07/2017   Archie Endo 10/07/2017  . Obesity   . Plavix resistance   . Tobacco abuse     Tobacco Use: Social History   Tobacco Use  Smoking  Status Current Some Day Smoker  . Packs/day: 0.01  . Years: 53.00  . Pack years: 0.53  . Types: Cigarettes  . Start date: 08/10/1964  Smokeless Tobacco Never Used  Tobacco Comment   She is weaning herself down.     Labs: Recent Review Flowsheet Data    Labs for ITP Cardiac and Pulmonary Rehab Latest Ref Rng & Units 11/16/2014 03/04/2015 07/31/2016 10/08/2017 12/18/2017   Cholestrol <200 mg/dL 152 149 158 154 149   LDLCALC mg/dL (calc) 95 95 99 94 85   HDL >50 mg/dL 34(L) 41(L) 39(L) 37(L) 45(L)   Trlycerides <150 mg/dL 113 64 100 117 91   Hemoglobin A1c 4.8 - 5.6 % - - - - -      Capillary Blood Glucose: No results found for: GLUCAP   Exercise Target Goals: Exercise Program Goal: Individual exercise prescription set using results from initial 6 min walk test and THRR while considering  patient's activity barriers and safety.   Exercise Prescription Goal: Starting with aerobic activity 30 plus minutes a day, 3 days per week for initial exercise prescription. Provide home exercise prescription and guidelines that participant acknowledges understanding prior to discharge.  Activity Barriers & Risk Stratification: Activity Barriers & Cardiac Risk Stratification - 12/18/17 1609      Activity Barriers & Cardiac Risk Stratification   Activity Barriers  None    Cardiac Risk Stratification  High  6 Minute Walk: 6 Minute Walk    Row Name 12/18/17 1606 03/02/18 1620       6 Minute Walk   Phase  Initial  Discharge    Distance  1450 feet  1650 feet    Distance % Change  -  13.79 %    Distance Feet Change  -  200 ft    Walk Time  6 minutes  6 minutes    # of Rest Breaks  0  0    MPH  2.75  3.13    METS  3.1  3.4    RPE  13  11    Perceived Dyspnea   9  6    VO2 Peak  10.67  11.88    Symptoms  Yes (comment)  No    Comments  Had a little discomfort in (R) upper chest. Went away after 35mnute rest.  -    Resting HR  81 bpm  80 bpm    Resting BP  114/60  130/70    Resting  Oxygen Saturation   98 %  95 %    Exercise Oxygen Saturation  during 6 min walk  90 %  92 %    Max Ex. HR  96 bpm  97 bpm    Max Ex. BP  140/80  136/76    2 Minute Post BP  118/70  126/72       Oxygen Initial Assessment:   Oxygen Re-Evaluation:   Oxygen Discharge (Final Oxygen Re-Evaluation):   Initial Exercise Prescription: Initial Exercise Prescription - 12/18/17 1600      Date of Initial Exercise RX and Referring Provider   Date  12/18/17    Referring Provider  MDomenic Polite   Expected Discharge Date  03/20/18      Treadmill   MPH  1.7    Grade  0    Minutes  17    METs  2.3      Recumbant Elliptical   Level  1    RPM  43    Watts  46    Minutes  22    METs  2.7      Prescription Details   Frequency (times per week)  3    Duration  Progress to 30 minutes of continuous aerobic without signs/symptoms of physical distress      Intensity   THRR 40-80% of Max Heartrate  111-125-140    Ratings of Perceived Exertion  11-13    Perceived Dyspnea  0-4      Progression   Progression  Continue progressive overload as per policy without signs/symptoms or physical distress.      Resistance Training   Training Prescription  Yes    Weight  1    Reps  10-15       Perform Capillary Blood Glucose checks as needed.  Exercise Prescription Changes:  Exercise Prescription Changes    Row Name 01/01/18 1500 01/27/18 0800 02/17/18 0800         Response to Exercise   Blood Pressure (Admit)  122/70  126/60  118/72     Blood Pressure (Exercise)  142/68  130/70  140/64     Blood Pressure (Exit)  110/60  110/60  114/62     Heart Rate (Admit)  82 bpm  74 bpm  81 bpm     Heart Rate (Exercise)  109 bpm  108 bpm  114 bpm     Heart Rate (Exit)  91  bpm  79 bpm  89 bpm     Rating of Perceived Exertion (Exercise)  _0 Comments  -  -  increased overall MET level      Duration  Progress to 30 minutes of  aerobic without signs/symptoms of physical distress  Progress to 30  minutes of  aerobic without signs/symptoms of physical distress  Progress to 30 minutes of  aerobic without signs/symptoms of physical distress     Intensity  THRR New 111-125-140  THRR unchanged  THRR unchanged       Progression   Progression  Continue to progress workloads to maintain intensity without signs/symptoms of physical distress.  Continue to progress workloads to maintain intensity without signs/symptoms of physical distress.  Continue to progress workloads to maintain intensity without signs/symptoms of physical distress.     Average METs  3.08  3.44  4.19       Resistance Training   Training Prescription  Yes  Yes  Yes     Weight  _1 Reps  10-15  10-15  10-15       Treadmill   MPH  1.8  2.2  2.6     Grade  0  0  0     Minutes  _2 METs  2.37  2.68  2.98       Recumbant Elliptical   Level  _3 RPM  60  56  59     Watts  76  77  92     Minutes  _4 METs  2.7  4.2  5.4       Home Exercise Plan   Plans to continue exercise at  -  Home (comment) treadmill, hand weights   Home (comment)     Frequency  -  Add 2 additional days to program exercise sessions.  Add 2 additional days to program exercise sessions.     Initial Home Exercises Provided  -  01/05/18  01/05/18        Exercise Comments:  Exercise Comments    Row Name 01/01/18 1543 01/12/18 1325 02/02/18 1448 03/02/18 1455     Exercise Comments  Patient has been doing well so far in rehab. She has only had 5 visits to date. She has already shown to increase her MET level on the equipment as tolerated with progression.   Patient is doing well in the program thus far. She has tolerated the equipment and weight increases.   Continues to progress as tolerated. Increased overall MET level while increasing workloads.   Works hard to increase overall strength. She has increased her overall MET level and tolerated all progressions with ease.        Exercise Goals and  Review:  Exercise Goals    Row Name 12/18/17 1614             Exercise Goals   Able to understand and use rate of perceived exertion (RPE) scale  Yes       Intervention  Provide education and explanation on how to use RPE scale       Expected Outcomes  Short Term: Able to use RPE daily in rehab to express subjective intensity level;Long Term:  Able to use RPE to guide intensity level when exercising independently  Knowledge and understanding of Target Heart Rate Range (THRR)  Yes       Intervention  Provide education and explanation of THRR including how the numbers were predicted and where they are located for reference       Expected Outcomes  Short Term: Able to use daily as guideline for intensity in rehab;Long Term: Able to use THRR to govern intensity when exercising independently       Able to check pulse independently  Yes       Intervention  Provide education and demonstration on how to check pulse in carotid and radial arteries.       Expected Outcomes  Short Term: Able to explain why pulse checking is important during independent exercise;Long Term: Able to check pulse independently and accurately       Understanding of Exercise Prescription  Yes       Intervention  Provide education, explanation, and written materials on patient's individual exercise prescription       Expected Outcomes  Short Term: Able to explain program exercise prescription;Long Term: Able to explain home exercise prescription to exercise independently          Exercise Goals Re-Evaluation : Exercise Goals Re-Evaluation    Row Name 01/01/18 1539 01/12/18 1323 02/02/18 1444 03/02/18 1451       Exercise Goal Re-Evaluation   Exercise Goals Review  Increase Physical Activity;Understanding of Exercise Prescription  Increase Physical Activity;Increase Strength and Stamina;Understanding of Exercise Prescription  Increase Physical Activity;Increase Strength and Stamina;Understanding of Exercise  Prescription  Increase Physical Activity;Increase Strength and Stamina;Understanding of Exercise Prescription    Comments  Patient's most prioritized goal is to lose weight, she'd like to lose 40 pounds in a year. She has been working hard in rehab to increase her activity level which will aid in her weight loss.   Patient's most prioritized goal is to lose weight, she'd like to lose 40 pounds in a year. She has been working hard in rehab to increase her activity level which will aid in her weight loss.   Patient has done well in the program. She has increased her workloads on the equipment and weights, while increasing her overall MET level. She is still trying to lose weight but has seemed to hit a plateu but says she feels better since exercising.   Patient continues to do well and work hard in the program. She reports feeling stronger and being able to get out and do more with her grandchildren. She is losing weight, slowly but surely. She works hard and is always open to any increases in workloads.     Expected Outcomes  Patient will lose weight.   Patient ultimate goal is to lose weight.   Increase strength and stamina. Lose weight.   Lose weight and increase strength.         Discharge Exercise Prescription (Final Exercise Prescription Changes): Exercise Prescription Changes - 02/17/18 0800      Response to Exercise   Blood Pressure (Admit)  118/72    Blood Pressure (Exercise)  140/64    Blood Pressure (Exit)  114/62    Heart Rate (Admit)  81 bpm    Heart Rate (Exercise)  114 bpm    Heart Rate (Exit)  89 bpm    Rating of Perceived Exertion (Exercise)  12    Comments  increased overall MET level     Duration  Progress to 30 minutes of  aerobic without signs/symptoms of physical distress  Intensity  THRR unchanged      Progression   Progression  Continue to progress workloads to maintain intensity without signs/symptoms of physical distress.    Average METs  4.19      Resistance  Training   Training Prescription  Yes    Weight  3    Reps  10-15      Treadmill   MPH  2.6    Grade  0    Minutes  17    METs  2.98      Recumbant Elliptical   Level  3    RPM  59    Watts  92    Minutes  22    METs  5.4      Home Exercise Plan   Plans to continue exercise at  Home (comment)    Frequency  Add 2 additional days to program exercise sessions.    Initial Home Exercises Provided  01/05/18       Nutrition:  Target Goals: Understanding of nutrition guidelines, daily intake of sodium '1500mg'$ , cholesterol '200mg'$ , calories 30% from fat and 7% or less from saturated fats, daily to have 5 or more servings of fruits and vegetables.  Biometrics: Pre Biometrics - 12/18/17 1616      Pre Biometrics   Height  '5\' 4"'$  (1.626 m)    Weight  90.4 kg    Waist Circumference  42.5 inches    Hip Circumference  49 inches    Waist to Hip Ratio  0.87 %    BMI (Calculated)  34.21    Triceps Skinfold  23 mm    % Body Fat  43.9 %    Grip Strength  5.71 kg    Flexibility  12.8 in    Single Leg Stand  4 seconds      Post Biometrics - 03/02/18 1622       Post  Biometrics   Height  '5\' 4"'$  (1.626 m)    Weight  89.5 kg    Waist Circumference  41 inches    Hip Circumference  49 inches    Waist to Hip Ratio  0.84 %    BMI (Calculated)  33.85    Triceps Skinfold  21 mm    % Body Fat  42.6 %    Grip Strength  19 kg    Flexibility  13.6 in    Single Leg Stand  5 seconds       Nutrition Therapy Plan and Nutrition Goals: Nutrition Therapy & Goals - 03/04/18 1423      Nutrition Therapy   RD appointment deferred  Yes      Personal Nutrition Goals   Comments  Patient not able to make RD appointment due to work schedule. She continues to say she is eating heart healthy.       Nutrition Assessments: Nutrition Assessments - 12/18/17 1629      MEDFICTS Scores   Pre Score  37       Nutrition Goals Re-Evaluation:   Nutrition Goals Discharge (Final Nutrition Goals  Re-Evaluation):   Psychosocial: Target Goals: Acknowledge presence or absence of significant depression and/or stress, maximize coping skills, provide positive support system. Participant is able to verbalize types and ability to use techniques and skills needed for reducing stress and depression.  Initial Review & Psychosocial Screening: Initial Psych Review & Screening - 12/18/17 1625      Initial Review   Current issues with  None Identified  Family Dynamics   Good Support System?  Yes      Barriers   Psychosocial barriers to participate in program  There are no identifiable barriers or psychosocial needs.      Screening Interventions   Interventions  Encouraged to exercise    Expected Outcomes  Short Term goal: Identification and review with participant of any Quality of Life or Depression concerns found by scoring the questionnaire.;Long Term goal: The participant improves quality of Life and PHQ9 Scores as seen by post scores and/or verbalization of changes       Quality of Life Scores: Quality of Life - 03/02/18 1623      Quality of Life   Select  Quality of Life      Quality of Life Scores   Health/Function Pre  23.73 %    Health/Function Post  29.03 %    Health/Function % Change  22.33 %    Socioeconomic Pre  24.36 %    Socioeconomic Post  26.33 %    Socioeconomic % Change   8.09 %    Psych/Spiritual Pre  23.14 %    Psych/Spiritual Post  28.07 %    Psych/Spiritual % Change  21.31 %    Family Pre  25.5 %    Family Post  28.5 %    Family % Change  11.76 %    GLOBAL Pre  23.97 %    GLOBAL Post  28.26 %    GLOBAL % Change  17.9 %      Scores of 19 and below usually indicate a poorer quality of life in these areas.  A difference of  2-3 points is a clinically meaningful difference.  A difference of 2-3 points in the total score of the Quality of Life Index has been associated with significant improvement in overall quality of life, self-image, physical  symptoms, and general health in studies assessing change in quality of life.  PHQ-9: Recent Review Flowsheet Data    Depression screen [facility] 2/9 08/12/2016 05/30/2015 12/14/2014 12/14/2014 11/02/2013   Decreased Interest 0 0 0 0 0   Down, Depressed, Hopeless 0 0 0 0 0   PHQ - 2 Score 0 0 0 0 0   Altered sleeping - - 1 - -   Tired, decreased energy - - 1 - -   PHQ-9 Score - - 2 - -     Interpretation of Total Score  Total Score Depression Severity:  1-4 = Minimal depression, 5-9 = Mild depression, 10-14 = Moderate depression, 15-19 = Moderately severe depression, 20-27 = Severe depression   Psychosocial Evaluation and Intervention: Psychosocial Evaluation - 12/18/17 1627      Psychosocial Evaluation & Interventions   Interventions  Encouraged to exercise with the program and follow exercise prescription    Continue Psychosocial Services   No Follow up required       Psychosocial Re-Evaluation: Psychosocial Re-Evaluation    [place] Name 01/12/18 1418 02/04/18 1437 03/04/18 1427         Psychosocial Re-Evaluation   Current issues with  None Identified  None Identified  None Identified     Comments  Patient's initial QOL score was 23.97 and her PHQ-9 score was 2 with no psychosocial issues identified.   Patient's initial QOL score was 23.97 and her PHQ-9 score was 2 with no psychosocial issues identified.   Patient's initial QOL score was 23.97 and her PHQ-9 score was 2 with no psychosocial issues identified.  Expected Outcomes  Patient will have no psychosocial issues identified at discharge.   Patient will have no psychosocial issues identified at discharge.   Patient will have no psychosocial issues identified at discharge.      Interventions  Relaxation education;Encouraged to attend Cardiac Rehabilitation for the exercise;Stress management education  Relaxation education;Encouraged to attend Cardiac Rehabilitation for the exercise;Stress management education  Relaxation  education;Encouraged to attend Cardiac Rehabilitation for the exercise;Stress management education     Continue Psychosocial Services   No Follow up required  No Follow up required  No Follow up required        Psychosocial Discharge (Final Psychosocial Re-Evaluation): Psychosocial Re-Evaluation - 03/04/18 1427      Psychosocial Re-Evaluation   Current issues with  None Identified    Comments  Patient's initial QOL score was 23.97 and her PHQ-9 score was 2 with no psychosocial issues identified.     Expected Outcomes  Patient will have no psychosocial issues identified at discharge.     Interventions  Relaxation education;Encouraged to attend Cardiac Rehabilitation for the exercise;Stress management education    Continue Psychosocial Services   No Follow up required       Vocational Rehabilitation: Provide vocational rehab assistance to qualifying candidates.   Vocational Rehab Evaluation & Intervention: Vocational Rehab - 12/18/17 1631      Initial Vocational Rehab Evaluation & Intervention   Assessment shows need for Vocational Rehabilitation  No       Education: Education Goals: Education classes will be provided on a weekly basis, covering required topics. Participant will state understanding/return demonstration of topics presented.  Learning Barriers/Preferences: Learning Barriers/Preferences - 12/18/17 1630      Learning Barriers/Preferences   Learning Barriers  None    Learning Preferences  Pictoral;Computer/Internet;Video;Written Material       Education Topics: Hypertension, Hypertension Reduction -Define heart disease and high blood pressure. Discus how high blood pressure affects the body and ways to reduce high blood pressure.   Exercise and Your Heart -Discuss why it is important to exercise, the FITT principles of exercise, normal and abnormal responses to exercise, and how to exercise safely.   Angina -Discuss definition of angina, causes of angina,  treatment of angina, and how to decrease risk of having angina.   Cardiac Medications -Review what the following cardiac medications are used for, how they affect the body, and side effects that may occur when taking the medications.  Medications include Aspirin, Beta blockers, calcium channel blockers, ACE Inhibitors, angiotensin receptor blockers, diuretics, digoxin, and antihyperlipidemics.   CARDIAC REHAB PHASE II EXERCISE from 02/25/2018 in [place]  Date  12/24/17  Educator  DJ  Instruction Review Code  2- Demonstrated Understanding      Congestive Heart Failure -Discuss the definition of CHF, how to live with CHF, the signs and symptoms of CHF, and how keep track of weight and sodium intake.   CARDIAC REHAB PHASE II EXERCISE from 02/25/2018 in [place]  Date  12/31/17  Educator  D. Coad  Instruction Review Code  2- Demonstrated Understanding      Heart Disease and Intimacy -Discus the effect sexual activity has on the heart, how changes occur during intimacy as we age, and safety during sexual activity.   CARDIAC REHAB PHASE II EXERCISE from 02/25/2018 in [place]  Date  01/07/18  Educator  Etheleen Mayhew  Instruction Review Code  2- Demonstrated Understanding      Smoking Cessation / COPD -  Discuss different methods to quit smoking, the health benefits of quitting smoking, and the definition of COPD.   CARDIAC REHAB PHASE II EXERCISE from 02/25/2018 in [place]  Date  01/14/18  Educator  Etheleen Mayhew  Instruction Review Code  2- Demonstrated Understanding      Nutrition I: Fats -Discuss the types of cholesterol, what cholesterol does to the heart, and how cholesterol levels can be controlled.   CARDIAC REHAB PHASE II EXERCISE from 02/25/2018 in [place]  Date  01/21/18  Educator  Etheleen Mayhew  Instruction Review Code  2- Demonstrated Understanding       Nutrition II: Labels -Discuss the different components of food labels and how to read food label   CARDIAC REHAB PHASE II EXERCISE from 02/25/2018 in [place]  Date  01/28/18  Educator  Etheleen Mayhew  Instruction Review Code  2- Demonstrated Understanding      Heart Parts/Heart Disease and PAD -Discuss the anatomy of the heart, the pathway of blood circulation through the heart, and these are affected by heart disease.   CARDIAC REHAB PHASE II EXERCISE from 02/25/2018 in [place]  Date  02/04/18  Educator  D.Coad  Instruction Review Code  2- Demonstrated Understanding      Stress I: Signs and Symptoms -Discuss the causes of stress, how stress may lead to anxiety and depression, and ways to limit stress.   CARDIAC REHAB PHASE II EXERCISE from 02/25/2018 in [place]  Date  02/11/18  Educator  D. Coad  Instruction Review Code  2- Demonstrated Understanding      Stress II: Relaxation -Discuss different types of relaxation techniques to limit stress.   CARDIAC REHAB PHASE II EXERCISE from 02/25/2018 in [place]  Date  02/18/18  Educator  Waldo Laine  Instruction Review Code  2- Demonstrated Understanding      Warning Signs of Stroke / TIA -Discuss definition of a stroke, what the signs and symptoms are of a stroke, and how to identify when someone is having stroke.   CARDIAC REHAB PHASE II EXERCISE from 02/25/2018 in [place]  Date  02/25/18  Educator  Etheleen Mayhew  Instruction Review Code  2- Demonstrated Understanding      Knowledge Questionnaire Score: Knowledge Questionnaire Score - 12/18/17 1630      Knowledge Questionnaire Score   Pre Score  26/28       Core Components/Risk Factors/Patient Goals at Admission: Personal Goals and Risk Factors at Admission - 12/18/17 1631      Core Components/Risk Factors/Patient Goals on Admission     Weight Management  Yes    Intervention  Weight Management/Obesity: Establish reasonable short term and long term weight goals.    Admit Weight  199 lb 6.4 oz (90.4 kg)    Goal Weight: Short Term  194 lb 6.4 oz (88.2 kg)    Goal Weight: Long Term  189 lb 6.4 oz (85.9 kg)    Expected Outcomes  Short Term: Continue to assess and modify interventions until short term weight is achieved;Long Term: Adherence to nutrition and physical activity/exercise program aimed toward attainment of established weight goal    Personal Goal Other  Yes    Personal Goal  Patient would like to lose on short-term 3lbs month while in program and on a long term basis lose 40lbs overall.    Intervention  Attend CR 3 x week and supplement with 2  x week exercise at home.    Expected Outcomes  Achieve short-term and long-term goals.        Core Components/Risk Factors/Patient Goals Review:  Goals and Risk Factor Review    Row Name 01/12/18 1414 02/04/18 1432 03/04/18 1423         Core Components/Risk Factors/Patient Goals Review   Personal Goals Review  Weight Management/Obesity Lose weight 3 lbs/month; 40 in one year.   Weight Management/Obesity Lose weight 3 lbs/month and 40 in one year.   Weight Management/Obesity Lose weight 3 lbs/month and 40 in one year.      Review  Patient has completed 9 sessions gaining 3 lbs since her initial visit. She says she is trying to eat heart healthy. She continues to try and quit smoking on her own and is down to 3 cigarettes/day. She is doing well in the program with progression. She says she feels a little stronger and has returned to work. She is pleased with her progress so far and hopes to improve more as she continues.   Patient has completed 17 sessions gaining 3 lbs since her initial visit. She continues to say she is trying to eat heart healthy but is also trying to quit smoking which has been stressful causing her to eat more. She is currently smoking 3 to 5 cigarettes/day  depending on her stress level for the day. She continues to do well in the program with progression. She says she has more energy now and is able to do more at home when she gets off work. She feels like cleaning her house after working all day. Will continue to monitor for progress.   Patient has completed 29 session maintaining her weight since last 30 day review. She continues to do well in the program with progression. She continues to smoke 3 to 5 cigarettes/day depending on her stress level for the day. She continues to be very pleased with her progress in the program saying she is a lot stronger and has more energy to do things. She is able to do her housework after working all day. Will continue to monitor for progress.      Expected Outcomes  Patient will continue to attend sessions and complete the program and meet her personal goals.   Patient will continue to attend sessions and complete the program and meet her personal goals.   Patient will continue to attend sessions and complete the program and meet her personal goals.         Core Components/Risk Factors/Patient Goals at Discharge (Final Review):  Goals and Risk Factor Review - 03/04/18 1423      Core Components/Risk Factors/Patient Goals Review   Personal Goals Review  Weight Management/Obesity   Lose weight 3 lbs/month and 40 in one year.    Review  Patient has completed 29 session maintaining her weight since last 30 day review. She continues to do well in the program with progression. She continues to smoke 3 to 5 cigarettes/day depending on her stress level for the day. She continues to be very pleased with her progress in the program saying she is a lot stronger and has more energy to do things. She is able to do her housework after working all day. Will continue to monitor for progress.     Expected Outcomes  Patient will continue to attend sessions and complete the program and meet her personal goals.        ITP  Comments: ITP Comments  [place] Name 12/22/17 1405 02/02/18 1556         ITP Comments  Patient new to program. Plans to start Monday 12/22/17.  Patient continues to smoke on average 3 cigarettes/day but says she does smoke more if she has a stressful day. Will continue to monitor.          Comments: ITP REVIEW Patient is doing well in the program. Will continue to monitor for progress.

## 2018-03-06 ENCOUNTER — Encounter (HOSPITAL_COMMUNITY): Payer: BLUE CROSS/BLUE SHIELD

## 2018-03-09 ENCOUNTER — Encounter (HOSPITAL_COMMUNITY): Payer: BLUE CROSS/BLUE SHIELD

## 2018-03-11 ENCOUNTER — Encounter (HOSPITAL_COMMUNITY)
Admission: RE | Admit: 2018-03-11 | Discharge: 2018-03-11 | Disposition: A | Discharge status: Home / Self Care | Payer: BLUE CROSS/BLUE SHIELD | Source: Ambulatory Visit | Attending: Cardiology | Admitting: Cardiology

## 2018-03-11 DIAGNOSIS — I214 Non-ST elevation (NSTEMI) myocardial infarction: Secondary | ICD-10-CM

## 2018-03-11 DIAGNOSIS — Z7982 Long term (current) use of aspirin: Secondary | ICD-10-CM | POA: Diagnosis not present

## 2018-03-11 DIAGNOSIS — E782 Mixed hyperlipidemia: Secondary | ICD-10-CM | POA: Diagnosis not present

## 2018-03-11 DIAGNOSIS — I2510 Atherosclerotic heart disease of native coronary artery without angina pectoris: Secondary | ICD-10-CM | POA: Diagnosis not present

## 2018-03-11 DIAGNOSIS — Z955 Presence of coronary angioplasty implant and graft: Secondary | ICD-10-CM

## 2018-03-11 DIAGNOSIS — Z79899 Other long term (current) drug therapy: Secondary | ICD-10-CM | POA: Diagnosis not present

## 2018-03-11 DIAGNOSIS — I10 Essential (primary) hypertension: Secondary | ICD-10-CM | POA: Diagnosis not present

## 2018-03-11 DIAGNOSIS — F17210 Nicotine dependence, cigarettes, uncomplicated: Secondary | ICD-10-CM | POA: Diagnosis not present

## 2018-03-11 NOTE — Progress Notes (Signed)
Daily Session Note  Patient Details  Name: Stacy Douglas MRN: [id number] Date of Birth: Jan 08, 1951 Referring Provider:     [facility] from 12/18/2017 in [place]  Referring Provider  Domenic Polite      Encounter Date: 03/11/2018  Check In: Session Check In - 03/11/18 1542      Check-In   Supervising physician immediately available to respond to emergencies  See telemetry face sheet for immediately available MD    Location  [facility]    Staff Present  Russella Dar, MS, EP, [facility], Exercise Physiologist;Encarnacion Bole Wynetta Emery, RN, [facility] Munch, Exercise Physiologist    Medication changes reported      No    Fall or balance concerns reported     No    Tobacco Cessation  No Change    Warm-up and Cool-down  Performed as group-led instruction    Resistance Training Performed  Yes    VAD Patient?  No    PAD/SET Patient?  No      Pain Assessment   Currently in Pain?  No/denies    Pain Score  0-No pain    Multiple Pain Sites  No       Capillary Blood Glucose: No results found for this or any previous visit (from the past 24 hour(s)).    Social History   Tobacco Use  Smoking Status Current Some Day Smoker  . Packs/day: 0.01  . Years: 53.00  . Pack years: 0.53  . Types: Cigarettes  . Start date: 08/10/1964  Smokeless Tobacco Never Used  Tobacco Comment   She is weaning herself down.     Goals Met:  Independence with exercise equipment Exercise tolerated well No report of cardiac concerns or symptoms Strength training completed today  Goals Unmet:  Not Applicable  Comments: Pt able to follow exercise prescription today without complaint.  Will continue to monitor for progression. Check out 1645.   Dr. Kate Sable is Medical Director for [facility].

## 2018-03-13 ENCOUNTER — Encounter (HOSPITAL_COMMUNITY): Payer: BLUE CROSS/BLUE SHIELD

## 2018-03-13 NOTE — Progress Notes (Signed)
Discharge Progress Report  Patient Details  Name: Stacy Douglas MRN: [id number] Date of Birth: March 20, 1951 Referring Provider:     [facility] from 12/18/2017 in [place]  Referring Provider  Domenic Polite       Number of Visits: 30  Reason for Discharge:  Patient reached a stable level of exercise. Patient independent in their exercise. Patient has met program and personal goals.  Smoking History:  Social History   Tobacco Use  Smoking Status Current Some Day Smoker  . Packs/day: 0.01  . Years: 53.00  . Pack years: 0.53  . Types: Cigarettes  . Start date: 08/10/1964  Smokeless Tobacco Never Used  Tobacco Comment   She is weaning herself down.     Diagnosis:  NSTEMI (non-ST elevated myocardial infarction) ([place])  Status post coronary artery stent placement  ADL [facility]:   Initial Exercise Prescription: Initial Exercise Prescription - 12/18/17 1600      Date of Initial Exercise RX and Referring Provider   Date  12/18/17    Referring Provider  Domenic Polite    Expected Discharge Date  03/20/18      Treadmill   MPH  1.7    Grade  0    Minutes  17    METs  2.3      Recumbant Elliptical   Level  1    RPM  43    Watts  46    Minutes  22    METs  2.7      Prescription Details   Frequency (times per week)  3    Duration  Progress to 30 minutes of continuous aerobic without signs/symptoms of physical distress      Intensity   THRR 40-80% of Max Heartrate  111-125-140    Ratings of Perceived Exertion  11-13    Perceived Dyspnea  0-4      Progression   Progression  Continue progressive overload as per policy without signs/symptoms or physical distress.      Resistance Training   Training Prescription  Yes    Weight  1    Reps  10-15       Discharge Exercise Prescription (Final Exercise Prescription Changes): Exercise Prescription Changes - 02/17/18 0800      Response to Exercise   Blood Pressure (Admit)  118/72     Blood Pressure (Exercise)  140/64    Blood Pressure (Exit)  114/62    Heart Rate (Admit)  81 bpm    Heart Rate (Exercise)  114 bpm    Heart Rate (Exit)  89 bpm    Rating of Perceived Exertion (Exercise)  12    Comments  increased overall MET level     Duration  Progress to 30 minutes of  aerobic without signs/symptoms of physical distress    Intensity  THRR unchanged      Progression   Progression  Continue to progress workloads to maintain intensity without signs/symptoms of physical distress.    Average METs  4.19      Resistance Training   Training Prescription  Yes    Weight  3    Reps  10-15      Treadmill   MPH  2.6    Grade  0    Minutes  17    METs  2.98      Recumbant Elliptical   Level  3    RPM  59    Watts  92  Minutes  22    METs  5.4      Home Exercise Plan   Plans to continue exercise at  Home (comment)    Frequency  Add 2 additional days to program exercise sessions.    Initial Home Exercises Provided  01/05/18       Functional Capacity: 6 Minute Walk    Row Name 12/18/17 1606 03/02/18 1620       6 Minute Walk   Phase  Initial  Discharge    Distance  1450 feet  1650 feet    Distance % Change  -  13.79 %    Distance Feet Change  -  200 ft    Walk Time  6 minutes  6 minutes    # of Rest Breaks  0  0    MPH  2.75  3.13    METS  3.1  3.4    RPE  13  11    Perceived Dyspnea   9  6    VO2 Peak  10.67  11.88    Symptoms  Yes (comment)  No    Comments  Had a little discomfort in (R) upper chest. Went away after 82mnute rest.  -    Resting HR  81 bpm  80 bpm    Resting BP  114/60  130/70    Resting Oxygen Saturation   98 %  95 %    Exercise Oxygen Saturation  during 6 min walk  90 %  92 %    Max Ex. HR  96 bpm  97 bpm    Max Ex. BP  140/80  136/76    2 Minute Post BP  118/70  126/72       Psychological, QOL, Others - Outcomes: PHQ 2/9: [facility] 03/13/2018 08/12/2016 05/30/2015 12/14/2014 12/14/2014  Decreased Interest 0 0  0 0 0  Down, Depressed, Hopeless 0 0 0 0 0  PHQ - 2 Score 0 0 0 0 0  Altered sleeping 1 - - 1 -  Tired, decreased energy 0 - - 1 -  Change in appetite 0 - - - -  Feeling bad or failure about yourself  0 - - - -  Trouble concentrating 0 - - - -  Moving slowly or fidgety/restless 0 - - - -  Suicidal thoughts 0 - - - -  PHQ-9 Score 1 - - 2 -  Difficult doing work/chores Not difficult at all - - - -    Quality of Life: Quality of Life - 03/02/18 1623      Quality of Life   Select  Quality of Life      Quality of Life Scores   Health/Function Pre  23.73 %    Health/Function Post  29.03 %    Health/Function % Change  22.33 %    Socioeconomic Pre  24.36 %    Socioeconomic Post  26.33 %    Socioeconomic % Change   8.09 %    Psych/Spiritual Pre  23.14 %    Psych/Spiritual Post  28.07 %    Psych/Spiritual % Change  21.31 %    Family Pre  25.5 %    Family Post  28.5 %    Family % Change  11.76 %    GLOBAL Pre  23.97 %    GLOBAL Post  28.26 %    GLOBAL % Change  17.9 %       Personal Goals: Goals established  at orientation with interventions provided to work toward goal. Personal Goals and Risk Factors at Admission - 12/18/17 1631      Core Components/Risk Factors/Patient Goals on Admission    Weight Management  Yes    Intervention  Weight Management/Obesity: Establish reasonable short term and long term weight goals.    Admit Weight  199 lb 6.4 oz (90.4 kg)    Goal Weight: Short Term  194 lb 6.4 oz (88.2 kg)    Goal Weight: Long Term  189 lb 6.4 oz (85.9 kg)    Expected Outcomes  Short Term: Continue to assess and modify interventions until short term weight is achieved;Long Term: Adherence to nutrition and physical activity/exercise program aimed toward attainment of established weight goal    Personal Goal Other  Yes    Personal Goal  Patient would like to lose on short-term 3lbs month while in program and on a long term basis lose 40lbs overall.    Intervention  Attend CR  3 x week and supplement with 2 x week exercise at home.    Expected Outcomes  Achieve short-term and long-term goals.         Personal Goals Discharge: Goals and Risk Factor Review    Row Name 01/12/18 1414 02/04/18 1432 03/04/18 1423 03/13/18 1444       Core Components/Risk Factors/Patient Goals Review   Personal Goals Review  Weight Management/Obesity Lose weight 3 lbs/month; 40 in one year.   Weight Management/Obesity Lose weight 3 lbs/month and 40 in one year.   Weight Management/Obesity Lose weight 3 lbs/month and 40 in one year.   Weight Management/Obesity Lose weight 3 lbs/week and 40 overall.     Review  Patient has completed 9 sessions gaining 3 lbs since her initial visit. She says she is trying to eat heart healthy. She continues to try and quit smoking on her own and is down to 3 cigarettes/day. She is doing well in the program with progression. She says she feels a little stronger and has returned to work. She is pleased with her progress so far and hopes to improve more as she continues.   Patient has completed 17 sessions gaining 3 lbs since her initial visit. She continues to say she is trying to eat heart healthy but is also trying to quit smoking which has been stressful causing her to eat more. She is currently smoking 3 to 5 cigarettes/day depending on her stress level for the day. She continues to do well in the program with progression. She says she has more energy now and is able to do more at home when she gets off work. She feels like cleaning her house after working all day. Will continue to monitor for progress.   Patient has completed 29 session maintaining her weight since last 30 day review. She continues to do well in the program with progression. She continues to smoke 3 to 5 cigarettes/day depending on her stress level for the day. She continues to be very pleased with her progress in the program saying she is a lot stronger and has more energy to do things. She is able  to do her housework after working all day. Will continue to monitor for progress.   Patient graduated with 30 sessions maintaining her weight throughout the program. She is eating healthier. Her medficts score improved by 19. Her exit measurements improved in grip strength and flexibility. Her exit walk test improved by 13.79%. She did well in the program with  increase strength and stamina. She says she continues to try and quit smoking and believes when she retires, she will be able to quit with less stress. She is very pleased with her progress in the program and is walking more now for exercise. She plans to join [employer] to continue exercising. CR will f/u for one year.     Expected Outcomes  Patient will continue to attend sessions and complete the program and meet her personal goals.   Patient will continue to attend sessions and complete the program and meet her personal goals.   Patient will continue to attend sessions and complete the program and meet her personal goals.   Patient will continue to exercise at [employer] and continue to meet her personal goals.        Exercise Goals and Review: Exercise Goals    Row Name 12/18/17 1614             Exercise Goals   Able to understand and use rate of perceived exertion (RPE) scale  Yes       Intervention  Provide education and explanation on how to use RPE scale       Expected Outcomes  Short Term: Able to use RPE daily in rehab to express subjective intensity level;Long Term:  Able to use RPE to guide intensity level when exercising independently       Knowledge and understanding of Target Heart Rate Range (THRR)  Yes       Intervention  Provide education and explanation of THRR including how the numbers were predicted and where they are located for reference       Expected Outcomes  Short Term: Able to use daily as guideline for intensity in rehab;Long Term: Able to use THRR to govern intensity when exercising independently        Able to check pulse independently  Yes       Intervention  Provide education and demonstration on how to check pulse in carotid and radial arteries.       Expected Outcomes  Short Term: Able to explain why pulse checking is important during independent exercise;Long Term: Able to check pulse independently and accurately       Understanding of Exercise Prescription  Yes       Intervention  Provide education, explanation, and written materials on patient's individual exercise prescription       Expected Outcomes  Short Term: Able to explain program exercise prescription;Long Term: Able to explain home exercise prescription to exercise independently          Exercise Goals Re-Evaluation: Exercise Goals Re-Evaluation    Row Name 01/01/18 1539 01/12/18 1323 02/02/18 1444 03/02/18 1451       Exercise Goal Re-Evaluation   Exercise Goals Review  Increase Physical Activity;Understanding of Exercise Prescription  Increase Physical Activity;Increase Strength and Stamina;Understanding of Exercise Prescription  Increase Physical Activity;Increase Strength and Stamina;Understanding of Exercise Prescription  Increase Physical Activity;Increase Strength and Stamina;Understanding of Exercise Prescription    Comments  Patient's most prioritized goal is to lose weight, she'd like to lose 40 pounds in a year. She has been working hard in rehab to increase her activity level which will aid in her weight loss.   Patient's most prioritized goal is to lose weight, she'd like to lose 40 pounds in a year. She has been working hard in rehab to increase her activity level which will aid in her weight loss.   Patient has done well in  the program. She has increased her workloads on the equipment and weights, while increasing her overall MET level. She is still trying to lose weight but has seemed to hit a plateu but says she feels better since exercising.   Patient continues to do well and work hard in the program. She reports  feeling stronger and being able to get out and do more with her grandchildren. She is losing weight, slowly but surely. She works hard and is always open to any increases in workloads.     Expected Outcomes  Patient will lose weight.   Patient ultimate goal is to lose weight.   Increase strength and stamina. Lose weight.   Lose weight and increase strength.        Nutrition & Weight - Outcomes: Pre Biometrics - 12/18/17 1616      Pre Biometrics   Height  '5\' 4"'  (1.626 m)    Weight  90.4 kg    Waist Circumference  42.5 inches    Hip Circumference  49 inches    Waist to Hip Ratio  0.87 %    BMI (Calculated)  34.21    Triceps Skinfold  23 mm    % Body Fat  43.9 %    Grip Strength  5.71 kg    Flexibility  12.8 in    Single Leg Stand  4 seconds      Post Biometrics - 03/02/18 1622       Post  Biometrics   Height  '5\' 4"'  (1.626 m)    Weight  89.5 kg    Waist Circumference  41 inches    Hip Circumference  49 inches    Waist to Hip Ratio  0.84 %    BMI (Calculated)  33.85    Triceps Skinfold  21 mm    % Body Fat  42.6 %    Grip Strength  19 kg    Flexibility  13.6 in    Single Leg Stand  5 seconds       Nutrition: Nutrition Therapy & Goals - 03/04/18 1423      Nutrition Therapy   RD appointment deferred  Yes      Personal Nutrition Goals   Comments  Patient not able to make RD appointment due to work schedule. She continues to say she is eating heart healthy.       Nutrition Discharge: Nutrition Assessments - 03/13/18 1442      MEDFICTS Scores   Pre Score  37    Post Score  18    Score Difference  -19       Education Questionnaire Score: Knowledge Questionnaire Score - 03/13/18 1442      Knowledge Questionnaire Score   Pre Score  26/28    Post Score  25/28       Goals reviewed with patient; copy given to patient.

## 2018-03-13 NOTE — Progress Notes (Signed)
Cardiac Individual Treatment Plan  Patient Details  Name: JI FAIRBURN MRN: [id number] Date of Birth: 1950-09-24 Referring Provider:     [place] from 12/18/2017 in [place]  Referring Provider  Domenic Polite      Initial Encounter Date:    CARDIAC REHAB PHASE II ORIENTATION from 12/18/2017 in [place]  Date  12/18/17      Visit Diagnosis: NSTEMI (non-ST elevated myocardial infarction) [facility])  Status post coronary artery stent placement  Patient's Home Medications on Admission:  Current Outpatient Medications:  .  aspirin EC 81 MG tablet, Take 81 mg by mouth daily., Disp: , Rfl:  .  metoprolol succinate (TOPROL XL) 25 MG 24 hr tablet, Take 0.5 tablets (12.5 mg total) by mouth daily., Disp: 45 tablet, Rfl: 3 .  nitroGLYCERIN (NITROSTAT) 0.4 MG SL tablet, Place 1 tablet (0.4 mg total) under the tongue every 5 (five) minutes x 3 doses as needed., Disp: 25 tablet, Rfl: 3 .  prasugrel (EFFIENT) 10 MG TABS tablet, TAKE 1 TABLET BY MOUTH ONCE DAILY, Disp: 90 tablet, Rfl: 1  Past Medical History: Past Medical History:  Diagnosis Date  . Coronary atherosclerosis of native coronary artery    a. VF arrest in [place] s/p BMS LAD 5/11, PTCA circumflex 6/11, LVEF 40%. b. [place] 10/2014 s/p DES to prox and mid RCA, otherwise patent LAD stent and moderate disease in the Cx, LVEF 55-65%. c. low-risk NST in 06/2017 d. 09/2017: NSTEMI with cath showing 99% mid-LCx stenosis --> treated with DES; patent stents along LAD and RCA.   Marland Kitchen DVT (deep venous thrombosis) (HCC) 1992   LLE  . Essential hypertension   . History of duodenal ulcer 1960s  . Mixed hyperlipidemia   . Myocardial infarction ([place]) 89/3810   complicated by VF arrest  . NSTEMI (non-ST elevated myocardial infarction) ([place]) 10/07/2017   Archie Endo 10/07/2017  . Obesity   . Plavix resistance   . Tobacco abuse     Tobacco Use: Social History   Tobacco Use  Smoking  Status Current Some Day Smoker  . Packs/day: 0.01  . Years: 53.00  . Pack years: 0.53  . Types: Cigarettes  . Start date: 08/10/1964  Smokeless Tobacco Never Used  Tobacco Comment   She is weaning herself down.     Labs: Recent Review Flowsheet Data    Labs for ITP Cardiac and Pulmonary Rehab Latest Ref Rng & Units 11/16/2014 03/04/2015 07/31/2016 10/08/2017 12/18/2017   Cholestrol <200 mg/dL 152 149 158 154 149   LDLCALC mg/dL (calc) 95 95 99 94 85   HDL >50 mg/dL 34(L) 41(L) 39(L) 37(L) 45(L)   Trlycerides <150 mg/dL 113 64 100 117 91   Hemoglobin A1c 4.8 - 5.6 % - - - - -      Capillary Blood Glucose: No results found for: GLUCAP   Exercise Target Goals: Exercise Program Goal: Individual exercise prescription set using results from initial 6 min walk test and THRR while considering  patient's activity barriers and safety.   Exercise Prescription Goal: Starting with aerobic activity 30 plus minutes a day, 3 days per week for initial exercise prescription. Provide home exercise prescription and guidelines that participant acknowledges understanding prior to discharge.  Activity Barriers & Risk Stratification: Activity Barriers & Cardiac Risk Stratification - 12/18/17 1609      Activity Barriers & Cardiac Risk Stratification   Activity Barriers  None    Cardiac Risk Stratification  High  6 Minute Walk: 6 Minute Walk    Row Name 12/18/17 1606 03/02/18 1620       6 Minute Walk   Phase  Initial  Discharge    Distance  1450 feet  1650 feet    Distance % Change  -  13.79 %    Distance Feet Change  -  200 ft    Walk Time  6 minutes  6 minutes    # of Rest Breaks  0  0    MPH  2.75  3.13    METS  3.1  3.4    RPE  13  11    Perceived Dyspnea   9  6    VO2 Peak  10.67  11.88    Symptoms  Yes (comment)  No    Comments  Had a little discomfort in (R) upper chest. Went away after 35mnute rest.  -    Resting HR  81 bpm  80 bpm    Resting BP  114/60  130/70    Resting  Oxygen Saturation   98 %  95 %    Exercise Oxygen Saturation  during 6 min walk  90 %  92 %    Max Ex. HR  96 bpm  97 bpm    Max Ex. BP  140/80  136/76    2 Minute Post BP  118/70  126/72       Oxygen Initial Assessment:   Oxygen Re-Evaluation:   Oxygen Discharge (Final Oxygen Re-Evaluation):   Initial Exercise Prescription: Initial Exercise Prescription - 12/18/17 1600      Date of Initial Exercise RX and Referring Provider   Date  12/18/17    Referring Provider  MDomenic Polite   Expected Discharge Date  03/20/18      Treadmill   MPH  1.7    Grade  0    Minutes  17    METs  2.3      Recumbant Elliptical   Level  1    RPM  43    Watts  46    Minutes  22    METs  2.7      Prescription Details   Frequency (times per week)  3    Duration  Progress to 30 minutes of continuous aerobic without signs/symptoms of physical distress      Intensity   THRR 40-80% of Max Heartrate  111-125-140    Ratings of Perceived Exertion  11-13    Perceived Dyspnea  0-4      Progression   Progression  Continue progressive overload as per policy without signs/symptoms or physical distress.      Resistance Training   Training Prescription  Yes    Weight  1    Reps  10-15       Perform Capillary Blood Glucose checks as needed.  Exercise Prescription Changes:  Exercise Prescription Changes    Row Name 01/01/18 1500 01/27/18 0800 02/17/18 0800         Response to Exercise   Blood Pressure (Admit)  122/70  126/60  118/72     Blood Pressure (Exercise)  142/68  130/70  140/64     Blood Pressure (Exit)  110/60  110/60  114/62     Heart Rate (Admit)  82 bpm  74 bpm  81 bpm     Heart Rate (Exercise)  109 bpm  108 bpm  114 bpm     Heart Rate (Exit)  91  bpm  79 bpm  89 bpm     Rating of Perceived Exertion (Exercise)  _0 Comments  -  -  increased overall MET level      Duration  Progress to 30 minutes of  aerobic without signs/symptoms of physical distress  Progress to 30  minutes of  aerobic without signs/symptoms of physical distress  Progress to 30 minutes of  aerobic without signs/symptoms of physical distress     Intensity  THRR New 111-125-140  THRR unchanged  THRR unchanged       Progression   Progression  Continue to progress workloads to maintain intensity without signs/symptoms of physical distress.  Continue to progress workloads to maintain intensity without signs/symptoms of physical distress.  Continue to progress workloads to maintain intensity without signs/symptoms of physical distress.     Average METs  3.08  3.44  4.19       Resistance Training   Training Prescription  Yes  Yes  Yes     Weight  _1 Reps  10-15  10-15  10-15       Treadmill   MPH  1.8  2.2  2.6     Grade  0  0  0     Minutes  _2 METs  2.37  2.68  2.98       Recumbant Elliptical   Level  _3 RPM  60  56  59     Watts  76  77  92     Minutes  _4 METs  2.7  4.2  5.4       Home Exercise Plan   Plans to continue exercise at  -  Home (comment) treadmill, hand weights   Home (comment)     Frequency  -  Add 2 additional days to program exercise sessions.  Add 2 additional days to program exercise sessions.     Initial Home Exercises Provided  -  01/05/18  01/05/18        Exercise Comments:  Exercise Comments    Row Name 01/01/18 1543 01/12/18 1325 02/02/18 1448 03/02/18 1455     Exercise Comments  Patient has been doing well so far in rehab. She has only had 5 visits to date. She has already shown to increase her MET level on the equipment as tolerated with progression.   Patient is doing well in the program thus far. She has tolerated the equipment and weight increases.   Continues to progress as tolerated. Increased overall MET level while increasing workloads.   Works hard to increase overall strength. She has increased her overall MET level and tolerated all progressions with ease.        Exercise Goals and  Review:  Exercise Goals    Row Name 12/18/17 1614             Exercise Goals   Able to understand and use rate of perceived exertion (RPE) scale  Yes       Intervention  Provide education and explanation on how to use RPE scale       Expected Outcomes  Short Term: Able to use RPE daily in rehab to express subjective intensity level;Long Term:  Able to use RPE to guide intensity level when exercising independently  Knowledge and understanding of Target Heart Rate Range (THRR)  Yes       Intervention  Provide education and explanation of THRR including how the numbers were predicted and where they are located for reference       Expected Outcomes  Short Term: Able to use daily as guideline for intensity in rehab;Long Term: Able to use THRR to govern intensity when exercising independently       Able to check pulse independently  Yes       Intervention  Provide education and demonstration on how to check pulse in carotid and radial arteries.       Expected Outcomes  Short Term: Able to explain why pulse checking is important during independent exercise;Long Term: Able to check pulse independently and accurately       Understanding of Exercise Prescription  Yes       Intervention  Provide education, explanation, and written materials on patient's individual exercise prescription       Expected Outcomes  Short Term: Able to explain program exercise prescription;Long Term: Able to explain home exercise prescription to exercise independently          Exercise Goals Re-Evaluation : Exercise Goals Re-Evaluation    Row Name 01/01/18 1539 01/12/18 1323 02/02/18 1444 03/02/18 1451       Exercise Goal Re-Evaluation   Exercise Goals Review  Increase Physical Activity;Understanding of Exercise Prescription  Increase Physical Activity;Increase Strength and Stamina;Understanding of Exercise Prescription  Increase Physical Activity;Increase Strength and Stamina;Understanding of Exercise  Prescription  Increase Physical Activity;Increase Strength and Stamina;Understanding of Exercise Prescription    Comments  Patient's most prioritized goal is to lose weight, she'd like to lose 40 pounds in a year. She has been working hard in rehab to increase her activity level which will aid in her weight loss.   Patient's most prioritized goal is to lose weight, she'd like to lose 40 pounds in a year. She has been working hard in rehab to increase her activity level which will aid in her weight loss.   Patient has done well in the program. She has increased her workloads on the equipment and weights, while increasing her overall MET level. She is still trying to lose weight but has seemed to hit a plateu but says she feels better since exercising.   Patient continues to do well and work hard in the program. She reports feeling stronger and being able to get out and do more with her grandchildren. She is losing weight, slowly but surely. She works hard and is always open to any increases in workloads.     Expected Outcomes  Patient will lose weight.   Patient ultimate goal is to lose weight.   Increase strength and stamina. Lose weight.   Lose weight and increase strength.         Discharge Exercise Prescription (Final Exercise Prescription Changes): Exercise Prescription Changes - 02/17/18 0800      Response to Exercise   Blood Pressure (Admit)  118/72    Blood Pressure (Exercise)  140/64    Blood Pressure (Exit)  114/62    Heart Rate (Admit)  81 bpm    Heart Rate (Exercise)  114 bpm    Heart Rate (Exit)  89 bpm    Rating of Perceived Exertion (Exercise)  12    Comments  increased overall MET level     Duration  Progress to 30 minutes of  aerobic without signs/symptoms of physical distress  Intensity  THRR unchanged      Progression   Progression  Continue to progress workloads to maintain intensity without signs/symptoms of physical distress.    Average METs  4.19      Resistance  Training   Training Prescription  Yes    Weight  3    Reps  10-15      Treadmill   MPH  2.6    Grade  0    Minutes  17    METs  2.98      Recumbant Elliptical   Level  3    RPM  59    Watts  92    Minutes  22    METs  5.4      Home Exercise Plan   Plans to continue exercise at  Home (comment)    Frequency  Add 2 additional days to program exercise sessions.    Initial Home Exercises Provided  01/05/18       Nutrition:  Target Goals: Understanding of nutrition guidelines, daily intake of sodium <1516m, cholesterol <2041m calories 30% from fat and 7% or less from saturated fats, daily to have 5 or more servings of fruits and vegetables.  Biometrics: Pre Biometrics - 12/18/17 1616      Pre Biometrics   Height  _0  (1.626 m)    Weight  90.4 kg    Waist Circumference  42.5 inches    Hip Circumference  49 inches    Waist to Hip Ratio  0.87 %    BMI (Calculated)  34.21    Triceps Skinfold  23 mm    % Body Fat  43.9 %    Grip Strength  5.71 kg    Flexibility  12.8 in    Single Leg Stand  4 seconds      Post Biometrics - 03/02/18 1622       Post  Biometrics   Height  _1  (1.626 m)    Weight  89.5 kg    Waist Circumference  41 inches    Hip Circumference  49 inches    Waist to Hip Ratio  0.84 %    BMI (Calculated)  33.85    Triceps Skinfold  21 mm    % Body Fat  42.6 %    Grip Strength  19 kg    Flexibility  13.6 in    Single Leg Stand  5 seconds       Nutrition Therapy Plan and Nutrition Goals: Nutrition Therapy & Goals - 03/04/18 1423      Nutrition Therapy   RD appointment deferred  Yes      Personal Nutrition Goals   Comments  Patient not able to make RD appointment due to work schedule. She continues to say she is eating heart healthy.       Nutrition Assessments: Nutrition Assessments - 03/13/18 1442      MEDFICTS Scores   Pre Score  37    Post Score  18    Score Difference  -19       Nutrition Goals Re-Evaluation:   Nutrition  Goals Discharge (Final Nutrition Goals Re-Evaluation):   Psychosocial: Target Goals: Acknowledge presence or absence of significant depression and/or stress, maximize coping skills, provide positive support system. Participant is able to verbalize types and ability to use techniques and skills needed for reducing stress and depression.  Initial Review & Psychosocial Screening: Initial Psych Review & Screening - 12/18/17 1625  Initial Review   Current issues with  None Identified      Family Dynamics   Good Support System?  Yes      Barriers   Psychosocial barriers to participate in program  There are no identifiable barriers or psychosocial needs.      Screening Interventions   Interventions  Encouraged to exercise    Expected Outcomes  Short Term goal: Identification and review with participant of any Quality of Life or Depression concerns found by scoring the questionnaire.;Long Term goal: The participant improves quality of Life and PHQ9 Scores as seen by post scores and/or verbalization of changes       Quality of Life Scores: Quality of Life - 03/02/18 1623      Quality of Life   Select  Quality of Life      Quality of Life Scores   Health/Function Pre  23.73 %    Health/Function Post  29.03 %    Health/Function % Change  22.33 %    Socioeconomic Pre  24.36 %    Socioeconomic Post  26.33 %    Socioeconomic % Change   8.09 %    Psych/Spiritual Pre  23.14 %    Psych/Spiritual Post  28.07 %    Psych/Spiritual % Change  21.31 %    Family Pre  25.5 %    Family Post  28.5 %    Family % Change  11.76 %    GLOBAL Pre  23.97 %    GLOBAL Post  28.26 %    GLOBAL % Change  17.9 %      Scores of 19 and below usually indicate a poorer quality of life in these areas.  A difference of  2-3 points is a clinically meaningful difference.  A difference of 2-3 points in the total score of the Quality of Life Index has been associated with significant improvement in overall quality  of life, self-image, physical symptoms, and general health in studies assessing change in quality of life.  PHQ-9: Recent Review Flowsheet Data    Depression screen [facility] 2/9 03/13/2018 08/12/2016 05/30/2015 12/14/2014 12/14/2014   Decreased Interest 0 0 0 0 0   Down, Depressed, Hopeless 0 0 0 0 0   PHQ - 2 Score 0 0 0 0 0   Altered sleeping 1 - - 1 -   Tired, decreased energy 0 - - 1 -   Change in appetite 0 - - - -   Feeling bad or failure about yourself  0 - - - -   Trouble concentrating 0 - - - -   Moving slowly or fidgety/restless 0 - - - -   Suicidal thoughts 0 - - - -   PHQ-9 Score 1 - - 2 -   Difficult doing work/chores Not difficult at all - - - -     Interpretation of Total Score  Total Score Depression Severity:  1-4 = Minimal depression, 5-9 = Mild depression, 10-14 = Moderate depression, 15-19 = Moderately severe depression, 20-27 = Severe depression   Psychosocial Evaluation and Intervention: Psychosocial Evaluation - 03/13/18 1447      Discharge Psychosocial Assessment & Intervention   Comments  Patient has no psychosocial issues identified at [place]. Her discharge QOL score improved by 17.90% and her PHQ-9 score improved by 1.        Psychosocial Re-Evaluation: Psychosocial Re-Evaluation    [place] Name 01/12/18 1418 02/04/18 1437 03/04/18 1427  Psychosocial Re-Evaluation   Current issues with  None Identified  None Identified  None Identified     Comments  Patient's initial QOL score was 23.97 and her PHQ-9 score was 2 with no psychosocial issues identified.   Patient's initial QOL score was 23.97 and her PHQ-9 score was 2 with no psychosocial issues identified.   Patient's initial QOL score was 23.97 and her PHQ-9 score was 2 with no psychosocial issues identified.      Expected Outcomes  Patient will have no psychosocial issues identified at discharge.   Patient will have no psychosocial issues identified at discharge.   Patient will have no psychosocial  issues identified at discharge.      Interventions  Relaxation education;Encouraged to attend Cardiac Rehabilitation for the exercise;Stress management education  Relaxation education;Encouraged to attend Cardiac Rehabilitation for the exercise;Stress management education  Relaxation education;Encouraged to attend Cardiac Rehabilitation for the exercise;Stress management education     Continue Psychosocial Services   No Follow up required  No Follow up required  No Follow up required        Psychosocial Discharge (Final Psychosocial Re-Evaluation): Psychosocial Re-Evaluation - 03/04/18 1427      Psychosocial Re-Evaluation   Current issues with  None Identified    Comments  Patient's initial QOL score was 23.97 and her PHQ-9 score was 2 with no psychosocial issues identified.     Expected Outcomes  Patient will have no psychosocial issues identified at discharge.     Interventions  Relaxation education;Encouraged to attend Cardiac Rehabilitation for the exercise;Stress management education    Continue Psychosocial Services   No Follow up required       Vocational Rehabilitation: Provide vocational rehab assistance to qualifying candidates.   Vocational Rehab Evaluation & Intervention: Vocational Rehab - 12/18/17 1631      Initial Vocational Rehab Evaluation & Intervention   Assessment shows need for Vocational Rehabilitation  No       Education: Education Goals: Education classes will be provided on a weekly basis, covering required topics. Participant will state understanding/return demonstration of topics presented.  Learning Barriers/Preferences: Learning Barriers/Preferences - 12/18/17 1630      Learning Barriers/Preferences   Learning Barriers  None    Learning Preferences  Pictoral;Computer/Internet;Video;Written Material       Education Topics: Hypertension, Hypertension Reduction -Define heart disease and high blood pressure. Discus how high blood pressure affects  the body and ways to reduce high blood pressure.   Exercise and Your Heart -Discuss why it is important to exercise, the FITT principles of exercise, normal and abnormal responses to exercise, and how to exercise safely.   CARDIAC REHAB PHASE II EXERCISE from 03/11/2018 in [place]  Date  03/11/18  Educator  Coad  Instruction Review Code  2- Demonstrated Understanding      Angina -Discuss definition of angina, causes of angina, treatment of angina, and how to decrease risk of having angina.   Cardiac Medications -Review what the following cardiac medications are used for, how they affect the body, and side effects that may occur when taking the medications.  Medications include Aspirin, Beta blockers, calcium channel blockers, ACE Inhibitors, angiotensin receptor blockers, diuretics, digoxin, and antihyperlipidemics.   CARDIAC REHAB PHASE II EXERCISE from 03/11/2018 in [place]  Date  12/24/17  Educator  DJ  Instruction Review Code  2- Demonstrated Understanding      Congestive Heart Failure -Discuss the definition of CHF, how to live with CHF, the signs  and symptoms of CHF, and how keep track of weight and sodium intake.   CARDIAC REHAB PHASE II EXERCISE from 03/11/2018 in [place]  Date  12/31/17  Educator  D. Coad  Instruction Review Code  2- Demonstrated Understanding      Heart Disease and Intimacy -Discus the effect sexual activity has on the heart, how changes occur during intimacy as we age, and safety during sexual activity.   CARDIAC REHAB PHASE II EXERCISE from 03/11/2018 in [place]  Date  01/07/18  Educator  Etheleen Mayhew  Instruction Review Code  2- Demonstrated Understanding      Smoking Cessation / COPD -Discuss different methods to quit smoking, the health benefits of quitting smoking, and the definition of COPD.   CARDIAC REHAB PHASE II EXERCISE from  03/11/2018 in [place]  Date  01/14/18  Educator  Etheleen Mayhew  Instruction Review Code  2- Demonstrated Understanding      Nutrition I: Fats -Discuss the types of cholesterol, what cholesterol does to the heart, and how cholesterol levels can be controlled.   CARDIAC REHAB PHASE II EXERCISE from 03/11/2018 in [place]  Date  01/21/18  Educator  Etheleen Mayhew  Instruction Review Code  2- Demonstrated Understanding      Nutrition II: Labels -Discuss the different components of food labels and how to read food label   CARDIAC REHAB PHASE II EXERCISE from 03/11/2018 in [place]  Date  01/28/18  Educator  Etheleen Mayhew  Instruction Review Code  2- Demonstrated Understanding      Heart Parts/Heart Disease and PAD -Discuss the anatomy of the heart, the pathway of blood circulation through the heart, and these are affected by heart disease.   CARDIAC REHAB PHASE II EXERCISE from 03/11/2018 in [place]  Date  02/04/18  Educator  D.Coad  Instruction Review Code  2- Demonstrated Understanding      Stress I: Signs and Symptoms -Discuss the causes of stress, how stress may lead to anxiety and depression, and ways to limit stress.   CARDIAC REHAB PHASE II EXERCISE from 03/11/2018 in [place]  Date  02/11/18  Educator  D. Coad  Instruction Review Code  2- Demonstrated Understanding      Stress II: Relaxation -Discuss different types of relaxation techniques to limit stress.   CARDIAC REHAB PHASE II EXERCISE from 03/11/2018 in [place]  Date  02/18/18  Educator  Waldo Laine  Instruction Review Code  2- Demonstrated Understanding      Warning Signs of Stroke / TIA -Discuss definition of a stroke, what the signs and symptoms are of a stroke, and how to identify when someone is having stroke.   CARDIAC REHAB PHASE II EXERCISE from 03/11/2018  in [place]  Date  02/25/18  Educator  Etheleen Mayhew  Instruction Review Code  2- Demonstrated Understanding      Knowledge Questionnaire Score: Knowledge Questionnaire Score - 03/13/18 1442      Knowledge Questionnaire Score   Pre Score  26/28    Post Score  25/28       Core Components/Risk Factors/Patient Goals at Admission: Personal Goals and Risk Factors at Admission - 12/18/17 1631      Core Components/Risk Factors/Patient Goals on Admission    Weight Management  Yes    Intervention  Weight Management/Obesity: Establish reasonable short term and long term weight goals.  Admit Weight  199 lb 6.4 oz (90.4 kg)    Goal Weight: Short Term  194 lb 6.4 oz (88.2 kg)    Goal Weight: Long Term  189 lb 6.4 oz (85.9 kg)    Expected Outcomes  Short Term: Continue to assess and modify interventions until short term weight is achieved;Long Term: Adherence to nutrition and physical activity/exercise program aimed toward attainment of established weight goal    Personal Goal Other  Yes    Personal Goal  Patient would like to lose on short-term 3lbs month while in program and on a long term basis lose 40lbs overall.    Intervention  Attend CR 3 x week and supplement with 2 x week exercise at home.    Expected Outcomes  Achieve short-term and long-term goals.        Core Components/Risk Factors/Patient Goals Review:  Goals and Risk Factor Review    Row Name 01/12/18 1414 02/04/18 1432 03/04/18 1423 03/13/18 1444       Core Components/Risk Factors/Patient Goals Review   Personal Goals Review  Weight Management/Obesity Lose weight 3 lbs/month; 40 in one year.   Weight Management/Obesity Lose weight 3 lbs/month and 40 in one year.   Weight Management/Obesity Lose weight 3 lbs/month and 40 in one year.   Weight Management/Obesity Lose weight 3 lbs/week and 40 overall.     Review  Patient has completed 9 sessions gaining 3 lbs since her initial visit. She says she is  trying to eat heart healthy. She continues to try and quit smoking on her own and is down to 3 cigarettes/day. She is doing well in the program with progression. She says she feels a little stronger and has returned to work. She is pleased with her progress so far and hopes to improve more as she continues.   Patient has completed 17 sessions gaining 3 lbs since her initial visit. She continues to say she is trying to eat heart healthy but is also trying to quit smoking which has been stressful causing her to eat more. She is currently smoking 3 to 5 cigarettes/day depending on her stress level for the day. She continues to do well in the program with progression. She says she has more energy now and is able to do more at home when she gets off work. She feels like cleaning her house after working all day. Will continue to monitor for progress.   Patient has completed 29 session maintaining her weight since last 30 day review. She continues to do well in the program with progression. She continues to smoke 3 to 5 cigarettes/day depending on her stress level for the day. She continues to be very pleased with her progress in the program saying she is a lot stronger and has more energy to do things. She is able to do her housework after working all day. Will continue to monitor for progress.   Patient graduated with 30 sessions maintaining her weight throughout the program. She is eating healthier. Her medficts score improved by 19. Her exit measurements improved in grip strength and flexibility. Her exit walk test improved by 13.79%. She did well in the program with increase strength and stamina. She says she continues to try and quit smoking and believes when she retires, she will be able to quit with less stress. She is very pleased with her progress in the program and is walking more now for exercise. She plans to join [employer] to continue exercising. CR will  f/u for one year.     Expected Outcomes  Patient  will continue to attend sessions and complete the program and meet her personal goals.   Patient will continue to attend sessions and complete the program and meet her personal goals.   Patient will continue to attend sessions and complete the program and meet her personal goals.   Patient will continue to exercise at [employer] and continue to meet her personal goals.        Core Components/Risk Factors/Patient Goals at Discharge (Final Review):  Goals and Risk Factor Review - 03/13/18 1444      Core Components/Risk Factors/Patient Goals Review   Personal Goals Review  Weight Management/Obesity   Lose weight 3 lbs/week and 40 overall.    Review  Patient graduated with 30 sessions maintaining her weight throughout the program. She is eating healthier. Her medficts score improved by 19. Her exit measurements improved in grip strength and flexibility. Her exit walk test improved by 13.79%. She did well in the program with increase strength and stamina. She says she continues to try and quit smoking and believes when she retires, she will be able to quit with less stress. She is very pleased with her progress in the program and is walking more now for exercise. She plans to join [employer] to continue exercising. CR will f/u for one year.     Expected Outcomes  Patient will continue to exercise at [employer] and continue to meet her personal goals.        ITP Comments: ITP Comments    Row Name 12/22/17 1405 02/02/18 1556         ITP Comments  Patient new to program. Plans to start Monday 12/22/17.  Patient continues to smoke on average 3 cigarettes/day but says she does smoke more if she has a stressful day. Will continue to monitor.          Comments: Patient graduated from [place] today on 03/11/18 after completing 30 sessions. She achieved LTG of 30 minutes of aerobic exercise at Max Met level of 5.2. All patients vitals are WNL. Patient has met with dietician.  Discharge instruction has been reviewed in detail and patient stated an understanding of material given. Patient plans to continue exercising at [employer]. Cardiac Rehab staff will make f/u calls at 1 month, 6 months, and 1 year. Patient had no complaints of any abnormal S/S or pain on their exit visit.

## 2018-04-28 IMAGING — NM NM MYOCAR MULTI W/SPECT W/WALL MOTION & EF
2 series · 12 of 12 positions shown · non-contrast
Comparison: none

[Series 1: rest · 6.51mm/px · 6 of 64 frames shown]
[frame 6/64]
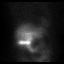
[frame 16/64]
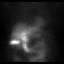
[frame 27/64]
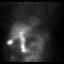
[frame 38/64]
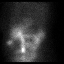
[frame 48/64]
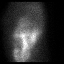
[frame 59/64]
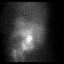

[Series 3: stress gated - perfusion · 6.51mm/px · 6 of 64 frames shown]
[frame 6/64]
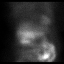
[frame 16/64]
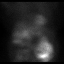
[frame 27/64]
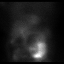
[frame 38/64]
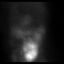
[frame 48/64]
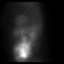
[frame 59/64]
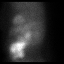

[12 of 12 positions shown; findings below may reference images not displayed]

Canned report from images found in remote index.

Refer to host system for actual result text.

## 2018-05-05 NOTE — Progress Notes (Deleted)
Cardiology Office Note  Date: 05/05/2018   ID: REMEE Douglas, DOB 1950-11-17, MRN [id number]  PCP: Bennie Pierini, FNP  Primary Cardiologist: Nona Dell, MD   No chief complaint on file.   History of Present Illness: Stacy Douglas is a 68 y.o. female last seen in August 2019.  Past Medical History:  Diagnosis Date  . Coronary atherosclerosis of native coronary artery    a. VF arrest in [place] s/p BMS LAD 5/11, PTCA circumflex 6/11, LVEF 40%. b. [place] 10/2014 s/p DES to prox and mid RCA, otherwise patent LAD stent and moderate disease in the Cx, LVEF 55-65%. c. low-risk NST in 06/2017 d. 09/2017: NSTEMI with cath showing 99% mid-LCx stenosis --> treated with DES; patent stents along LAD and RCA.   Marland Kitchen DVT (deep venous thrombosis) (HCC) 1992   LLE  . Essential hypertension   . History of duodenal ulcer 1960s  . Mixed hyperlipidemia   . Myocardial infarction (HCC) 08/2009   complicated by VF arrest  . NSTEMI (non-ST elevated myocardial infarction) (HCC) 10/07/2017   [name] 10/07/2017  . Obesity   . Plavix resistance   . Tobacco abuse     Past Surgical History:  Procedure Laterality Date  . APPENDECTOMY    . BREAST BIOPSY Right   . CARDIAC CATHETERIZATION N/A 11/16/2014   Procedure: Left Heart Cath and Coronary Angiography;  Surgeon: Iran Ouch, MD;  Location: [facility] INVASIVE CV LAB;  Service: Cardiovascular;  Laterality: N/A;  . CARDIAC CATHETERIZATION N/A 11/16/2014   Procedure: Coronary Stent Intervention;  Surgeon: Iran Ouch, MD;  Location: [facility] INVASIVE CV LAB;  Service: Cardiovascular;  Laterality: N/A;  . CATARACT EXTRACTION W/ INTRAOCULAR LENS IMPLANT Right 1990s  . CORONARY ANGIOPLASTY WITH STENT PLACEMENT  2011  . CORONARY ANGIOPLASTY WITH STENT PLACEMENT  10/07/2017  . CORONARY STENT INTERVENTION N/A 10/07/2017   Procedure: CORONARY STENT INTERVENTION;  Surgeon: Swaziland, Peter M, MD;  Location: [facility] INVASIVE CV LAB;  Service: Cardiovascular;  Laterality:  N/A;  . LEFT HEART CATH AND CORONARY ANGIOGRAPHY N/A 10/07/2017   Procedure: LEFT HEART CATH AND CORONARY ANGIOGRAPHY;  Surgeon: Swaziland, Peter M, MD;  Location: [facility] INVASIVE CV LAB;  Service: Cardiovascular;  Laterality: N/A;  . MASS EXCISION  1980s   "tooks lumps off my neck; front and back"    Current Outpatient Medications  Medication Sig Dispense Refill  . aspirin EC 81 MG tablet Take 81 mg by mouth daily.    . metoprolol succinate (TOPROL XL) 25 MG 24 hr tablet Take 0.5 tablets (12.5 mg total) by mouth daily. 45 tablet 3  . nitroGLYCERIN (NITROSTAT) 0.4 MG SL tablet Place 1 tablet (0.4 mg total) under the tongue every 5 (five) minutes x 3 doses as needed. 25 tablet 3  . prasugrel (EFFIENT) 10 MG TABS tablet TAKE 1 TABLET BY MOUTH ONCE DAILY 90 tablet 1   No current facility-administered medications for this visit.    Allergies:  Crestor [rosuvastatin calcium]; Plavix [clopidogrel bisulfate]; and Codeine   Social History: The patient  reports that she has been smoking cigarettes. She started smoking about 53 years ago. She has a 0.53 pack-year smoking history. She has never used smokeless tobacco. She reports previous alcohol use. She reports previous drug use.   Family History: The patient's family history includes Coronary artery disease in her father; Diverticulitis in her mother.   ROS:  Please see the history of present illness. Otherwise, complete review of systems is positive for {NONE DEFAULTED:18576::"none"}.  All other systems are reviewed and negative.   Physical Exam: VS:  There were no vitals taken for this visit., BMI There is no height or weight on file to calculate BMI.  Wt Readings from Last 3 Encounters:  03/02/18 197 lb 5 oz (89.5 kg)  12/26/17 199 lb (90.3 kg)  12/18/17 199 lb 6.4 oz (90.4 kg)    General: Patient appears comfortable at rest. HEENT: Conjunctiva and lids normal, oropharynx clear with moist mucosa. Neck: Supple, no elevated JVP or carotid bruits,  no thyromegaly. Lungs: Clear to auscultation, nonlabored breathing at rest. Cardiac: Regular rate and rhythm, no S3 or significant systolic murmur, no pericardial rub. Abdomen: Soft, nontender, no hepatomegaly, bowel sounds present, no guarding or rebound. Extremities: No pitting edema, distal pulses 2+. Skin: Warm and dry. Musculoskeletal: No kyphosis. Neuropsychiatric: Alert and oriented x3, affect grossly appropriate.  ECG: I personally reviewed the tracing from 10/21/2017 which showed sinus rhythm with low voltage, left anterior fascicular block, nonspecific T wave changes.  Recent Labwork: 07/01/2017: TSH 2.159 10/07/2017: ALT 23; AST 46 10/08/2017: BUN 9; Creatinine, Ser 0.88; Hemoglobin 13.7; Platelets 271; Potassium 4.0; Sodium 142     Component Value Date/Time   CHOL 149 12/18/2017 1202   TRIG 91 12/18/2017 1202   TRIG 118 11/02/2013 1428   HDL 45 (L) 12/18/2017 1202   HDL 41 11/02/2013 1428   CHOLHDL 3.3 12/18/2017 1202   VLDL 23 10/08/2017 0510   LDLCALC 85 12/18/2017 1202   LDLCALC 103 (H) 11/02/2013 1428    Other Studies Reviewed Today:  Cardiac catheterization and PCI 10/07/2017:  Prox RCA to Mid RCA lesion is 10% stenosed.  Dist RCA lesion is 50% stenosed.  Acute Mrg lesion is 75% stenosed.  Prox LAD to Mid LAD lesion is 10% stenosed.  Mid LAD lesion is 30% stenosed.  Prox Cx-1 lesion is 20% stenosed.  Prox Cx-2 lesion is 99% stenosed.  Post intervention, there is a 0% residual stenosis.  A drug-eluting stent was successfully placed using a STENT SYNERGY DES 2.5X12.  The left ventricular systolic function is normal.  LV end diastolic pressure is normal.  The left ventricular ejection fraction is 50-55% by visual estimate.  1. Single vessel obstructive CAD- 99% mid LCx 2. Prior stents in LAD and RCA are patent. 3. Low normal LV function. EF 50-55% with inferobasal HK. 4. Normal LVEDP 5. Successful stenting of the mid LCx with DES  Assessment and  Plan:   Current medicines were reviewed with the patient today.  No orders of the defined types were placed in this encounter.   Disposition:  Signed, Jonelle Sidle, MD, [facility] 05/05/2018 12:56 PM    [facility] at [facility] [address] [place], [place] [address] Phone: [phone]; Fax: [phone]

## 2018-05-06 ENCOUNTER — Ambulatory Visit: Payer: BLUE CROSS/BLUE SHIELD | Admitting: Cardiology

## 2018-06-29 ENCOUNTER — Ambulatory Visit: Payer: BLUE CROSS/BLUE SHIELD | Admitting: Cardiology

## 2018-06-29 ENCOUNTER — Encounter: Payer: Self-pay | Admitting: Cardiology

## 2018-06-29 VITALS — BP 130/84 | HR 95 | Ht 64.0 in | Wt 202.8 lb

## 2018-06-29 DIAGNOSIS — E782 Mixed hyperlipidemia: Secondary | ICD-10-CM

## 2018-06-29 DIAGNOSIS — Z789 Other specified health status: Secondary | ICD-10-CM | POA: Diagnosis not present

## 2018-06-29 DIAGNOSIS — I10 Essential (primary) hypertension: Secondary | ICD-10-CM | POA: Diagnosis not present

## 2018-06-29 DIAGNOSIS — Z720 Tobacco use: Secondary | ICD-10-CM

## 2018-06-29 DIAGNOSIS — I25119 Atherosclerotic heart disease of native coronary artery with unspecified angina pectoris: Secondary | ICD-10-CM | POA: Diagnosis not present

## 2018-06-29 NOTE — Progress Notes (Signed)
Cardiology Office Note  Date: 06/29/2018   ID: INOLA LISLE, DOB [birth year], MRN [id number]  PCP: Bennie Pierini, FNP  Primary Cardiologist: Nona Dell, MD   Chief Complaint  Patient presents with  . Coronary Artery Disease    History of Present Illness: Stacy Douglas is a 68 y.o. female last seen in August 2019.  She presents for a routine follow-up visit.  Since last assessment she does not report any angina symptoms or nitroglycerin use.  She has been steadily cutting back on smoking, is down to 3 cigarettes a day.  She plans to quit sometime when she retires from [employer] in May.  She joined [employer], has been trying to do some walking and circuit training for exercise.  I reviewed her medications which are outlined below.  She reports no spontaneous bleeding problems on dual antiplatelet therapy.  We anticipate stopping Effient in June of this year.  As mentioned previously she has a statin intolerance and also did not tolerate Zetia.  She is not interested in PCSK9 inhibitor.  Past Medical History:  Diagnosis Date  . Coronary atherosclerosis of native coronary artery    a. VF arrest in [place] s/p BMS LAD 5/11, PTCA circumflex 6/11, LVEF 40%. b. [place] 10/2014 s/p DES to prox and mid RCA, otherwise patent LAD stent and moderate disease in the Cx, LVEF 55-65%. c. low-risk NST in 06/2017 d. 09/2017: NSTEMI with cath showing 99% mid-LCx stenosis --> treated with DES; patent stents along LAD and RCA.   Marland Kitchen DVT (deep venous thrombosis) (HCC) 1992   LLE  . Essential hypertension   . History of duodenal ulcer 1960s  . Mixed hyperlipidemia   . Myocardial infarction (HCC) 08/2009   complicated by VF arrest  . NSTEMI (non-ST elevated myocardial infarction) (HCC) 10/07/2017   [name] 10/07/2017  . Obesity   . Plavix resistance   . Tobacco abuse     Past Surgical History:  Procedure Laterality Date  . APPENDECTOMY    . BREAST BIOPSY Right   . CARDIAC  CATHETERIZATION N/A 11/16/2014   Procedure: Left Heart Cath and Coronary Angiography;  Surgeon: Iran Ouch, MD;  Location: [facility] INVASIVE CV LAB;  Service: Cardiovascular;  Laterality: N/A;  . CARDIAC CATHETERIZATION N/A 11/16/2014   Procedure: Coronary Stent Intervention;  Surgeon: Iran Ouch, MD;  Location: [facility] INVASIVE CV LAB;  Service: Cardiovascular;  Laterality: N/A;  . CATARACT EXTRACTION W/ INTRAOCULAR LENS IMPLANT Right 1990s  . CORONARY ANGIOPLASTY WITH STENT PLACEMENT  2011  . CORONARY ANGIOPLASTY WITH STENT PLACEMENT  10/07/2017  . CORONARY STENT INTERVENTION N/A 10/07/2017   Procedure: CORONARY STENT INTERVENTION;  Surgeon: Swazil[place], Peter M, MD;  Location: [facility] INVASIVE CV LAB;  Service: Cardiovascular;  Laterality: N/A;  . LEFT HEART CATH AND CORONARY ANGIOGRAPHY N/A 10/07/2017   Procedure: LEFT HEART CATH AND CORONARY ANGIOGRAPHY;  Surgeon: [place]azil[place], Peter M, MD;  Location: [facility] INVASIVE CV LAB;  Service: Cardiovascular;  Laterality: N/A;  . MASS EXCISION  1980s   "tooks lumps off my neck; front and back"    Current Outpatient Medications  Medication Sig Dispense Refill  . aspirin EC 81 MG tablet Take 81 mg by mouth daily.    . metoprolol succinate (TOPROL XL) 25 MG 24 hr tablet Take 0.5 tablets (12.5 mg total) by mouth daily. 45 tablet 3  . nitroGLYCERIN (NITROSTAT) 0.4 MG SL tablet Place 1 tablet (0.4 mg total) under the tongue every 5 (five) minutes x 3 doses as  needed. 25 tablet 3  . prasugrel (EFFIENT) 10 MG TABS tablet TAKE 1 TABLET BY MOUTH ONCE DAILY 90 tablet 1   No current facility-administered medications for this visit.    Allergies:  Crestor [rosuvastatin calcium]; Plavix [clopidogrel bisulfate]; and Codeine   Social History: The patient  reports that she has been smoking cigarettes. She started smoking about 53 years ago. She has a 0.53 pack-year smoking history. She has never used smokeless tobacco. She reports previous alcohol use. She reports previous drug use.     ROS:  Please see the history of present illness. Otherwise, complete review of systems is positive for none.  All other systems are reviewed and negative.   Physical Exam: VS:  BP 130/84   Pulse 95   Ht 5\' 4"  (1.626 m)   Wt 202 lb 12.8 oz (92 kg)   SpO2 98% Comment: on room air  BMI 34.81 kg/m , BMI Body mass index is 34.81 kg/m.  Wt Readings from Last 3 Encounters:  06/29/18 202 lb 12.8 oz (92 kg)  03/02/18 197 lb 5 oz (89.5 kg)  12/26/17 199 lb (90.3 kg)    General: Patient appears comfortable at rest. HEENT: Conjunctiva and lids normal, oropharynx clear. Neck: Supple, no elevated JVP or carotid bruits, no thyromegaly. Lungs: Clear to auscultation, nonlabored breathing at rest. Cardiac: Regular rate and rhythm, no S3 or significant systolic murmur. Abdomen: Soft, nontender, bowel sounds present. Extremities: No pitting edema, distal pulses 2+. Skin: Warm and dry. Musculoskeletal: No kyphosis. Neuropsychiatric: Alert and oriented x3, affect grossly appropriate.  ECG: I personally reviewed the tracing from 10/21/2017 which showed sinus rhythm with low voltage, left anterior fascicular block, nonspecific T wave changes.  Recent Labwork: 07/01/2017: TSH 2.159 10/07/2017: ALT 23; AST 46 10/08/2017: BUN 9; Creatinine, Ser 0.88; Hemoglobin 13.7; Platelets 271; Potassium 4.0; Sodium 142     Component Value Date/Time   CHOL 149 12/18/2017 1202   TRIG 91 12/18/2017 1202   TRIG 118 11/02/2013 1428   HDL 45 (L) 12/18/2017 1202   HDL 41 11/02/2013 1428   CHOLHDL 3.3 12/18/2017 1202   VLDL 23 10/08/2017 0510   LDLCALC 85 12/18/2017 1202   LDLCALC 103 (H) 11/02/2013 1428    Other Studies Reviewed Today:  Cardiac catheterization and PCI 10/07/2017:  Prox RCA to Mid RCA lesion is 10% stenosed.  Dist RCA lesion is 50% stenosed.  Acute Mrg lesion is 75% stenosed.  Prox LAD to Mid LAD lesion is 10% stenosed.  Mid LAD lesion is 30% stenosed.  Prox Cx-1 lesion is 20%  stenosed.  Prox Cx-2 lesion is 99% stenosed.  Post intervention, there is a 0% residual stenosis.  A drug-eluting stent was successfully placed using a STENT SYNERGY DES 2.5X12.  The left ventricular systolic function is normal.  LV end diastolic pressure is normal.  The left ventricular ejection fraction is 50-55% by visual estimate.  1. Single vessel obstructive CAD- 99% mid LCx 2. Prior stents in LAD and RCA are patent. 3. Low normal LV function. EF 50-55% with inferobasal HK. 4. Normal LVEDP 5. Successful stenting of the mid LCx with DES  Assessment and Plan:  1.  Multivessel CAD status post BMS to the LAD, DES to the proximal and mid RCA, and angioplasty of the circumflex.  Most recent intervention was DES to the mid circumflex in June 2019 in the setting of NSTEMI.  She is clinically stable and we will continue dual antiplatelet therapy for now.  2.  Mixed  hyperlipidemia with statin intolerance and intolerance to Zetia.  She is not interested in PCSK9 inhibitor.  We have discussed diet and exercise.  Last LDL was 85.  3.  Essential hypertension, blood pressure is adequately controlled today.  No changes were made.  4. The patient was counseled on tobacco cessation today for 5 minutes.  Counseling included reviewing the risks of smoking tobacco products, how it impacts the patient's current medical diagnoses and different strategies for quitting.  Pharmacotherapy to aid in tobacco cessation was not prescribed today.  Continues to cut back with plan to quit sometime in May.  Current medicines were reviewed with the patient today.  Disposition: Follow-up in 6 months.  Signed, Jonelle Sidle, MD, [facility] 06/29/2018 4:32 PM    [facility] at [facility] [address] [place], [place] [address] Phone: [phone]; Fax: [phone]

## 2018-06-29 NOTE — Patient Instructions (Signed)
Medication Instructions: Your physician recommends that you continue on your current medications as directed. Please refer to the Current Medication list given to you today.   Labwork: None today  Procedures/Testing: None today  Follow-Up: 6 months with Dr.McDowell  Any Additional Special Instructions Will Be Listed Below (If Applicable).     If you need a refill on your cardiac medications before your next appointment, please call your pharmacy.   

## 2018-07-30 ENCOUNTER — Other Ambulatory Visit: Payer: Self-pay | Admitting: Physician Assistant

## 2018-08-01 IMAGING — DX DG CHEST 2V
2 series · 2 of 2 positions shown · non-contrast
Comparison: 08/12/2016

CLINICAL DATA: Chest pain

EXAM:
CHEST - 2 VIEW

[chest lat]
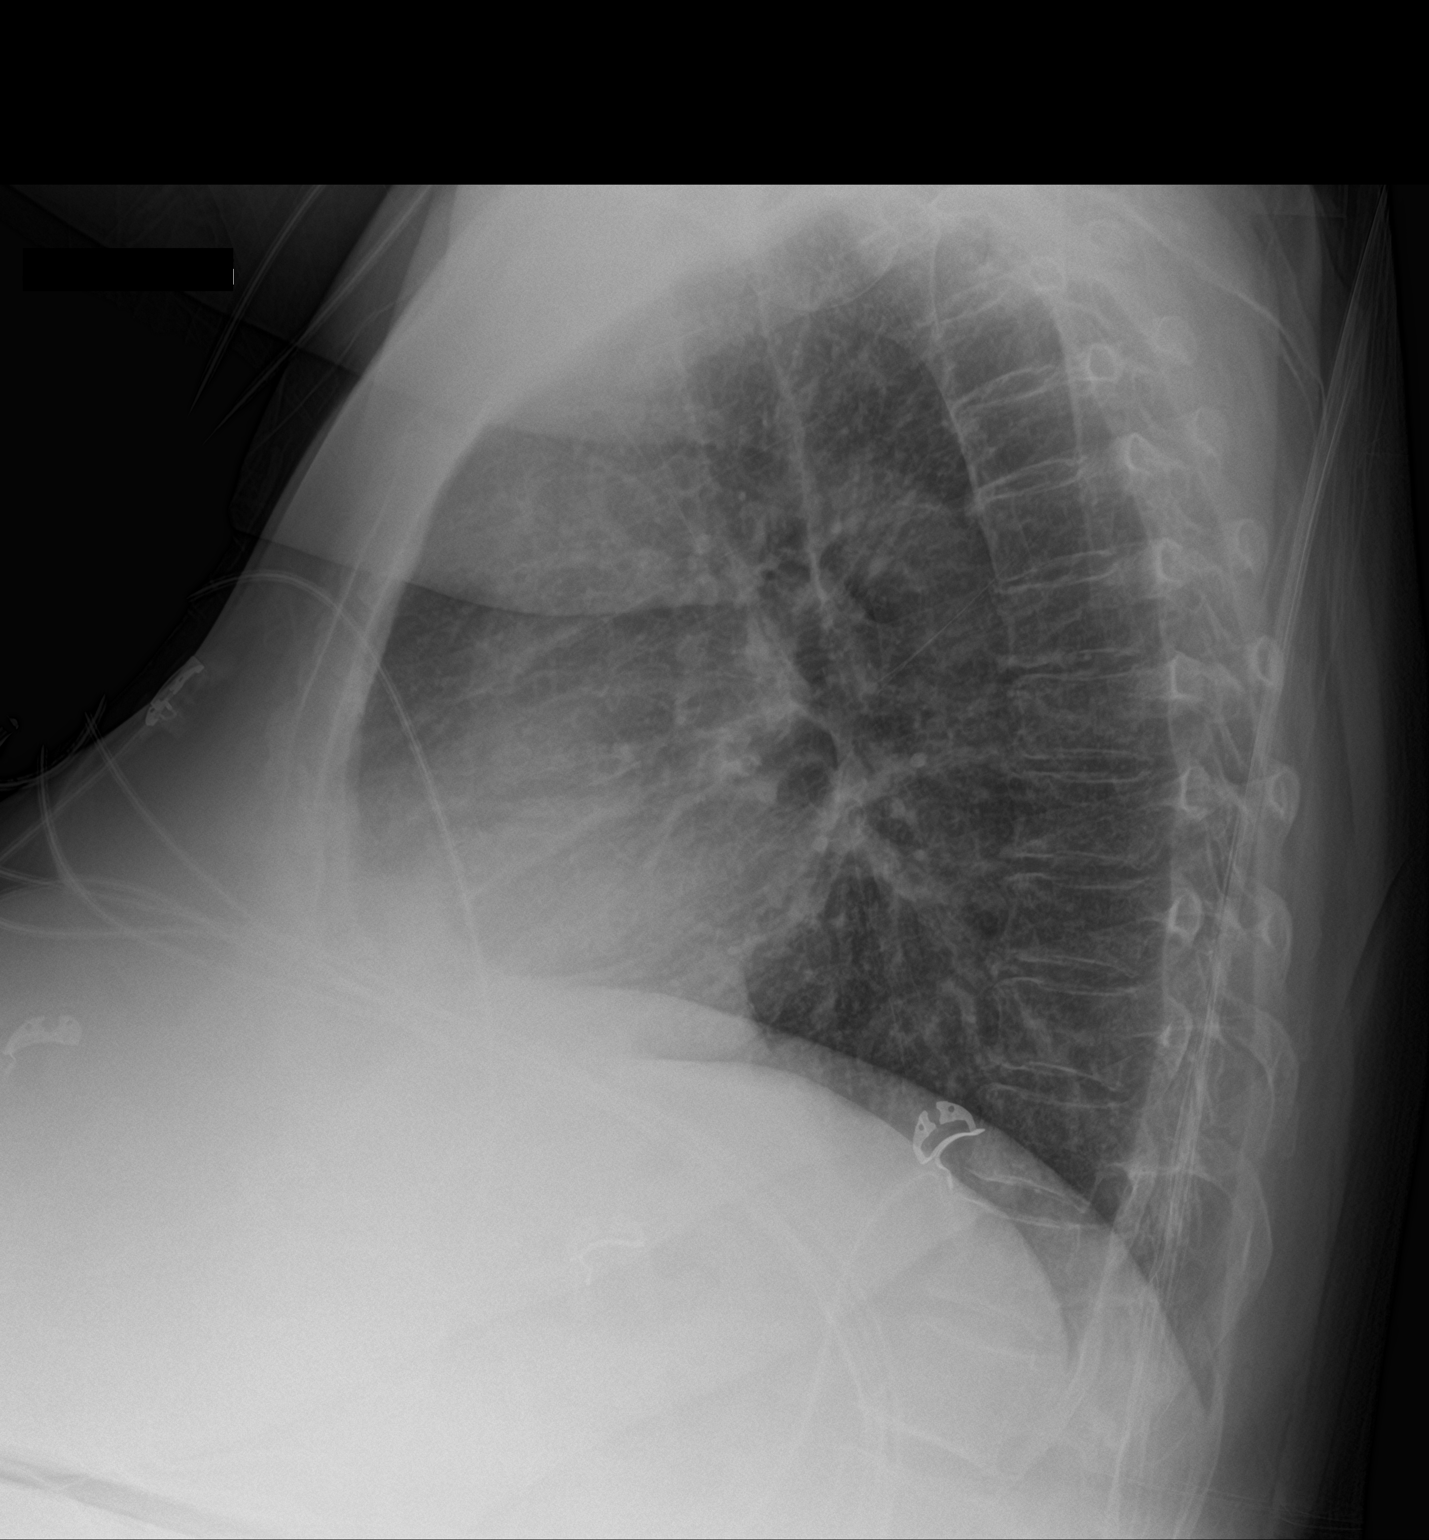

[chest ap]
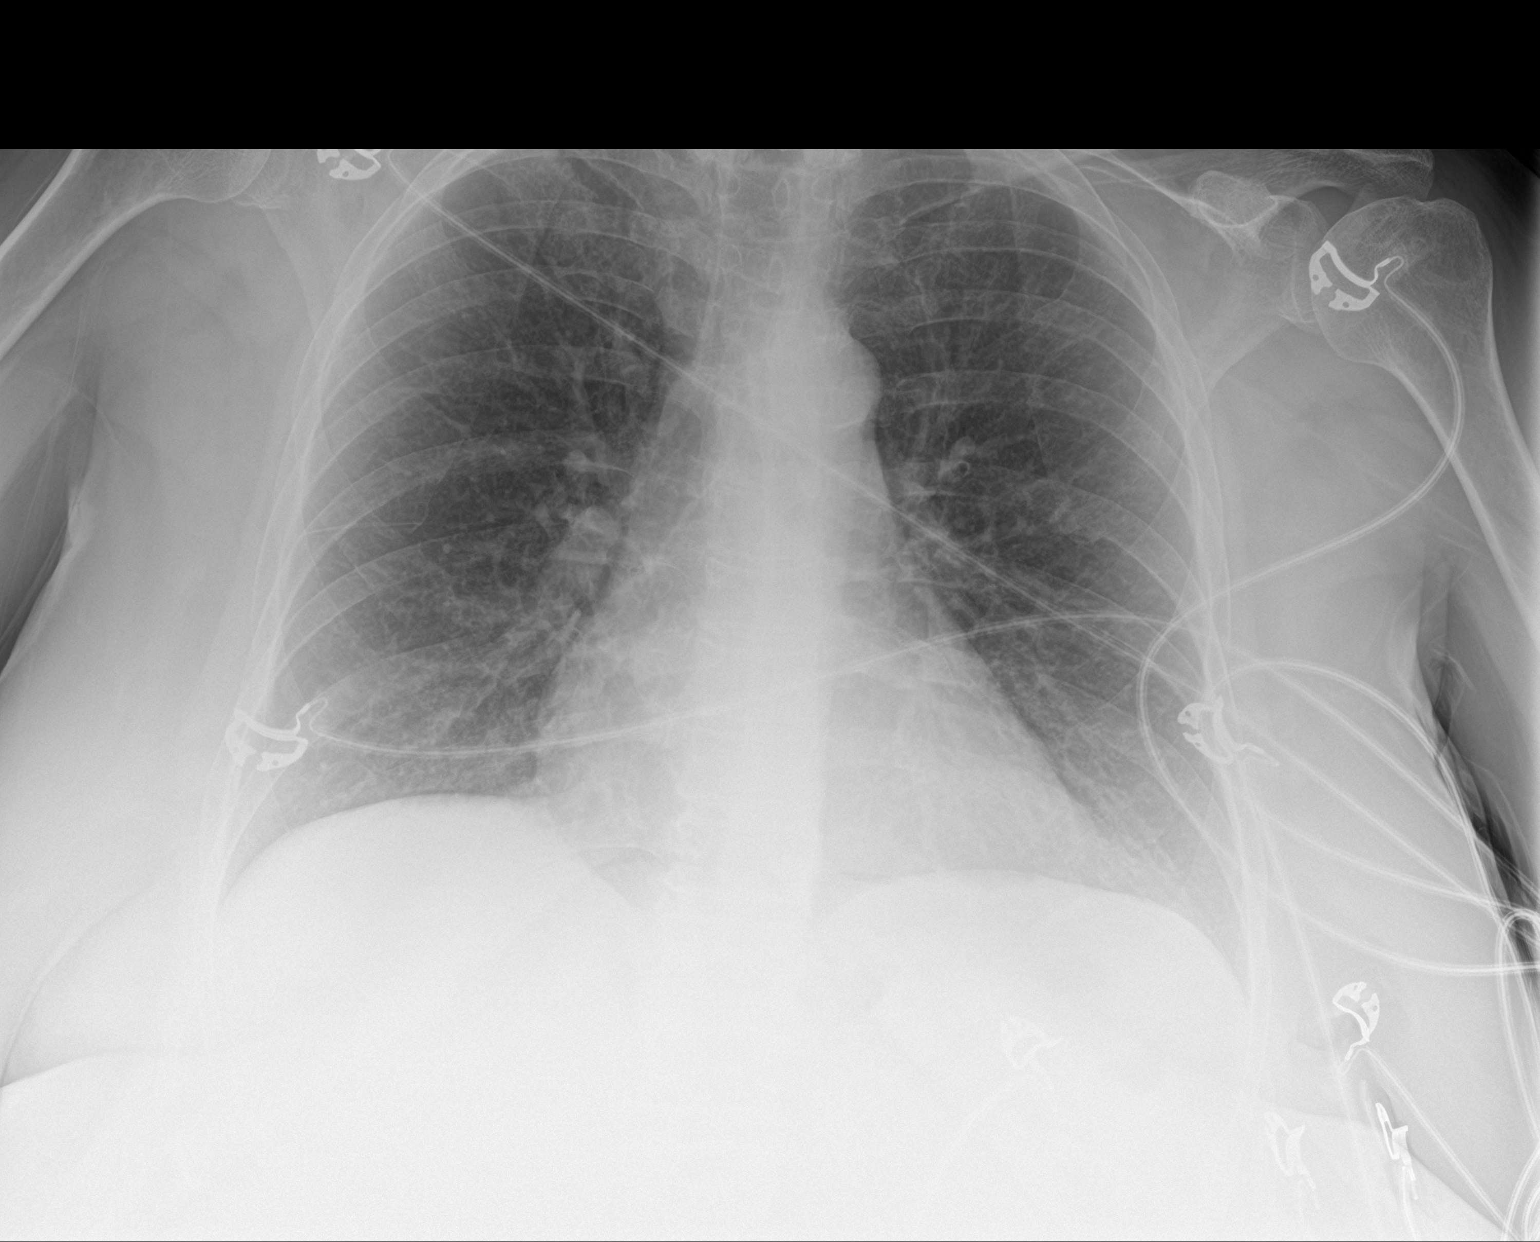

[2 of 2 positions shown; findings below may reference images not displayed]

FINDINGS: Lungs are clear.  No pleural effusion or pneumothorax.

The heart is normal in size.

Visualized osseous structures are within normal limits.
IMPRESSION: Normal chest radiographs.

## 2018-10-20 ENCOUNTER — Other Ambulatory Visit: Payer: Self-pay | Admitting: Student

## 2019-01-06 ENCOUNTER — Ambulatory Visit: Payer: BLUE CROSS/BLUE SHIELD | Admitting: Cardiology

## 2019-01-19 ENCOUNTER — Encounter: Payer: Self-pay | Admitting: Cardiology

## 2019-01-19 ENCOUNTER — Other Ambulatory Visit: Payer: Self-pay | Admitting: Student

## 2019-01-19 ENCOUNTER — Ambulatory Visit: Payer: BC Managed Care – PPO | Admitting: Cardiology

## 2019-01-19 ENCOUNTER — Other Ambulatory Visit: Payer: Self-pay

## 2019-01-19 VITALS — BP 135/77 | HR 73 | Temp 96.8°F | Ht 64.0 in | Wt 212.0 lb

## 2019-01-19 DIAGNOSIS — I25119 Atherosclerotic heart disease of native coronary artery with unspecified angina pectoris: Secondary | ICD-10-CM | POA: Diagnosis not present

## 2019-01-19 DIAGNOSIS — E782 Mixed hyperlipidemia: Secondary | ICD-10-CM | POA: Diagnosis not present

## 2019-01-19 DIAGNOSIS — I10 Essential (primary) hypertension: Secondary | ICD-10-CM | POA: Diagnosis not present

## 2019-01-19 DIAGNOSIS — Z720 Tobacco use: Secondary | ICD-10-CM | POA: Diagnosis not present

## 2019-01-19 MED ORDER — PRASUGREL HCL 10 MG PO TABS: 10.0000 mg | ORAL_TABLET | Freq: Every day | ORAL | 3 refills | Status: DC

## 2019-01-19 NOTE — Progress Notes (Signed)
Cardiology Office Note  Date: 01/19/2019   ID: Aaria, Happ 1951-02-05, MRN [id number]  PCP:  Chevis Pretty, FNP  Cardiologist:  Rozann Lesches, MD Electrophysiologist:  None   Chief Complaint  Patient presents with  . Cardiac follow-up    History of Present Illness: Stacy Douglas is a 68 y.o. female last seen in March.  She presents for a follow-up visit.  Since last assessment she does not report any angina symptoms or nitroglycerin use.  She continues to work full-time at [employer], although anticipates being laid off, the company will ultimately go up for auction.  I reviewed her medications which are outlined below.  She did run out of Effient.  We will plan to refill this and continue DAPT long-term in light of number of DES interventions.  She also has Plavix resistance.  She is not on statin therapy given intolerances and also could not take Zetia due to intolerances.  She is not interested in other therapies.  I personally reviewed her ECG today which shows normal sinus rhythm with low voltage.  Past Medical History:  Diagnosis Date  . Coronary atherosclerosis of native coronary artery    a. VF arrest in [place] s/p BMS LAD 5/11, PTCA circumflex 6/11, LVEF 40%. b. [place] 10/2014 s/p DES to prox and mid RCA, otherwise patent LAD stent and moderate disease in the Cx, LVEF 55-65%. c. low-risk NST in 06/2017 d. 09/2017: NSTEMI with cath showing 99% mid-LCx stenosis --> treated with DES; patent stents along LAD and RCA.   Marland Kitchen DVT (deep venous thrombosis) (HCC) 1992   LLE  . Essential hypertension   . History of duodenal ulcer 1960s  . Mixed hyperlipidemia   . Myocardial infarction ([facility]) 66/0630   complicated by VF arrest  . NSTEMI (non-ST elevated myocardial infarction) ([facility]) 10/07/2017   Archie Endo 10/07/2017  . Obesity   . Plavix resistance   . Tobacco abuse     Past Surgical History:  Procedure Laterality Date  . APPENDECTOMY    . BREAST BIOPSY Right   .  CARDIAC CATHETERIZATION N/A 11/16/2014   Procedure: Left Heart Cath and Coronary Angiography;  Surgeon: Wellington Hampshire, MD;  Location: [facility];  Service: Cardiovascular;  Laterality: N/A;  . CARDIAC CATHETERIZATION N/A 11/16/2014   Procedure: Coronary Stent Intervention;  Surgeon: Wellington Hampshire, MD;  Location: [facility] [facility];  Service: Cardiovascular;  Laterality: N/A;  . CATARACT EXTRACTION W/ INTRAOCULAR LENS IMPLANT Right 1990s  . CORONARY ANGIOPLASTY WITH STENT PLACEMENT  2011  . CORONARY ANGIOPLASTY WITH STENT PLACEMENT  10/07/2017  . CORONARY STENT INTERVENTION N/A 10/07/2017   Procedure: CORONARY STENT INTERVENTION;  Surgeon: Martinique, Peter M, MD;  Location: [facility];  Service: Cardiovascular;  Laterality: N/A;  . LEFT HEART CATH AND CORONARY ANGIOGRAPHY N/A 10/07/2017   Procedure: LEFT HEART CATH AND CORONARY ANGIOGRAPHY;  Surgeon: Martinique, Peter M, MD;  Location: [facility];  Service: Cardiovascular;  Laterality: N/A;  . MASS EXCISION  1980s   "tooks lumps off my neck; front and back"    Current Outpatient Medications  Medication Sig Dispense Refill  . aspirin EC 81 MG tablet Take 81 mg by mouth daily.    . metoprolol succinate (TOPROL-XL) 25 MG 24 hr tablet TAKE 1/2 TABLET (12.5 MG TOTAL) BY MOUTH DAILY. 45 tablet 1  . nitroGLYCERIN (NITROSTAT) 0.4 MG SL tablet Place 1 tablet (0.4 mg total) under the tongue every 5 (five) minutes x  3 doses as needed. 25 tablet 3  . prasugrel (EFFIENT) 10 MG TABS tablet Take 1 tablet (10 mg total) by mouth daily. 90 tablet 3   No current facility-administered medications for this visit.    Allergies:  Crestor [rosuvastatin calcium], Plavix [clopidogrel bisulfate], and Codeine   Social History: The patient  reports that she has been smoking cigarettes. She started smoking about 54 years ago. She has a 0.53 pack-year smoking history. She has never used smokeless tobacco. She reports previous alcohol use. She reports  previous drug use.   ROS:  Please see the history of present illness. Otherwise, complete review of systems is positive for none.  All other systems are reviewed and negative.   Physical Exam: VS:  BP 135/77   Pulse 73   Temp (!) 96.8 F (36 C)   Ht 5\' 4"  (1.626 m)   Wt 212 lb (96.2 kg)   BMI 36.39 kg/m , BMI Body mass index is 36.39 kg/m.  Wt Readings from Last 3 Encounters:  01/19/19 212 lb (96.2 kg)  06/29/18 202 lb 12.8 oz (92 kg)  03/02/18 197 lb 5 oz (89.5 kg)    General: Patient appears comfortable at rest. HEENT: Conjunctiva and lids normal, oropharynx clear. Neck: Supple, no elevated JVP or carotid bruits, no thyromegaly. Lungs: Clear to auscultation, nonlabored breathing at rest. Cardiac: Regular rate and rhythm, no S3 or significant systolic murmur. Abdomen: Soft, nontender, bowel sounds present. Extremities: No pitting edema, distal pulses 2+. Skin: Warm and dry. Musculoskeletal: No kyphosis. Neuropsychiatric: Alert and oriented x3, affect grossly appropriate.  ECG:  An ECG dated 10/21/2017 was personally reviewed today and demonstrated:  Sinus rhythm with low voltage, left anterior fascicular block, nonspecific T wave changes.  Recent Labwork:    Component Value Date/Time   CHOL 149 12/18/2017 1202   TRIG 91 12/18/2017 1202   TRIG 118 11/02/2013 1428   HDL 45 (L) 12/18/2017 1202   HDL 41 11/02/2013 1428   CHOLHDL 3.3 12/18/2017 1202   VLDL 23 10/08/2017 0510   LDLCALC 85 12/18/2017 1202   LDLCALC 103 (H) 11/02/2013 1428  10/07/2017: ALT 23; AST 46 10/08/2017: BUN 9; Creatinine, Ser 0.88; Hemoglobin 13.7; Platelets 271; Potassium 4.0; Sodium 142   Other Studies Reviewed Today:  Cardiac catheterization and PCI 10/07/2017:  Prox RCA to Mid RCA lesion is 10% stenosed.  Dist RCA lesion is 50% stenosed.  Acute Mrg lesion is 75% stenosed.  Prox LAD to Mid LAD lesion is 10% stenosed.  Mid LAD lesion is 30% stenosed.  Prox Cx-1 lesion is 20% stenosed.   Prox Cx-2 lesion is 99% stenosed.  Post intervention, there is a 0% residual stenosis.  A drug-eluting stent was successfully placed using a STENT SYNERGY DES 2.5X12.  The left ventricular systolic function is normal.  LV end diastolic pressure is normal.  The left ventricular ejection fraction is 50-55% by visual estimate.  1. Single vessel obstructive CAD- 99% mid LCx 2. Prior stents in LAD and RCA are patent. 3. Low normal LV function. EF 50-55% with inferobasal HK. 4. Normal LVEDP 5. Successful stenting of the mid LCx with DES  Assessment and Plan:  1.  Multivessel CAD status post BMS to the LAD, DES to the proximal and mid RCA, and angioplasty of the circumflex with subsequent DES to the circumflex more recently in June 2019.  Plan is to continue dual antiplatelet therapy, refill provided for Effient.  2.  Mixed hyperlipidemia with known statin intolerance as well  as intolerance to Zetia.  She is not interested in PCSK9 inhibitor.  Continue with diet, last LDL was 85.  3.  Essential hypertension, systolic is in the 130s today.  No changes in current regimen which includes Toprol-XL.  4.  Tobacco abuse.  We have discussed smoking cessation.  She has been trying to cut back and quit eventually.  Medication Adjustments/Labs and Tests Ordered: Current medicines are reviewed at length with the patient today.  Concerns regarding medicines are outlined above.   Tests Ordered: Orders Placed This Encounter  Procedures  . EKG 12-Lead    Medication Changes: Meds ordered this encounter  Medications  . prasugrel (EFFIENT) 10 MG TABS tablet    Sig: Take 1 tablet (10 mg total) by mouth daily.    Dispense:  90 tablet    Refill:  3    Disposition:  Follow up 6 months in the [place] office.  Signed, Jonelle Sidle, MD, [facility] 01/19/2019 1:54 PM    [facility] [address] [place], [place] [address] Phone: [phone]; Fax: [phone]

## 2019-01-19 NOTE — Addendum Note (Signed)
Addended by: Barbarann Ehlers A on: 01/19/2019 02:30 PM   Modules accepted: Orders

## 2019-01-19 NOTE — Patient Instructions (Signed)
Medication Instructions: Your physician recommends that you continue on your current medications as directed. Please refer to the Current Medication list given to you today.   Labwork: None  Procedures/Testing: None  Follow-Up: 6 months office with Dr.McDowell  Any Additional Special Instructions Will Be Listed Below (If Applicable).     If you need a refill on your cardiac medications before your next appointment, please call your pharmacy.     Thank you for choosing [facility] !

## 2019-01-20 NOTE — Telephone Encounter (Signed)
Outpatient Medication Detail   Disp Refills Start End   prasugrel (EFFIENT) 10 MG TABS tablet 90 tablet 3 01/19/2019    Sig - Route: Take 1 tablet (10 mg total) by mouth daily. - Oral   Sent to pharmacy as: prasugrel (EFFIENT) 10 MG Tab tablet   E-Prescribing Status: Receipt confirmed by pharmacy (01/19/2019 2:30 PM EDT)   Pharmacy  [place], [place]

## 2019-02-07 ENCOUNTER — Other Ambulatory Visit: Payer: Self-pay | Admitting: Physician Assistant

## 2019-07-23 ENCOUNTER — Ambulatory Visit: Payer: BC Managed Care – PPO | Admitting: Cardiology

## 2019-08-12 ENCOUNTER — Other Ambulatory Visit: Payer: Self-pay | Admitting: Cardiology

## 2019-08-27 ENCOUNTER — Ambulatory Visit: Payer: Self-pay | Admitting: Cardiology

## 2019-11-23 ENCOUNTER — Telehealth: Payer: Self-pay | Admitting: Cardiology

## 2019-11-23 NOTE — Telephone Encounter (Signed)
  Patient Consent for Virtual Visit         Stacy Douglas has provided verbal consent on 11/23/2019 for a virtual visit (video or telephone).   CONSENT FOR VIRTUAL VISIT FOR:  Stacy Douglas  By participating in this virtual visit I agree to the following:  I hereby voluntarily request, consent and authorize [facility] and its employed or contracted physicians, physician assistants, nurse practitioners or other licensed health care professionals (the Practitioner), to provide me with telemedicine health care services (the "Services") as deemed necessary by the treating Practitioner. I acknowledge and consent to receive the Services by the Practitioner via telemedicine. I understand that the telemedicine visit will involve communicating with the Practitioner through live audiovisual communication technology and the disclosure of certain medical information by electronic transmission. I acknowledge that I have been given the opportunity to request an in-person assessment or other available alternative prior to the telemedicine visit and am voluntarily participating in the telemedicine visit.  I understand that I have the right to withhold or withdraw my consent to the use of telemedicine in the course of my care at any time, without affecting my right to future care or treatment, and that the Practitioner or I may terminate the telemedicine visit at any time. I understand that I have the right to inspect all information obtained and/or recorded in the course of the telemedicine visit and may receive copies of available information for a reasonable fee.  I understand that some of the potential risks of receiving the Services via telemedicine include:  Marland Kitchen Delay or interruption in medical evaluation due to technological equipment failure or disruption; . Information transmitted may not be sufficient (e.g. poor resolution of images) to allow for appropriate medical decision making by the Practitioner;  and/or  . In rare instances, security protocols could fail, causing a breach of personal health information.  Furthermore, I acknowledge that it is my responsibility to provide information about my medical history, conditions and care that is complete and accurate to the best of my ability. I acknowledge that Practitioner's advice, recommendations, and/or decision may be based on factors not within their control, such as incomplete or inaccurate data provided by me or distortions of diagnostic images or specimens that may result from electronic transmissions. I understand that the practice of medicine is not an exact science and that Practitioner makes no warranties or guarantees regarding treatment outcomes. I acknowledge that a copy of this consent can be made available to me via my patient portal [facility] MyChart), or I can request a printed copy by calling the office of [facility].    I understand that my insurance will be billed for this visit.   I have read or had this consent read to me. . I understand the contents of this consent, which adequately explains the benefits and risks of the Services being provided via telemedicine.  . I have been provided ample opportunity to ask questions regarding this consent and the Services and have had my questions answered to my satisfaction. . I give my informed consent for the services to be provided through the use of telemedicine in my medical care

## 2019-11-26 ENCOUNTER — Encounter: Payer: Self-pay | Admitting: Cardiology

## 2019-11-26 ENCOUNTER — Telehealth (INDEPENDENT_AMBULATORY_CARE_PROVIDER_SITE_OTHER): Payer: Self-pay | Admitting: Cardiology

## 2019-11-26 ENCOUNTER — Other Ambulatory Visit: Payer: Self-pay

## 2019-11-26 VITALS — BP 131/85 | HR 91 | Ht 64.0 in | Wt 195.0 lb

## 2019-11-26 DIAGNOSIS — E782 Mixed hyperlipidemia: Secondary | ICD-10-CM

## 2019-11-26 DIAGNOSIS — I25119 Atherosclerotic heart disease of native coronary artery with unspecified angina pectoris: Secondary | ICD-10-CM

## 2019-11-26 DIAGNOSIS — I10 Essential (primary) hypertension: Secondary | ICD-10-CM

## 2019-11-26 MED ORDER — METOPROLOL SUCCINATE ER 25 MG PO TB24: 25.0000 mg | ORAL_TABLET | Freq: Every day | ORAL | 3 refills | Status: DC

## 2019-11-26 NOTE — Patient Instructions (Signed)
Medication Instructions:  INCREASE TOPROL TO 25 MG DAILY  *If you need a refill on your cardiac medications before your next appointment, please call your pharmacy*   Lab Work: CBC,BMET,FASTING LIPIDS  If you have labs (blood work) drawn today and your tests are completely normal, you will receive your results only by: Marland Kitchen MyChart Message (if you have MyChart) OR . A paper copy in the mail If you have any lab test that is abnormal or we need to change your treatment, we will call you to review the results.   Testing/Procedures: NONE TODAY   Follow-Up: At [facility], you and your health needs are our priority.  As part of our continuing mission to provide you with exceptional heart care, we have created designated Provider Care Teams.  These Care Teams include your primary Cardiologist (physician) and Advanced Practice Providers (APPs -  Physician Assistants and Nurse Practitioners) who all work together to provide you with the care you need, when you need it.  We recommend signing up for the patient portal called "MyChart".  Sign up information is provided on this After Visit Summary.  MyChart is used to connect with patients for Virtual Visits (Telemedicine).  Patients are able to view lab/test results, encounter notes, upcoming appointments, etc.  Non-urgent messages can be sent to your provider as well.   To learn more about what you can do with MyChart, go to [email or web address].    Your next appointment:   6 month(s)  The format for your next appointment:   In Person  Provider:   Dr.Samuel Diona Browner   Other Instructions None       Thank you for choosing [facility] !

## 2019-11-26 NOTE — Addendum Note (Signed)
Addended by: Marlyn Corporal A on: 11/26/2019 02:43 PM   Modules accepted: Orders

## 2019-11-26 NOTE — Progress Notes (Signed)
Virtual Visit via Video Note   This visit type was conducted due to national recommendations for restrictions regarding the COVID-19 Pandemic (e.g. social distancing) in an effort to limit this patient's exposure and mitigate transmission in our community.  Due to her co-morbid illnesses, this patient is at least at moderate risk for complications without adequate follow up.  This format is felt to be most appropriate for this patient at this time.  All issues noted in this document were discussed and addressed.  A limited physical exam was performed with this format.  Please refer to the patient's chart for her consent to telehealth for Stacy Douglas.       Date:  11/26/2019   ID:  Stacy Douglas, DOB 1951/04/29, MRN [id number] The patient was identified using 2 identifiers.  Patient Location: Home Provider Location: Office/Clinic  PCP:  Bennie Pierini, FNP  Cardiologist:  Nona Dell, MD Electrophysiologist:  None   Evaluation Performed:  Follow-Up Visit  Chief Complaint:  Cardiac follow-up  History of Present Illness:    Stacy Douglas is a 69 y.o. female last seen in September 2020.  We communicated via video conferencing today.  She states that she has been doing well from a cardiac perspective, no active angina symptoms or nitroglycerin use.  Blood pressure has been trending up somewhat, systolics in the mid 130s over diastolics in the mid 80s.  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  She got laid off from [employer] this past October.  She has had the COVID-19 vaccine.  I reviewed her medications which are outlined below.  We did discuss increasing Toprol-XL to 25 mg daily.  We also continue with dual antiplatelet therapy, she denies any bleeding problems.   Past Medical History:  Diagnosis Date  . Coronary atherosclerosis of native coronary artery    a. VF arrest in [place] s/p BMS LAD 5/11, PTCA circumflex  6/11, LVEF 40%. b. [place] 10/2014 s/p DES to prox and mid RCA, otherwise patent LAD stent and moderate disease in the Cx, LVEF 55-65%. c. low-risk NST in 06/2017 d. 09/2017: NSTEMI with cath showing 99% mid-LCx stenosis --> treated with DES; patent stents along LAD and RCA.   Marland Kitchen DVT (deep venous thrombosis) (HCC) 1992   LLE  . Essential hypertension   . History of duodenal ulcer 1960s  . Mixed hyperlipidemia   . Myocardial infarction ([facility]) 08/2009   complicated by VF arrest  . NSTEMI (non-ST elevated myocardial infarction) (HCC) 10/07/2017   Stacy Douglas 10/07/2017  . Obesity   . Plavix resistance   . Tobacco abuse    Past Surgical History:  Procedure Laterality Date  . APPENDECTOMY    . BREAST BIOPSY Right   . CARDIAC CATHETERIZATION N/A 11/16/2014   Procedure: Left Heart Cath and Coronary Angiography;  Surgeon: Iran Ouch, MD;  Location: [facility];  Service: Cardiovascular;  Laterality: N/A;  . CARDIAC CATHETERIZATION N/A 11/16/2014   Procedure: Coronary Stent Intervention;  Surgeon: Iran Ouch, MD;  Location: [facility];  Service: Cardiovascular;  Laterality: N/A;  . CATARACT EXTRACTION W/ INTRAOCULAR LENS IMPLANT Right 1990s  . CORONARY ANGIOPLASTY WITH STENT PLACEMENT  2011  . CORONARY ANGIOPLASTY WITH STENT PLACEMENT  10/07/2017  . CORONARY STENT INTERVENTION N/A 10/07/2017   Procedure: CORONARY STENT INTERVENTION;  Surgeon: Swazil[place], Peter M, MD;  Location: [facility];  Service: Cardiovascular;  Laterality: N/A;  . LEFT  HEART CATH AND CORONARY ANGIOGRAPHY N/A 10/07/2017   Procedure: LEFT HEART CATH AND CORONARY ANGIOGRAPHY;  Surgeon: Swaziland, Peter M, MD;  Location: [facility];  Service: Cardiovascular;  Laterality: N/A;  . MASS EXCISION  1980s   "tooks lumps off my neck; front and back"     Current Meds  Medication Sig  . aspirin EC 81 MG tablet Take 81 mg by mouth daily.  . nitroGLYCERIN (NITROSTAT) 0.4 MG SL tablet Place 1 tablet (0.4 mg total)  under the tongue every 5 (five) minutes x 3 doses as needed.  . prasugrel (EFFIENT) 10 MG TABS tablet Take 1 tablet (10 mg total) by mouth daily.  . [DISCONTINUED] metoprolol succinate (TOPROL-XL) 25 MG 24 hr tablet TAKE 1/2 TABLET BY MOUTH DAILY.     Allergies:   Crestor [rosuvastatin calcium], Plavix [clopidogrel bisulfate], and Codeine   ROS:   No palpitations or syncope.  Prior CV studies:   The following studies were reviewed today:  Cardiac catheterization and PCI 10/07/2017:  Prox RCA to Mid RCA lesion is 10% stenosed.  Dist RCA lesion is 50% stenosed.  Acute Mrg lesion is 75% stenosed.  Prox LAD to Mid LAD lesion is 10% stenosed.  Mid LAD lesion is 30% stenosed.  Prox Cx-1 lesion is 20% stenosed.  Prox Cx-2 lesion is 99% stenosed.  Post intervention, there is a 0% residual stenosis.  A drug-eluting stent was successfully placed using a STENT SYNERGY DES 2.5X12.  The left ventricular systolic function is normal.  LV end diastolic pressure is normal.  The left ventricular ejection fraction is 50-55% by visual estimate.  1. Single vessel obstructive CAD- 99% mid LCx 2. Prior stents in LAD and RCA are patent. 3. Low normal LV function. EF 50-55% with inferobasal HK. 4. Normal LVEDP 5. Successful stenting of the mid LCx with DES  Labs/Other Tests and Data Reviewed:    EKG:  An ECG dated 01/19/2019 was personally reviewed today and demonstrated:  Normal sinus rhythm with low voltage.  Recent Labs:  No interval lab work for review today.  Wt Readings from Last 3 Encounters:  11/26/19 195 lb (88.5 kg)  01/19/19 212 lb (96.2 kg)  06/29/18 202 lb 12.8 oz (92 kg)     Objective:    Vital Signs:  BP (!) 131/85   Pulse 91   Ht 5\' 4"  (1.626 m)   Wt 195 lb (88.5 kg)   BMI 33.47 kg/m    General: Patient appears comfortable at rest. HEENT: Conjunctiva and lids normal. Skin: Normal appearance of color and turgor. Lungs: Patient spoke in full sentences, not  short of breath. Neuropsychiatric: Patient alert and with normal affect.  Observed to stand up and ambulate  ASSESSMENT & PLAN:    1.  Multivessel CAD status post BMS to the LAD, DES to the proximal and mid RCA, and angioplasty of the circumflex with subsequent DES to the circumflex as of June 2019.  She remains clinically stable without active angina on medical therapy.  Continue aspirin, Effient, Toprol-XL, and as needed nitroglycerin.  Check CBC.  2.  Mixed hyperlipidemia.  She has a history of statin intolerance as well as intolerance to Zetia.  We have discussed other options, she has not been interested in pursuing PCSK9 inhibitors.  We will follow-up lipid profile.  3.  Essential hypertension, increase Toprol-XL to 25 mg daily for now and observe.   Time:   Today, I have spent 8 minutes with the patient with telehealth  technology discussing the above problems.     Medication Adjustments/Labs and Tests Ordered: Current medicines are reviewed at length with the patient today.  Concerns regarding medicines are outlined above.   Tests Ordered: Orders Placed This Encounter  Procedures  . CBC  . Comprehensive metabolic panel  . Lipid Profile    Medication Changes: No orders of the defined types were placed in this encounter.   Follow Up:  In Person 6 months in the [place] office.  Signed, Nona Dell, MD  11/26/2019 2:34 PM    [facility]

## 2020-05-04 ENCOUNTER — Other Ambulatory Visit: Payer: Self-pay | Admitting: Cardiology

## 2020-05-17 DIAGNOSIS — E782 Mixed hyperlipidemia: Secondary | ICD-10-CM | POA: Diagnosis not present

## 2020-05-17 DIAGNOSIS — I25119 Atherosclerotic heart disease of native coronary artery with unspecified angina pectoris: Secondary | ICD-10-CM | POA: Diagnosis not present

## 2020-05-17 DIAGNOSIS — I10 Essential (primary) hypertension: Secondary | ICD-10-CM | POA: Diagnosis not present

## 2020-05-18 LAB — COMPREHENSIVE METABOLIC PANEL
ALT: 9 IU/L (ref 0–32)
AST: 16 IU/L (ref 0–40)
Albumin/Globulin Ratio: 1.6 (ref 1.2–2.2)
Albumin: 4.4 g/dL (ref 3.8–4.8)
Alkaline Phosphatase: 86 IU/L (ref 44–121)
BUN/Creatinine Ratio: 12 (ref 12–28)
BUN: 11 mg/dL (ref 8–27)
Bilirubin Total: 0.3 mg/dL (ref 0.0–1.2)
CO2: 23 mmol/L (ref 20–29)
Calcium: 9.5 mg/dL (ref 8.7–10.3)
Chloride: 102 mmol/L (ref 96–106)
Creatinine, Ser: 0.9 mg/dL (ref 0.57–1.00)
GFR calc Af Amer: 77 mL/min/{1.73_m2} (ref >59)
GFR calc non Af Amer: 66 mL/min/{1.73_m2} (ref >59)
Globulin, Total: 2.7 g/dL (ref 1.5–4.5)
Glucose: 84 mg/dL (ref 65–99)
Potassium: 4.4 mmol/L (ref 3.5–5.2)
Sodium: 141 mmol/L (ref 134–144)
Total Protein: 7.1 g/dL (ref 6.0–8.5)

## 2020-05-18 LAB — LIPID PANEL
Chol/HDL Ratio: 4.7 ratio — ABNORMAL HIGH (ref 0.0–4.4)
Cholesterol, Total: 198 mg/dL (ref 100–199)
HDL: 42 mg/dL (ref >39)
LDL Chol Calc (NIH): 131 mg/dL — ABNORMAL HIGH (ref 0–99)
Triglycerides: 141 mg/dL (ref 0–149)
VLDL Cholesterol Cal: 25 mg/dL (ref 5–40)

## 2020-05-18 LAB — CBC
Hematocrit: 46.6 % (ref 34.0–46.6)
Hemoglobin: 16 g/dL — ABNORMAL HIGH (ref 11.1–15.9)
MCH: 32.7 pg (ref 26.6–33.0)
MCHC: 34.3 g/dL (ref 31.5–35.7)
MCV: 95 fL (ref 79–97)
Platelets: 316 10*3/uL (ref 150–450)
RBC: 4.89 x10E6/uL (ref 3.77–5.28)
RDW: 12.8 % (ref 11.7–15.4)
WBC: 8.4 10*3/uL (ref 3.4–10.8)

## 2020-05-19 ENCOUNTER — Other Ambulatory Visit: Payer: Self-pay

## 2020-05-19 ENCOUNTER — Ambulatory Visit (INDEPENDENT_AMBULATORY_CARE_PROVIDER_SITE_OTHER): Payer: BC Managed Care – PPO | Admitting: Cardiology

## 2020-05-19 ENCOUNTER — Encounter: Payer: Self-pay | Admitting: Cardiology

## 2020-05-19 VITALS — BP 118/76 | HR 86 | Ht 64.0 in | Wt 201.0 lb

## 2020-05-19 DIAGNOSIS — I25119 Atherosclerotic heart disease of native coronary artery with unspecified angina pectoris: Secondary | ICD-10-CM

## 2020-05-19 DIAGNOSIS — I10 Essential (primary) hypertension: Secondary | ICD-10-CM

## 2020-05-19 DIAGNOSIS — E782 Mixed hyperlipidemia: Secondary | ICD-10-CM | POA: Diagnosis not present

## 2020-05-19 MED ORDER — NITROGLYCERIN 0.4 MG SL SUBL: 0.4000 mg | SUBLINGUAL_TABLET | SUBLINGUAL | 3 refills | Status: DC | PRN

## 2020-05-19 NOTE — Patient Instructions (Signed)
Medication Instructions:  °Your physician recommends that you continue on your current medications as directed. Please refer to the Current Medication list given to you today. ° °*If you need a refill on your cardiac medications before your next appointment, please call your pharmacy* ° ° °Lab Work: °None today °If you have labs (blood work) drawn today and your tests are completely normal, you will receive your results only by: °• MyChart Message (if you have MyChart) OR °• A paper copy in the mail °If you have any lab test that is abnormal or we need to change your treatment, we will call you to review the results. ° ° °Testing/Procedures: °None today ° ° °Follow-Up: °At [facility], you and your health needs are our priority.  As part of our continuing mission to provide you with exceptional heart care, we have created designated Provider Care Teams.  These Care Teams include your primary Cardiologist (physician) and Advanced Practice Providers (APPs -  Physician Assistants and Nurse Practitioners) who all work together to provide you with the care you need, when you need it. ° °We recommend signing up for the patient portal called "MyChart".  Sign up information is provided on this After Visit Summary.  MyChart is used to connect with patients for Virtual Visits (Telemedicine).  Patients are able to view lab/test results, encounter notes, upcoming appointments, etc.  Non-urgent messages can be sent to your provider as well.   °To learn more about what you can do with MyChart, go to [email or web address].   ° °Your next appointment:   °6 month(s) ° °The format for your next appointment:   °In Person ° °Provider:   °Samuel McDowell, MD ° ° °Other Instructions °None ° ° ° ° °Thank you for choosing [facility] ! ° ° ° ° ° ° ° ° °

## 2020-05-19 NOTE — Addendum Note (Signed)
Addended by: Marlyn Corporal A on: 05/19/2020 01:22 PM   Modules accepted: Orders

## 2020-05-19 NOTE — Progress Notes (Signed)
Cardiology Office Note  Date: 05/19/2020   ID: Stacy Douglas, DOB [birth year], MRN [id number]  PCP:  Stacy Pierini, FNP  Cardiologist:  Stacy Dell, MD Electrophysiologist:  None   Chief Complaint  Patient presents with  . Cardiac follow-up    History of Present Illness: Stacy Douglas is a 70 y.o. female last assessed via telehealth encounter in July 2021.  She presents for a routine visit.  Reports occasional "twinges" in her chest, no pattern and atypical for angina.  She has not had to use any nitroglycerin.  Recent LDL 131.  She has a history of statin intolerance and also intolerance of Zetia.  We plan to check on coverage for either Praluent or Repatha.  Today we discussed a walking plan for exercise.  She admits that she has not been as active over the last year.  We went over her medications which are outlined below.  No spontaneous bleeding problems on dual antiplatelet therapy.  I personally reviewed her ECG today which shows normal sinus rhythm with low voltage.  Past Medical History:  Diagnosis Date  . Coronary atherosclerosis of native coronary artery    a. VF arrest in [place] s/p BMS LAD 5/11, PTCA circumflex 6/11, LVEF 40%. b. [place] 10/2014 s/p DES to prox and mid RCA, otherwise patent LAD stent and moderate disease in the Cx, LVEF 55-65%. c. low-risk NST in 06/2017 d. 09/2017: NSTEMI with cath showing 99% mid-LCx stenosis --> treated with DES; patent stents along LAD and RCA.   Stacy Douglas (deep venous thrombosis) (HCC) 1992   LLE  . Essential hypertension   . History of duodenal ulcer 1960s  . Mixed hyperlipidemia   . Myocardial infarction (HCC) 08/2009   complicated by VF arrest  . NSTEMI (non-ST elevated myocardial infarction) (HCC) 10/07/2017   Stacy Douglas 10/07/2017  . Obesity   . Plavix resistance   . Tobacco abuse     Past Surgical History:  Procedure Laterality Date  . APPENDECTOMY    . BREAST BIOPSY Right   . CARDIAC CATHETERIZATION N/A  11/16/2014   Procedure: Left Heart Cath and Coronary Angiography;  Surgeon: Iran Ouch, MD;  [facility];  Service: Cardiovascular;  Laterality: N/A;  . CARDIAC CATHETERIZATION N/A 11/16/2014   Procedure: Coronary Stent Intervention;  Surgeon: Iran Ouch, MD;  Location: [facility] INVASIVE CV LAB;  Service: Cardiovascular;  Laterality: N/A;  . CATARACT EXTRACTION W/ INTRAOCULAR LENS IMPLANT Right 1990s  . CORONARY ANGIOPLASTY WITH STENT PLACEMENT  2011  . CORONARY ANGIOPLASTY WITH STENT PLACEMENT  10/07/2017  . CORONARY STENT INTERVENTION N/A 10/07/2017   Procedure: CORONARY STENT INTERVENTION;  Surgeon: Swaziland, Peter M, MD;  Location: [facility] INVASIVE CV LAB;  Service: Cardiovascular;  Laterality: N/A;  . LEFT HEART CATH AND CORONARY ANGIOGRAPHY N/A 10/07/2017   Procedure: LEFT HEART CATH AND CORONARY ANGIOGRAPHY;  Surgeon: Swaziland, Peter M, MD;  Location: [facility] INVASIVE CV LAB;  Service: Cardiovascular;  Laterality: N/A;  . MASS EXCISION  1980s   "tooks lumps off my neck; front and back"    Current Outpatient Medications  Medication Sig Dispense Refill  . aspirin EC 81 MG tablet Take 81 mg by mouth daily.    . metoprolol succinate (TOPROL XL) 25 MG 24 hr tablet Take 1 tablet (25 mg total) by mouth daily. 90 tablet 3  . nitroGLYCERIN (NITROSTAT) 0.4 MG SL tablet Place 1 tablet (0.4 mg total) under the tongue every 5 (five) minutes x 3 doses as  needed. 25 tablet 3  . prasugrel (EFFIENT) 10 MG TABS tablet Take 1 tablet by mouth once daily 90 tablet 3   No current facility-administered medications for this visit.   Allergies:  Crestor [rosuvastatin calcium], Plavix [clopidogrel bisulfate], and Codeine   ROS: No palpitations or syncope.  Physical Exam: VS:  BP 118/76   Pulse 86   Ht 5\' 4"  (1.626 m)   Wt 201 lb (91.2 kg)   SpO2 100%   BMI 34.50 kg/m , BMI Body mass index is 34.5 kg/m.  Wt Readings from Last 3 Encounters:  05/19/20 201 lb (91.2 kg)  11/26/19 195 lb (88.5  kg)  01/19/19 212 lb (96.2 kg)    General: Patient appears comfortable at rest. HEENT: Conjunctiva and lids normal, wearing a mask. Neck: Supple, no elevated JVP or carotid bruits, no thyromegaly. Lungs: Clear to auscultation, nonlabored breathing at rest. Cardiac: Regular rate and rhythm, no S3 or significant systolic murmur, no pericardial rub. Extremities: No pitting edema.  ECG:  An ECG dated 01/19/2019 was personally reviewed today and demonstrated:  Normal sinus rhythm with low voltage.  Recent Labwork: 05/17/2020: ALT 9; AST 16; BUN 11; Creatinine, Ser 0.90; Hemoglobin 16.0; Platelets 316; Potassium 4.4; Sodium 141     Component Value Date/Time   CHOL 198 05/17/2020 1314   TRIG 141 05/17/2020 1314   TRIG 118 11/02/2013 1428   HDL 42 05/17/2020 1314   HDL 41 11/02/2013 1428   CHOLHDL 4.7 (H) 05/17/2020 1314   CHOLHDL 3.3 12/18/2017 1202   VLDL 23 10/08/2017 0510   LDLCALC 131 (H) 05/17/2020 1314   LDLCALC 85 12/18/2017 1202   LDLCALC 103 (H) 11/02/2013 1428    Other Studies Reviewed Today:  Cardiac catheterization and PCI 10/07/2017:  Prox RCA to Mid RCA lesion is 10% stenosed.  Dist RCA lesion is 50% stenosed.  Acute Mrg lesion is 75% stenosed.  Prox LAD to Mid LAD lesion is 10% stenosed.  Mid LAD lesion is 30% stenosed.  Prox Cx-1 lesion is 20% stenosed.  Prox Cx-2 lesion is 99% stenosed.  Post intervention, there is a 0% residual stenosis.  A drug-eluting stent was successfully placed using a STENT SYNERGY DES 2.5X12.  The left ventricular systolic function is normal.  LV end diastolic pressure is normal.  The left ventricular ejection fraction is 50-55% by visual estimate.  1. Single vessel obstructive CAD- 99% mid LCx 2. Prior stents in LAD and RCA are patent. 3. Low normal LV function. EF 50-55% with inferobasal HK. 4. Normal LVEDP 5. Successful stenting of the mid LCx with DES  Assessment and Plan:  1. Multivessel CAD status post BMS to the  LAD, DES to the proximal and mid RCA, and angioplasty of the circumflex with subsequent DES to the circumflex last in June 2019.  She does not report any clear-cut angina symptoms on current medications.  Continue aspirin, Toprol-XL, and Effient.  Refill provided for fresh bottle of nitroglycerin.  2. Mixed hyperlipidemia with statin intolerance and also intolerance of Zetia.  Checking on potential coverage for PCSK9 inhibitors.  3. Essential hypertension, blood pressure is well controlled today.  No changes were made.  Medication Adjustments/Labs and Tests Ordered: Current medicines are reviewed at length with the patient today.  Concerns regarding medicines are outlined above.   Tests Ordered: Orders Placed This Encounter  Procedures  . EKG 12-Lead    Medication Changes: No orders of the defined types were placed in this encounter.   Disposition:  Follow  up 6 months in the [place] office.  Signed, Jonelle Sidle, MD, [facility] 05/19/2020 1:16 PM    [facility] [address] [place], [place] [address] Phone: [phone]; Fax: [phone]

## 2020-05-22 ENCOUNTER — Telehealth: Payer: Self-pay

## 2020-05-22 MED ORDER — REPATHA SURECLICK 140 MG/ML ~~LOC~~ SOAJ: 140.0000 mg | SUBCUTANEOUS | 11 refills | Status: DC

## 2020-05-22 NOTE — Telephone Encounter (Signed)
Called and lmom pt that they were approved for repatha and rx sent and instructed the pt to call back if costly

## 2020-05-23 ENCOUNTER — Other Ambulatory Visit: Payer: Self-pay | Admitting: Pharmacist

## 2020-05-23 MED ORDER — REPATHA SURECLICK 140 MG/ML ~~LOC~~ SOAJ: 140.0000 mg | SUBCUTANEOUS | 11 refills | Status: DC

## 2020-05-24 NOTE — Telephone Encounter (Signed)
Patient called, cannot afford $500 copay.  Sent $5 copay to her via mail.

## 2020-12-19 DIAGNOSIS — M7989 Other specified soft tissue disorders: Secondary | ICD-10-CM | POA: Diagnosis not present

## 2020-12-19 DIAGNOSIS — M47816 Spondylosis without myelopathy or radiculopathy, lumbar region: Secondary | ICD-10-CM | POA: Diagnosis not present

## 2020-12-19 DIAGNOSIS — W01198A Fall on same level from slipping, tripping and stumbling with subsequent striking against other object, initial encounter: Secondary | ICD-10-CM | POA: Diagnosis not present

## 2020-12-19 DIAGNOSIS — S76011A Strain of muscle, fascia and tendon of right hip, initial encounter: Secondary | ICD-10-CM | POA: Diagnosis not present

## 2020-12-19 DIAGNOSIS — S59912A Unspecified injury of left forearm, initial encounter: Secondary | ICD-10-CM | POA: Diagnosis not present

## 2020-12-19 DIAGNOSIS — Z885 Allergy status to narcotic agent status: Secondary | ICD-10-CM | POA: Diagnosis not present

## 2020-12-19 DIAGNOSIS — M1611 Unilateral primary osteoarthritis, right hip: Secondary | ICD-10-CM | POA: Diagnosis not present

## 2020-12-19 DIAGNOSIS — S5012XA Contusion of left forearm, initial encounter: Secondary | ICD-10-CM | POA: Diagnosis not present

## 2020-12-19 DIAGNOSIS — S73101A Unspecified sprain of right hip, initial encounter: Secondary | ICD-10-CM | POA: Diagnosis not present

## 2020-12-19 DIAGNOSIS — M799 Soft tissue disorder, unspecified: Secondary | ICD-10-CM | POA: Diagnosis not present

## 2021-01-21 ENCOUNTER — Other Ambulatory Visit: Payer: Self-pay | Admitting: Cardiology

## 2021-04-27 ENCOUNTER — Other Ambulatory Visit: Payer: Self-pay

## 2021-04-27 DIAGNOSIS — E785 Hyperlipidemia, unspecified: Secondary | ICD-10-CM

## 2021-04-27 DIAGNOSIS — F17200 Nicotine dependence, unspecified, uncomplicated: Secondary | ICD-10-CM

## 2021-05-10 ENCOUNTER — Telehealth: Payer: Self-pay | Admitting: Cardiology

## 2021-05-10 MED ORDER — PRASUGREL HCL 10 MG PO TABS
10.0000 mg | ORAL_TABLET | Freq: Every day | ORAL | 0 refills | Status: DC
Start: 2021-05-10 — End: 2021-07-26

## 2021-05-10 NOTE — Telephone Encounter (Signed)
°*  STAT* If patient is at the pharmacy, call can be transferred to refill team.   1. Which medications need to be refilled? (please list name of each medication and dose if known) prasugrel (EFFIENT) 10 MG TABS tablet  2. Which pharmacy/location (including street and city if local pharmacy) is medication to be sent to?[facility] [address] [place], [place], [place] [address] [phone]  3. Do they need a 30 day or 90 day supply? 90 ds

## 2021-05-10 NOTE — Telephone Encounter (Signed)
Pt was supposed to return 09/2020, needs apt, will message front office

## 2021-06-29 LAB — HEPATIC FUNCTION PANEL
ALT: 12 IU/L (ref 0–32)
AST: 16 IU/L (ref 0–40)
Albumin: 4.6 g/dL (ref 3.8–4.8)
Alkaline Phosphatase: 83 IU/L (ref 44–121)
Bilirubin Total: 0.3 mg/dL (ref 0.0–1.2)
Bilirubin, Direct: <0.1 mg/dL (ref 0.00–0.40)
Total Protein: 6.7 g/dL (ref 6.0–8.5)

## 2021-06-29 LAB — LIPID PANEL
Chol/HDL Ratio: 4.5 ratio — ABNORMAL HIGH (ref 0.0–4.4)
Cholesterol, Total: 208 mg/dL — ABNORMAL HIGH (ref 100–199)
HDL: 46 mg/dL (ref >39)
LDL Chol Calc (NIH): 137 mg/dL — ABNORMAL HIGH (ref 0–99)
Triglycerides: 142 mg/dL (ref 0–149)
VLDL Cholesterol Cal: 25 mg/dL (ref 5–40)

## 2021-07-02 ENCOUNTER — Telehealth: Payer: Self-pay | Admitting: Cardiology

## 2021-07-02 NOTE — Telephone Encounter (Signed)
° °  Pt is returning call to get lab result °

## 2021-07-02 NOTE — Telephone Encounter (Signed)
I spoke with patient, she never started the [place]. ? ? ? ?  See 05/20/20 office note. Patient never received $5 co-pay card. She has moved and believes that may have been an issue as she lives 3 hours away now. Her current listed address in Epic is correct. ? ? ? ?I will message PharmD also. ?

## 2021-07-03 NOTE — Telephone Encounter (Signed)
From the notes it looks like Belenda Cruise emailed her a copay card on 05/24/20.  ?Patient can also call [phone] to get a copay card.  ?Once she gets the information (ID, BIN, PCN, group) she will give that information to her pharmacy. ?

## 2021-07-05 NOTE — Telephone Encounter (Signed)
I spoke with patient,she asked I send her info via MyChart ?

## 2021-07-11 NOTE — Progress Notes (Signed)
? ? ?Cardiology Office Note ? ?Date: 07/12/2021  ? ?ID: Stacy Douglas, DOB 06/29/1950, MRN [id number] ? ?PCP:  Bennie Pierini, FNP  ?Cardiologist:  Nona Dell, MD ?Electrophysiologist:  None  ? ?Chief Complaint  ?Patient presents with  ? Cardiac follow-up  ? ? ?History of Present Illness: ?Stacy Douglas is a 71 y.o. female last seen in January 2022.  She is here for a follow-up visit.  Reports no angina symptoms or nitroglycerin use.  [place] is [place] from [place].  There is a track near her apartment on which she has been doing some walking. ? ?I reviewed her medications which are noted below.  She has statin myalgias, attempted initiation of Repatha however she is undergoing process of establishing medication insurance and lipid clinic is working on other options for getting Repatha covered. Recent lipid panel showed LDL 137. ? ?I personally reviewed her ECG today which shows normal sinus rhythm. ? ?Past Medical History:  ?Diagnosis Date  ? Coronary atherosclerosis of native coronary artery   ? a. VF arrest in [place] s/p BMS LAD 5/11, PTCA circumflex 6/11, LVEF 40%. b. [place] 10/2014 s/p DES to prox and mid RCA, otherwise patent LAD stent and moderate disease in the Cx, LVEF 55-65%. c. low-risk NST in 06/2017 d. 09/2017: NSTEMI with cath showing 99% mid-LCx stenosis --> treated with DES; patent stents along LAD and RCA.   ? DVT (deep venous thrombosis) (HCC) 1992  ? LLE  ? Essential hypertension   ? History of duodenal ulcer 1960s  ? Mixed hyperlipidemia   ? Myocardial infarction (HCC) 08/2009  ? complicated by VF arrest  ? NSTEMI (non-ST elevated myocardial infarction) (HCC) 10/07/2017  ? [name] 10/07/2017  ? Obesity   ? Plavix resistance   ? Tobacco abuse   ? ? ?Past Surgical History:  ?Procedure Laterality Date  ? APPENDECTOMY    ? BREAST BIOPSY Right   ? CARDIAC CATHETERIZATION N/A 11/16/2014  ? Procedure: Left Heart Cath and Coronary Angiography;   Surgeon: Iran Ouch, MD;  Location: [facility] INV[facility] CV LAB;  Service: Cardiovascular;  Laterality: N/A;  ? CARDIAC CATHETERIZATION N/A 11/16/2014  ? Procedure: Coronary Stent Intervention;  Surgeon: Iran Ouch, MD;  Location: [facility] INVASIVE CV LAB;  Service: Cardiovascular;  Laterality: N/A;  ? CATARACT EXTRACTION W/ INTRAOCULAR LENS IMPLANT Right 1990s  ? CORONARY ANGIOPLASTY WITH STENT PLACEMENT  2011  ? CORONARY ANGIOPLASTY WITH STENT PLACEMENT  10/07/2017  ? CORONARY STENT INTERVENTION N/A 10/07/2017  ? Procedure: CORONARY STENT INTERVENTION;  Surgeon: Swaziland, Peter M, MD;  Location[facility] INVASIVE CV LAB;  Service: Cardiovascular;  Laterality: N/A;  ? LEFT HEART CATH AND CORONARY ANGIOGRAPHY N/A 10/07/2017  ? Procedure: LEFT HEART CATH AND CORONARY ANGIOGRAPHY;  Surgeon: Swaziland, Peter M, MD;  Location: [facility] INVASIVE CV LAB;  Service: Cardiovascular;  Laterality: N/A;  ? MASS EXCISION  1980s  ? "tooks lumps off my neck; front and back"  ? ? ?Current Outpatient Medications  ?Medication Sig Dispense Refill  ? aspirin EC 81 MG tablet Take 81 mg by mouth daily.    ? metoprolol succinate (TOPROL-XL) 25 MG 24 hr tablet Take 1 tablet by mouth once daily 90 tablet 0  ? nitroGLYCERIN (NITROSTAT) 0.4 MG SL tablet Place 1 tablet (0.4 mg total) under the tongue every 5 (five) minutes x 3 doses as needed. 25 tablet 3  ? prasugrel (EFFIENT) 10 MG TABS tablet Take 1 tablet (10  mg total) by mouth daily. 90 tablet 0  ? Evolocumab (REPATHA SURECLICK) 140 MG/ML SOAJ Inject 140 mg into the skin every 14 (fourteen) days. (Patient not taking: Reported on 07/12/2021) 2 mL 11  ? ?No current facility-administered medications for this visit.  ? ?Allergies:  Crestor [rosuvastatin calcium], Plavix [clopidogrel bisulfate], and Codeine  ? ?ROS: No palpitations or syncope. ? ?Physical Exam: ?VS:  BP 115/64   Pulse 84   Ht 5\' 4"  (1.626 m)   Wt 208 lb 6.4 oz (94.5 kg)   SpO2 98%   BMI 35.77 kg/m? , BMI Body mass index is 35.77 kg/m?. ? ?Wt  Readings from Last 3 Encounters:  ?07/12/21 208 lb 6.4 oz (94.5 kg)  ?05/19/20 201 lb (91.2 kg)  ?11/26/19 195 lb (88.5 kg)  ?  ?General: Patient appears comfortable at rest. ?HEENT: Conjunctiva and lids normal, wearing a mask. ?Neck: Supple, no elevated JVP or carotid bruits, no thyromegaly. ?Lungs: Clear to auscultation, nonlabored breathing at rest. ?Cardiac: Regular rate and rhythm, no S3 or significant systolic murmur, no pericardial rub. ?Extremities: No pitting edema. ? ?ECG:  An ECG dated 05/19/2020 was personally reviewed today and demonstrated:  Sinus rhythm with low voltage. ? ?Recent Labwork: ?06/28/2021: ALT 12; AST 16  ?   ?Component Value Date/Time  ? CHOL 208 (H) 06/28/2021 0945  ? TRIG 142 06/28/2021 0945  ? TRIG 118 11/02/2013 1428  ? HDL 46 06/28/2021 0945  ? HDL 41 11/02/2013 1428  ? CHOLHDL 4.5 (H) 06/28/2021 0945  ? CHOLHDL 3.3 12/18/2017 1202  ? VLDL 23 10/08/2017 0510  ? LDLCALC 137 (H) 06/28/2021 0945  ? LDLCALC 85 12/18/2017 1202  ? LDLCALC 103 (H) 11/02/2013 1428  ? ? ?Other Studies Reviewed Today: ? ?Cardiac catheterization and PCI 10/07/2017: ?Prox RCA to Mid RCA lesion is 10% stenosed. ?Dist RCA lesion is 50% stenosed. ?Acute Mrg lesion is 75% stenosed. ?Prox LAD to Mid LAD lesion is 10% stenosed. ?Mid LAD lesion is 30% stenosed. ?Prox Cx-1 lesion is 20% stenosed. ?Prox Cx-2 lesion is 99% stenosed. ?Post intervention, there is a 0% residual stenosis. ?A drug-eluting stent was successfully placed using a STENT SYNERGY DES 2.5X12. ?The left ventricular systolic function is normal. ?LV end diastolic pressure is normal. ?The left ventricular ejection fraction is 50-55% by visual estimate. ?  ?1. Single vessel obstructive CAD- 99% mid LCx ?2. Prior stents in LAD and RCA are patent. ?3. Low normal LV function. EF 50-55% with inferobasal HK. ?4. Normal LVEDP ?5. Successful stenting of the mid LCx with DES ? ?Assessment and Plan: ? ?1.  Multivessel CAD status post BMS to the LAD, DES to the  proximal and mid RCA, and angioplasty of the circumflex with subsequent DES to the circumflex most recently in 2019.  She reports no active angina on medical therapy.  Continuing DAPT in the form of aspirin and Effient, also on Toprol XL and trying to initiate Repatha.  She has as needed nitroglycerin available.  ECG is normal today. ? ?2.  Mixed hyperlipidemia with statin myalgias.  Working on getting coverage for Repatha through the lipid clinic.  Her last LDL was 137. ? ?3.  Central hypertension, blood pressure is well controlled today. ? ?Medication Adjustments/Labs and Tests Ordered: ?Current medicines are reviewed at length with the patient today.  Concerns regarding medicines are outlined above.  ? ?Tests Ordered: ?No orders of the defined types were placed in this encounter. ? ? ?Medication Changes: ?No orders of the defined types were  placed in this encounter. ? ? ?Disposition:  Follow up  1 year. ? ?Signed, ?Jonelle Sidle, MD, [facility] ?07/12/2021 8:41 AM    ?[facility] at [facility] ?[address] [place], [place], [place] [address] ?Phone: [phone]; Fax: [phone]  ?

## 2021-07-12 ENCOUNTER — Ambulatory Visit: Payer: Self-pay | Admitting: Cardiology

## 2021-07-12 ENCOUNTER — Encounter: Payer: Self-pay | Admitting: Cardiology

## 2021-07-12 VITALS — BP 115/64 | HR 84 | Ht 64.0 in | Wt 208.4 lb

## 2021-07-12 DIAGNOSIS — I10 Essential (primary) hypertension: Secondary | ICD-10-CM

## 2021-07-12 DIAGNOSIS — M791 Myalgia, unspecified site: Secondary | ICD-10-CM

## 2021-07-12 DIAGNOSIS — I25119 Atherosclerotic heart disease of native coronary artery with unspecified angina pectoris: Secondary | ICD-10-CM

## 2021-07-12 DIAGNOSIS — E782 Mixed hyperlipidemia: Secondary | ICD-10-CM

## 2021-07-12 DIAGNOSIS — T466X5A Adverse effect of antihyperlipidemic and antiarteriosclerotic drugs, initial encounter: Secondary | ICD-10-CM

## 2021-07-12 NOTE — Addendum Note (Signed)
Addended by: Lesle Chris on: 07/12/2021 08:52 AM ? ? Modules accepted: Orders ? ?

## 2021-07-12 NOTE — Patient Instructions (Signed)
Medication Instructions:  ?Your physician recommends that you continue on your current medications as directed. Please refer to the Current Medication list given to you today.  ? ?Labwork: ?none ? ?Testing/Procedures: ?none ? ?Follow-Up: ?Your physician recommends that you schedule a follow-up appointment in: 1 year ? ?Any Other Special Instructions Will Be Listed Below (If Applicable). ? ?You will receive a reminder call in about 10 months reminding you schedule your appointment. If you don't receive this call, please contact our office. ? ?If you need a refill on your cardiac medications before your next appointment, please call your pharmacy. ? ?

## 2021-07-26 ENCOUNTER — Telehealth: Payer: Self-pay | Admitting: Cardiology

## 2021-07-26 ENCOUNTER — Other Ambulatory Visit: Payer: Self-pay | Admitting: Cardiology

## 2021-07-26 NOTE — Telephone Encounter (Signed)
?*  STAT* If patient is at the pharmacy, call can be transferred to refill team. ? ? ?1. Which medications need to be refilled? (please list name of each medication and dose if known)  ?metoprolol succinate (TOPROL-XL) 25 MG 24 hr tablet ?prasugrel (EFFIENT) 10 MG TABS tablet ? ?2. Which pharmacy/location (including street and city if local pharmacy) is medication to be sent to? ?[facility] [address] [place] - [phone] [place] LOOP ? ?3. Do they need a 30 day or 90 day supply?  ?90 day supply ?

## 2021-07-26 NOTE — Telephone Encounter (Signed)
Refill complete 

## 2022-04-17 ENCOUNTER — Encounter: Payer: Self-pay | Admitting: *Deleted

## 2022-04-17 NOTE — Progress Notes (Signed)
Stacy Douglas (Key: [id number]) Repatha SureClick 140MG /ML auto-injectors   Form [employer] [place] Form (CB) Determination Favorable 2 days ago Message from Plan Effective from 04/15/2022 through 04/14/2023.

## 2022-05-10 ENCOUNTER — Other Ambulatory Visit (HOSPITAL_COMMUNITY): Payer: Self-pay

## 2022-07-19 ENCOUNTER — Other Ambulatory Visit: Payer: Self-pay | Admitting: Cardiology

## 2022-10-14 ENCOUNTER — Other Ambulatory Visit: Payer: Self-pay | Admitting: Cardiology

## 2022-11-18 ENCOUNTER — Telehealth: Payer: Self-pay | Admitting: Cardiology

## 2022-11-18 NOTE — Telephone Encounter (Signed)
Calling to see if her appt can be virtual on 7/24 because she put in the wrong date to be off from work. Please advise

## 2022-11-18 NOTE — Telephone Encounter (Signed)
  Patient Consent for Virtual Visit        Stacy Douglas has provided verbal consent on 11/18/2022 for a virtual visit (video or telephone).   CONSENT FOR VIRTUAL VISIT FOR:  Stacy Douglas  By participating in this virtual visit I agree to the following:  I hereby voluntarily request, consent and authorize [facility] and its employed or contracted physicians, physician assistants, nurse practitioners or other licensed health care professionals (the Practitioner), to provide me with telemedicine health care services (the "Services") as deemed necessary by the treating Practitioner. I acknowledge and consent to receive the Services by the Practitioner via telemedicine. I understand that the telemedicine visit will involve communicating with the Practitioner through live audiovisual communication technology and the disclosure of certain medical information by electronic transmission. I acknowledge that I have been given the opportunity to request an in-person assessment or other available alternative prior to the telemedicine visit and am voluntarily participating in the telemedicine visit.  I understand that I have the right to withhold or withdraw my consent to the use of telemedicine in the course of my care at any time, without affecting my right to future care or treatment, and that the Practitioner or I may terminate the telemedicine visit at any time. I understand that I have the right to inspect all information obtained and/or recorded in the course of the telemedicine visit and may receive copies of available information for a reasonable fee.  I understand that some of the potential risks of receiving the Services via telemedicine include:  Delay or interruption in medical evaluation due to technological equipment failure or disruption; Information transmitted may not be sufficient (e.g. poor resolution of images) to allow for appropriate medical decision making by the Practitioner;  and/or  In rare instances, security protocols could fail, causing a breach of personal health information.  Furthermore, I acknowledge that it is my responsibility to provide information about my medical history, conditions and care that is complete and accurate to the best of my ability. I acknowledge that Practitioner's advice, recommendations, and/or decision may be based on factors not within their control, such as incomplete or inaccurate data provided by me or distortions of diagnostic images or specimens that may result from electronic transmissions. I understand that the practice of medicine is not an exact science and that Practitioner makes no warranties or guarantees regarding treatment outcomes. I acknowledge that a copy of this consent can be made available to me via my patient portal [facility] MyChart), or I can request a printed copy by calling the office of [facility].    I understand that my insurance will be billed for this visit.   I have read or had this consent read to me. I understand the contents of this consent, which adequately explains the benefits and risks of the Services being provided via telemedicine.  I have been provided ample opportunity to ask questions regarding this consent and the Services and have had my questions answered to my satisfaction. I give my informed consent for the services to be provided through the use of telemedicine in my medical care   FYI.  Contacted patient regarding recall appointment, patient notified our office they did not wish to keep this appointment at this time.  Deleted recall from system.

## 2022-11-20 ENCOUNTER — Encounter: Payer: Self-pay | Admitting: Cardiology

## 2022-11-20 ENCOUNTER — Ambulatory Visit: Payer: BC Managed Care – PPO | Attending: Cardiology | Admitting: Cardiology

## 2022-11-20 VITALS — BP 124/73 | HR 85 | Ht 64.0 in | Wt 190.0 lb

## 2022-11-20 DIAGNOSIS — E782 Mixed hyperlipidemia: Secondary | ICD-10-CM

## 2022-11-20 DIAGNOSIS — I25119 Atherosclerotic heart disease of native coronary artery with unspecified angina pectoris: Secondary | ICD-10-CM

## 2022-11-20 DIAGNOSIS — I10 Essential (primary) hypertension: Secondary | ICD-10-CM | POA: Diagnosis not present

## 2022-11-20 DIAGNOSIS — Z79899 Other long term (current) drug therapy: Secondary | ICD-10-CM | POA: Diagnosis not present

## 2022-11-20 MED ORDER — TICAGRELOR 60 MG PO TABS: 60.0000 mg | ORAL_TABLET | Freq: Two times a day (BID) | ORAL | 6 refills | Status: DC

## 2022-11-20 NOTE — Patient Instructions (Addendum)
Medication Instructions:  Your physician has recommended you make the following change in your medication:  Stop effient Start brilinta 60 mg twice daily-coupon mailed to home address Continue all other medications as presribed  Labwork: Your physician recommends that you return for a FASTING lipid profile: as soon as possible. Please do not eat or drink for at least 8 hours when you have this done. You may take your medications that morning with a sip of water. Your lab order has been mailed to you.  Testing/Procedures: none  Follow-Up: Your physician recommends that you schedule a follow-up appointment in: 6 months  Any Other Special Instructions Will Be Listed Below (If Applicable).  If you need a refill on your cardiac medications before your next appointment, please call your pharmacy.

## 2022-11-20 NOTE — Progress Notes (Signed)
Virtual Visit via Telephone Note   Because of Stacy Douglas's co-morbid illnesses, she is at least at moderate risk for complications without adequate follow up.  This format is felt to be most appropriate for this patient at this time.  The patient did not have access to video technology/had technical difficulties with video requiring transitioning to audio format only (telephone).  All issues noted in this document were discussed and addressed.  No physical exam could be performed with this format.  Please refer to the patient's chart for her consent to telehealth for [facility]    Date:  11/20/2022   ID:  Stacy Douglas, DOB 1950-10-13, MRN [id number] The patient was identified using 2 identifiers.  Patient Location: Other:  Home Provider Location: Office/Clinic  Evaluation Performed:  Follow-Up Visit  History of Present Illness:    Stacy Douglas is a 72 y.o. female last seen in March 2023.  We spoke by phone today.  She is now living in [place] just outside [place].  Working for [employer] in [place] remotely.  She will be establishing with a local PCP in her area sometime in September.  She does not report any active angina at this time on medical therapy.  We went over her medications, she continues on aspirin, Toprol-XL, Effient, and as needed nitroglycerin.  She was previously found to be a nonresponder to Plavix.  She has not had any bleeding problems on DAPT which we have continued longer-term in light of multiple stent interventions.  We did discuss switching her to Brilinta from Effient however.  She had also previously been on Repatha with history of statin myalgias but lost her insurance and this medication was discontinued.  LDL in March 2023 was 137.  Labs/Other Tests and Data Reviewed:    EKG:  An ECG dated 07/12/2021 was personally reviewed today and demonstrated:  Sinus rhythm.  Recent Labs:  March 2023: Cholesterol 208, triglycerides 142, HDL 46,  LDL 137, AST 16, ALT 12  Wt Readings from Last 3 Encounters:  11/20/22 190 lb (86.2 kg)  07/12/21 208 lb 6.4 oz (94.5 kg)  05/19/20 201 lb (91.2 kg)        Objective:    Vital Signs:  BP 124/73   Pulse 85   Ht 5\' 4"  (1.626 m)   Wt 190 lb (86.2 kg)   BMI 32.61 kg/m    Patient was not examined today.  ASSESSMENT & PLAN:    1.  CAD status post BMS to the LAD and PTCA of the circumflex in 2011, DES to the proximal and mid RCA in 2016, and DES to the mid circumflex in 2019.  LVEF greater than 65% as of 2019.  No active angina at this time or nitroglycerin use.  Continue aspirin, switched from Effient to Brilinta 60 mg twice daily, continue Toprol-XL.  2.  Essential hypertension.  Blood pressure reported today is within normal limits.  3.  Mixed hyperlipidemia.  She has history of statin myalgias.  Last LDL was 137 in March 2023.  She had previously been on Repatha, however discontinued when she lost her health insurance.  Now that things are back on track we will get a follow-up lipid panel and see if we can get her back on Repatha.  Goal LDL 55 or less with vascular disease.   Time:   Today, I have spent 8 minutes with the patient with telehealth technology discussing the above problems.  Medication Adjustments/Labs and Tests Ordered: Current medicines are reviewed at length with the patient today.  Concerns regarding medicines are outlined above.    Follow Up:  In Person  6 months.  Signed, Nona Dell, MD  11/20/2022 4:40 PM    [facility]

## 2023-01-11 ENCOUNTER — Other Ambulatory Visit: Payer: Self-pay | Admitting: Cardiology

## 2023-03-15 ENCOUNTER — Other Ambulatory Visit: Payer: Self-pay | Admitting: Cardiology

## 2023-03-18 ENCOUNTER — Other Ambulatory Visit: Payer: Self-pay | Admitting: Cardiology

## 2023-03-19 ENCOUNTER — Telehealth: Payer: Self-pay | Admitting: Cardiology

## 2023-03-19 MED ORDER — PRASUGREL HCL 10 MG PO TABS: 10.0000 mg | ORAL_TABLET | Freq: Every day | ORAL | 1 refills | Status: DC

## 2023-03-19 NOTE — Telephone Encounter (Signed)
Per Dr. Diona Browner: We can switch back to Effient.   Patient will call once she finishes moving to make an appointment. Will stop Brilinta and start back on Effient 60 mg.

## 2023-03-19 NOTE — Telephone Encounter (Signed)
*  STAT* If patient is at the pharmacy, call can be transferred to refill team.   1. Which medications need to be refilled? (please list name of each medication and dose if known)   prasugrel (EFFIENT) tablet 60 mg     2. Would you like to learn more about the convenience, safety, & potential cost savings by using the [facility]?   3. Are you open to using the [facility] (Type [facility]. ).  4. Which pharmacy/location (including street and city if local pharmacy) is medication to be sent to?  [facility] [address]  [address]   5. Do they need a 30 day or 90 day supply?  90 day  Patient stated she is completely out of this medication.

## 2023-04-07 ENCOUNTER — Telehealth: Payer: Self-pay | Admitting: Cardiology

## 2023-04-07 MED ORDER — PRASUGREL HCL 10 MG PO TABS: 10.0000 mg | ORAL_TABLET | Freq: Every day | ORAL | 1 refills | Status: DC

## 2023-04-07 NOTE — Telephone Encounter (Signed)
Refill sent.

## 2023-04-07 NOTE — Telephone Encounter (Signed)
 *  STAT* If patient is at the pharmacy, call can be transferred to refill team.   1. Which medications need to be refilled? (please list name of each medication and dose if known)   prasugrel (EFFIENT) 10 MG TABS tablet   2. Which pharmacy/location (including street and city if local pharmacy) is medication to be sent to?  [facility] [place]  3. Do they need a 30 day or 90 day supply?  30 day

## 2023-04-09 ENCOUNTER — Other Ambulatory Visit: Payer: Self-pay | Admitting: Cardiology

## 2023-05-19 ENCOUNTER — Telehealth: Payer: Self-pay | Admitting: Cardiology

## 2023-05-19 ENCOUNTER — Other Ambulatory Visit: Payer: Self-pay | Admitting: Cardiology

## 2023-05-19 MED ORDER — METOPROLOL SUCCINATE ER 25 MG PO TB24: 25.0000 mg | ORAL_TABLET | Freq: Every day | ORAL | 0 refills | Status: DC

## 2023-05-19 NOTE — Telephone Encounter (Signed)
*  STAT* If patient is at the pharmacy, call can be transferred to refill team.   1. Which medications need to be refilled? (please list name of each medication and dose if known) metoprolol succinate (TOPROL-XL) 25 MG 24 hr tablet   4. Which pharmacy/location (including street and city if local pharmacy) is medication to be sent to? [facility] [address] [place] [address] Phone: [phone]  Fax: [phone]       5. Do they need a 30 day or 90 day supply? 90

## 2023-05-19 NOTE — Telephone Encounter (Signed)
Filled until up coming appointment.

## 2023-05-21 ENCOUNTER — Emergency Department (HOSPITAL_COMMUNITY)
Admission: EM | Admit: 2023-05-21 | Discharge: 2023-05-21 | Disposition: A | Discharge status: Home / Self Care | Payer: BC Managed Care – PPO | Attending: Emergency Medicine | Admitting: Emergency Medicine

## 2023-05-21 ENCOUNTER — Emergency Department (HOSPITAL_COMMUNITY): Payer: BC Managed Care – PPO

## 2023-05-21 ENCOUNTER — Telehealth: Payer: Self-pay | Admitting: Cardiology

## 2023-05-21 ENCOUNTER — Other Ambulatory Visit: Payer: Self-pay

## 2023-05-21 ENCOUNTER — Encounter (HOSPITAL_COMMUNITY): Payer: Self-pay

## 2023-05-21 DIAGNOSIS — I10 Essential (primary) hypertension: Secondary | ICD-10-CM | POA: Diagnosis not present

## 2023-05-21 DIAGNOSIS — M549 Dorsalgia, unspecified: Secondary | ICD-10-CM | POA: Diagnosis not present

## 2023-05-21 DIAGNOSIS — Z79899 Other long term (current) drug therapy: Secondary | ICD-10-CM | POA: Insufficient documentation

## 2023-05-21 DIAGNOSIS — R079 Chest pain, unspecified: Secondary | ICD-10-CM | POA: Diagnosis not present

## 2023-05-21 DIAGNOSIS — Z7982 Long term (current) use of aspirin: Secondary | ICD-10-CM | POA: Insufficient documentation

## 2023-05-21 DIAGNOSIS — K449 Diaphragmatic hernia without obstruction or gangrene: Secondary | ICD-10-CM | POA: Diagnosis not present

## 2023-05-21 DIAGNOSIS — I2510 Atherosclerotic heart disease of native coronary artery without angina pectoris: Secondary | ICD-10-CM | POA: Insufficient documentation

## 2023-05-21 DIAGNOSIS — R0789 Other chest pain: Secondary | ICD-10-CM | POA: Diagnosis not present

## 2023-05-21 LAB — CBC WITH DIFFERENTIAL/PLATELET
Abs Immature Granulocytes: 0.02 10*3/uL (ref 0.00–0.07)
Basophils Absolute: 0.1 10*3/uL (ref 0.0–0.1)
Basophils Relative: 1 %
Eosinophils Absolute: 0.2 10*3/uL (ref 0.0–0.5)
Eosinophils Relative: 3 %
HCT: 45.9 % (ref 36.0–46.0)
Hemoglobin: 15.7 g/dL — ABNORMAL HIGH (ref 12.0–15.0)
Immature Granulocytes: 0 %
Lymphocytes Relative: 30 %
Lymphs Abs: 2.2 10*3/uL (ref 0.7–4.0)
MCH: 33.2 pg (ref 26.0–34.0)
MCHC: 34.2 g/dL (ref 30.0–36.0)
MCV: 97 fL (ref 80.0–100.0)
Monocytes Absolute: 0.6 10*3/uL (ref 0.1–1.0)
Monocytes Relative: 8 %
Neutro Abs: 4.2 10*3/uL (ref 1.7–7.7)
Neutrophils Relative %: 58 %
Platelets: 295 10*3/uL (ref 150–400)
RBC: 4.73 MIL/uL (ref 3.87–5.11)
RDW: 13.2 % (ref 11.5–15.5)
WBC: 7.2 10*3/uL (ref 4.0–10.5)
nRBC: 0 % (ref 0.0–0.2)

## 2023-05-21 LAB — COMPREHENSIVE METABOLIC PANEL
ALT: 15 U/L (ref 0–44)
AST: 18 U/L (ref 15–41)
Albumin: 4.2 g/dL (ref 3.5–5.0)
Alkaline Phosphatase: 72 U/L (ref 38–126)
Anion gap: 9 (ref 5–15)
BUN: 18 mg/dL (ref 8–23)
CO2: 26 mmol/L (ref 22–32)
Calcium: 9.2 mg/dL (ref 8.9–10.3)
Chloride: 104 mmol/L (ref 98–111)
Creatinine, Ser: 0.99 mg/dL (ref 0.44–1.00)
GFR, Estimated: >60 mL/min (ref >60)
Glucose, Bld: 111 mg/dL — ABNORMAL HIGH (ref 70–99)
Potassium: 4.1 mmol/L (ref 3.5–5.1)
Sodium: 139 mmol/L (ref 135–145)
Total Bilirubin: 0.2 mg/dL (ref 0.0–1.2)
Total Protein: 7.2 g/dL (ref 6.5–8.1)

## 2023-05-21 LAB — MAGNESIUM: Magnesium: 2.2 mg/dL (ref 1.7–2.4)

## 2023-05-21 LAB — LIPASE, BLOOD: Lipase: 30 U/L (ref 11–51)

## 2023-05-21 LAB — TROPONIN I (HIGH SENSITIVITY)
Troponin I (High Sensitivity): 2 ng/L (ref <18)
Troponin I (High Sensitivity): <2 ng/L (ref <18)

## 2023-05-21 MED ORDER — IOHEXOL 350 MG/ML SOLN
75.0000 mL | Freq: Once | INTRAVENOUS | Status: AC | PRN
Start: 2023-05-21 — End: 2023-05-21
  Administered 2023-05-21: 75 mL via INTRAVENOUS

## 2023-05-21 MED ORDER — OXYCODONE-ACETAMINOPHEN 5-325 MG PO TABS
1.0000 | ORAL_TABLET | Freq: Once | ORAL | Status: AC
  Administered 2023-05-21: 1 via ORAL
  Filled 2023-05-21: qty 1

## 2023-05-21 MED ORDER — NITROGLYCERIN 0.4 MG SL SUBL: 0.4000 mg | SUBLINGUAL_TABLET | SUBLINGUAL | 2 refills | Status: AC | PRN

## 2023-05-21 MED ORDER — NITROGLYCERIN 2 % TD OINT
1.0000 [in_us] | TOPICAL_OINTMENT | Freq: Once | TRANSDERMAL | Status: AC
Start: 2023-05-21 — End: 2023-05-21
  Administered 2023-05-21: 1 [in_us] via TOPICAL
  Filled 2023-05-21: qty 1

## 2023-05-21 MED ORDER — ASPIRIN 81 MG PO CHEW
243.0000 mg | CHEWABLE_TABLET | Freq: Once | ORAL | Status: AC
Start: 2023-05-21 — End: 2023-05-21
  Administered 2023-05-21: 243 mg via ORAL
  Filled 2023-05-21: qty 3

## 2023-05-21 NOTE — Discharge Instructions (Signed)
Your test results today are reassuring.  I spoke with Dr. Diona Browner today who agrees with keeping your follow-up appointment for further evaluation.  In the meantime, if you do have any return of concerning symptoms, do not hesitate to return to the ED.  Ensure that you are eating and drinking normally and taking your time going from sitting to standing to minimize symptoms of lightheadedness.  A new prescription for nitroglycerin was sent to your pharmacy.

## 2023-05-21 NOTE — ED Notes (Signed)
ED Provider at bedside. 

## 2023-05-21 NOTE — ED Notes (Signed)
X-ray at bedside

## 2023-05-21 NOTE — ED Triage Notes (Signed)
Pt arrived via POV form home c/o Chest Pain radiating to her back. 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 Pt reports her NTG tablets have expired, so she did not take any PTA. Pt reports she take Metoprolol and ASA PTA. Pt endorses nausea.

## 2023-05-21 NOTE — Telephone Encounter (Signed)
   Patient called this morning to discuss symptoms of dizziness and right back/shoulder blade pain. She has felt dizzy/woozy since Monday of this week. Today her BP is 102/68mmHg. Earlier this week, diastolic BP in the 90s. She tells me that her back pain feels identical to the symptoms she had with prior MI. Given this similarity in symptoms, I advised patient to proceed to the nearest ED. She agrees with this plan, will have her son drive her there.  Perlie Gold, PA-C

## 2023-05-21 NOTE — ED Provider Notes (Signed)
[place] EMERGENCY DEPARTMENT AT [facility] Provider Note   CSN: [id number] Arrival date & time: 05/21/23  4098     History  Chief Complaint  Patient presents with   Chest Pain    Stacy Douglas is a 73 y.o. female.  HPI Patient presents for chest pain.  Medical history includes CAD, HLD, HTN, DVT.  Her cardiologist is Dr. Diona Browner.  She has had multiple stents.  Most recent cath was 2019.  This showed multivessel disease.  She had stenting of mid left circumflex at that time.  3 days ago, she had some lightheadedness.  When checking her blood pressure at home, she found it to be low in the range of 100 SBP.  Starting last night, she had right-sided chest discomfort which radiated to posterior right shoulder.  This is pain that is reminiscent of prior MI.  Pain persisted this morning.  She had some transient nausea this morning.  She denies any other recent associated symptoms.  Current pain is 6/10 in severity.    Home Medications Prior to Admission medications   Medication Sig Start Date End Date Taking? Authorizing Provider  aspirin EC 81 MG tablet Take 81 mg by mouth daily.    [provider]  metoprolol succinate (TOPROL-XL) 25 MG 24 hr tablet Take 1 tablet (25 mg total) by mouth daily. 05/19/23   Jonelle Sidle, MD  nitroGLYCERIN (NITROSTAT) 0.4 MG SL tablet Place 1 tablet (0.4 mg total) under the tongue every 5 (five) minutes x 3 doses as needed for chest pain (if no relief after 2nd dose, proceed to ED or call 911). 05/21/23   Gloris Manchester, MD  prasugrel (EFFIENT) 10 MG TABS tablet Take 1 tablet (10 mg total) by mouth daily. 04/07/23   Jonelle Sidle, MD      Allergies    Crestor [rosuvastatin calcium], Plavix [clopidogrel bisulfate], and Codeine    Review of Systems   Review of Systems  Cardiovascular:  Positive for chest pain.  Gastrointestinal:  Positive for nausea.  Neurological:  Positive for light-headedness.  All other systems reviewed and  are negative.   Physical Exam Updated Vital Signs BP 118/76   Pulse 73   Temp 98 F (36.7 C) (Oral)   Resp 12   Ht 5\' 4"  (1.626 m)   Wt 87 kg   SpO2 96%   BMI 32.92 kg/m  Physical Exam Vitals and nursing note reviewed.  Constitutional:      General: She is not in acute distress.    Appearance: She is well-developed. She is not ill-appearing, toxic-appearing or diaphoretic.  HENT:     Head: Normocephalic and atraumatic.  Eyes:     Conjunctiva/sclera: Conjunctivae normal.  Cardiovascular:     Rate and Rhythm: Normal rate and regular rhythm.     Heart sounds: No murmur heard. Pulmonary:     Effort: Pulmonary effort is normal. No tachypnea or respiratory distress.     Breath sounds: Normal breath sounds. No decreased breath sounds, wheezing, rhonchi or rales.  Chest:     Chest wall: No tenderness.  Abdominal:     Palpations: Abdomen is soft.     Tenderness: There is no abdominal tenderness.  Musculoskeletal:        General: No swelling. Normal range of motion.     Cervical back: Normal range of motion and neck supple.     Right lower leg: No edema.     Left lower leg: No edema.  Skin:    General: Skin is warm and dry.  Neurological:     General: No focal deficit present.     Mental Status: She is alert and oriented to person, place, and time.  Psychiatric:        Mood and Affect: Mood normal.        Behavior: Behavior normal.     ED Results / Procedures / Treatments   Labs (all labs ordered are listed, but only abnormal results are displayed) Labs Reviewed  COMPREHENSIVE METABOLIC PANEL - Abnormal; Notable for the following components:      Result Value   Glucose, Bld 111 (*)    All other components within normal limits  CBC WITH DIFFERENTIAL/PLATELET - Abnormal; Notable for the following components:   Hemoglobin 15.7 (*)    All other components within normal limits  MAGNESIUM  LIPASE, BLOOD  TROPONIN I (HIGH SENSITIVITY)  TROPONIN I (HIGH SENSITIVITY)     EKG EKG Interpretation Date/Time:  Wednesday May 21 2023 09:18:50 EST Ventricular Rate:  76 PR Interval:  155 QRS Duration:  100 QT Interval:  364 QTC Calculation: 410 R Axis:   47  Text Interpretation: Sinus rhythm Low voltage, precordial leads Confirmed by Gloris Manchester ([id number]) on 05/21/2023 9:27:43 AM  Radiology CT Angio Chest PE W and/or Wo Contrast Result Date: 05/21/2023 CLINICAL DATA:  Pulmonary embolism (PE) suspected, high prob. Chest pain radiating to the back. EXAM: CT ANGIOGRAPHY CHEST WITH CONTRAST TECHNIQUE: Multidetector CT imaging of the chest was performed using the standard protocol during bolus administration of intravenous contrast. Multiplanar CT image reconstructions and MIPs were obtained to evaluate the vascular anatomy. RADIATION DOSE REDUCTION: This exam was performed according to the departmental dose-optimization program which includes automated exposure control, adjustment of the mA and/or kV according to patient size and/or use of iterative reconstruction technique. CONTRAST:  75mL OMNIPAQUE IOHEXOL 350 MG/ML SOLN COMPARISON:  None Available. FINDINGS: Cardiovascular: No evidence of embolism to the proximal subsegmental pulmonary artery level. Normal cardiac size. No pericardial effusion. No aortic aneurysm. There are coronary artery calcifications, in keeping with coronary artery disease. There are also minimal peripheral atherosclerotic vascular calcifications of thoracic aorta and its major branches. Mediastinum/Nodes: Visualized thyroid gland appears grossly unremarkable. No solid / cystic mediastinal masses. The esophagus is nondistended precluding optimal assessment. No axillary, mediastinal or hilar lymphadenopathy by size criteria. Lungs/Pleura: The central tracheo-bronchial tree is patent. There are dependent changes in bilateral lungs. No mass or consolidation. No pleural effusion or pneumothorax. No suspicious lung nodules. Upper Abdomen: There is a small  sliding hiatal hernia. Visualized upper abdominal viscera within normal limits. Musculoskeletal: The visualized soft tissues of the chest wall are grossly unremarkable. No suspicious osseous lesions. There are mild multilevel degenerative changes in the visualized spine. Review of the MIP images confirms the above findings. IMPRESSION: 1. No embolism to the proximal subsegmental pulmonary artery level. 2. No lung mass, consolidation, pleural effusion or pneumothorax. Electronically Signed   By: Jules Schick M.D.   On: 05/21/2023 11:44   DG Chest Portable 1 View Result Date: 05/21/2023 CLINICAL DATA:  Chest pain EXAM: PORTABLE CHEST 1 VIEW COMPARISON:  X-ray 10/08/2017 FINDINGS: No consolidation, pneumothorax or effusion. No edema. Normal cardiopericardial silhouette. Overlapping cardiac leads. Stable interstitial prominence. Likely chronic. Degenerative changes along the spine IMPRESSION: No acute cardiopulmonary disease. Electronically Signed   By: Karen Kays M.D.   On: 05/21/2023 10:06    Procedures Procedures    Medications Ordered in ED Medications  aspirin chewable tablet 243 mg (243 mg Oral Given 05/21/23 0940)  nitroGLYCERIN (NITROGLYN) 2 % ointment 1 inch (1 inch Topical Given 05/21/23 0944)  oxyCODONE-acetaminophen (PERCOCET/ROXICET) 5-325 MG per tablet 1 tablet (1 tablet Oral Given 05/21/23 0940)  iohexol (OMNIPAQUE) 350 MG/ML injection 75 mL (75 mLs Intravenous Contrast Given 05/21/23 1122)    ED Course/ Medical Decision Making/ A&P                                 Medical Decision Making Amount and/or Complexity of Data Reviewed Labs: ordered. Radiology: ordered.  Risk OTC drugs. Prescription drug management.   This patient presents to the ED for concern of chest pain, this involves an extensive number of treatment options, and is a complaint that carries with it a high risk of complications and morbidity.  The differential diagnosis includes ACS, pneumonia, PE, pleural  effusion, pericarditis   Co morbidities that complicate the patient evaluation  CAD, HLD, HTN, DVT   Additional history obtained:  Additional history obtained from N/A External records from outside source obtained and reviewed including EMR   Lab Tests:  I Ordered, and personally interpreted labs.  The pertinent results include: Normal kidney function, normal electrolytes, normal troponins x 2   Imaging Studies ordered:  I ordered imaging studies including chest x-ray, CTA chest I independently visualized and interpreted imaging which showed no acute findings I agree with the radiologist interpretation   Cardiac Monitoring: / EKG:  The patient was maintained on a cardiac monitor.  I personally viewed and interpreted the cardiac monitored which showed an underlying rhythm of: Sinus rhythm   Consultations Obtained:  I requested consultation with the cardiologist, Dr. Diona Browner,  and discussed lab and imaging findings as well as pertinent plan - they recommend: Outpatient follow-up   Problem List / ED Course / Critical interventions / Medication management  Patient with history of multiple MIs, presenting for right-sided chest pain with radiation to posterior right shoulder, reminiscent of prior ACS.  On arrival, vital signs are normal.  Patient is well-appearing on exam.  EKG does not show STEMI.  She did take one 81 mg aspirin this morning in addition to her home dose of metoprolol.  Will give to 43 mg ASA.  NTG and Percocet were ordered for her ongoing chest discomfort.  Workup was initiated.  Patient's lab work is reassuring.  She underwent CTA of chest which did not show any acute findings.  Repeat troponin was also normal.  Patient had improved symptoms while in the ED.  She was informed of her reassuring workup results.  She is here with her kids who are worried about her.  They were worried about the lightheadedness that she has been feeling.  Patient seems less worried when  they do.  She states that she simply takes her time going from sitting to standing.  I did stand her up while in the ED.  She did not have a blood pressure decrease with standing.  She is currently on low-dose blood pressure medication.  Fortunately, she does have a appointment already scheduled with Dr. Diona Browner 1 week from now.  I did speak with Dr. Diona Browner on the phone today who agrees with outpatient follow-up.  Patient was prescribed new NTG tablets given that her old ones are expired.  She was advised to return at any time for any recurrence of concerning symptoms.  She was discharged in stable condition.  I ordered medication including ASA, Percocet, NTG for chest pain Reevaluation of the patient after these medicines showed that the patient improved I have reviewed the patients home medicines and have made adjustments as needed   Social Determinants of Health:  Has access to outpatient care, has close cardiology follow-up scheduled        Final Clinical Impression(s) / ED Diagnoses Final diagnoses:  Chest pain, unspecified type    Rx / DC Orders ED Discharge Orders          Ordered    nitroGLYCERIN (NITROSTAT) 0.4 MG SL tablet  Every 5 min x3 PRN        05/21/23 1248              Gloris Manchester, MD 05/21/23 1315

## 2023-05-28 ENCOUNTER — Ambulatory Visit: Payer: BC Managed Care – PPO | Attending: Cardiology | Admitting: Cardiology

## 2023-05-28 ENCOUNTER — Other Ambulatory Visit: Payer: Self-pay | Admitting: Cardiology

## 2023-05-28 ENCOUNTER — Encounter: Payer: Self-pay | Admitting: Cardiology

## 2023-05-28 VITALS — BP 112/82 | HR 71 | Ht 64.0 in | Wt 207.0 lb

## 2023-05-28 DIAGNOSIS — E782 Mixed hyperlipidemia: Secondary | ICD-10-CM

## 2023-05-28 DIAGNOSIS — I25119 Atherosclerotic heart disease of native coronary artery with unspecified angina pectoris: Secondary | ICD-10-CM

## 2023-05-28 DIAGNOSIS — I10 Essential (primary) hypertension: Secondary | ICD-10-CM | POA: Diagnosis not present

## 2023-05-28 DIAGNOSIS — T466X5D Adverse effect of antihyperlipidemic and antiarteriosclerotic drugs, subsequent encounter: Secondary | ICD-10-CM

## 2023-05-28 DIAGNOSIS — M791 Myalgia, unspecified site: Secondary | ICD-10-CM | POA: Diagnosis not present

## 2023-05-28 DIAGNOSIS — I200 Unstable angina: Secondary | ICD-10-CM

## 2023-05-28 DIAGNOSIS — T466X5A Adverse effect of antihyperlipidemic and antiarteriosclerotic drugs, initial encounter: Secondary | ICD-10-CM

## 2023-05-28 MED ORDER — PRASUGREL HCL 10 MG PO TABS: 10.0000 mg | ORAL_TABLET | Freq: Every day | ORAL | 1 refills | Status: DC

## 2023-05-28 MED ORDER — METOPROLOL SUCCINATE ER 25 MG PO TB24: 25.0000 mg | ORAL_TABLET | Freq: Every day | ORAL | 0 refills | Status: DC

## 2023-05-28 NOTE — Patient Instructions (Addendum)
Medication Instructions:  Your physician recommends that you continue on your current medications as directed. Please refer to the Current Medication list given to you today.   Labwork: Fasting Lipid Panel to be completed at [facility]  Testing/Procedures:  [place] [facility] DEPT [facility] [address] [place] [address] Dept: [phone] Loc: [phone]  Stacy Douglas  05/28/2023  You are scheduled for a Cardiac Catheterization on Friday, January 31 with Dr.  Rosemary Holms .  1. Please arrive [facility] at [facility]: [address] [place], [place] [address] at 11:30 AM (This time is 2 hour(s) before your procedure to ensure your preparation).   Free valet parking service is available. You will check in at ADMITTING. The support person will be asked to wait in the waiting room.  It is OK to have someone drop you off and come back when you are ready to be discharged.    Special note: Every effort is made to have your procedure done on time. Please understand that emergencies sometimes delay scheduled procedures.  2. Diet: Do not eat solid foods after midnight.  The patient may have clear liquids until 5am upon the day of the procedure.  3. Medication instructions in preparation for your procedure:   Contrast Allergy: No    On the morning of your procedure, take your Aspirin 81 mg and any morning medicines NOT listed above.  You may use sips of water.  5. Plan to go home the same day, you will only stay overnight if medically necessary. 6. Bring a current list of your medications and current insurance cards. 7. You MUST have a responsible person to drive you home. 8. Someone MUST be with you the first 24 hours after you arrive home or your discharge will be delayed. 9. Please wear clothes that are easy to get on and off and wear slip-on shoes.  Thank you for allowing [place] to  care for you!   -- [facility]   Follow-Up: Your physician recommends that you schedule a follow-up appointment in: 1 month after [facility]  Any Other Special Instructions Will Be Listed Below (If Applicable). Referral placed to Pharm D  Thank you for choosing [facility]!     If you need a refill on your cardiac medications before your next appointment, please call your pharmacy.

## 2023-05-28 NOTE — Progress Notes (Signed)
Cardiology Office Note  Date: 05/28/2023   ID: Stacy, Douglas 1951-03-08, MRN [id number]  History of Present Illness: Stacy Douglas is a 73 y.o. female last assessed via telehealth encounter in July 2024.  She was living in [place] at that time, now back locally in [place].  I reviewed the chart, she was seen in the ER at [facility] recently for evaluation of chest discomfort.  ECG showed no acute ST segment changes and high-sensitivity troponin I levels were normal.  Chest CTA obtained per ER staff showed no pulmonary embolus or other acute findings.  We discussed her symptoms.  She states that a few days prior to ER encounter she started to experience pain in her shoulder blade for wanting her prior angina, also a feeling of dizziness and weakness.  Had one low blood pressure measured at home (98/50).  States that she was compliant with her medications.  Since ER discharge she does not report any recurrent shoulder blade discomfort, but has not returned to normal activity.  I reviewed her medications.  Current regimen includes aspirin, Toprol-XL, Effient, and as needed nitroglycerin.  We had discussed possibly switching her from Effient to Brilinta at last encounter, however her insurance denied coverage for this.  She has a history of statin myalgias with both Crestor and Lipitor.  We did discuss Repatha previously, however she never got started on therapy in the process of insurance changes and moving.  Physical Exam: VS:  BP 112/82   Pulse 71   Ht 5\' 4"  (1.626 m)   Wt 207 lb (93.9 kg)   SpO2 99%   BMI 35.53 kg/m , BMI Body mass index is 35.53 kg/m.  Wt Readings from Last 3 Encounters:  05/28/23 207 lb (93.9 kg)  05/21/23 191 lb 12.8 oz (87 kg)  11/20/22 190 lb (86.2 kg)    General: Patient appears comfortable at rest. HEENT: Conjunctiva and lids normal. Neck: Supple, no elevated JVP or carotid bruits. Lungs: Clear to auscultation, nonlabored breathing at  rest. Cardiac: Regular rate and rhythm, no S3 or significant systolic murmur. Abdomen: Soft, nontender, bowel sounds present. Extremities: No pitting edema.  ECG:  An ECG dated 05/21/2023 was personally reviewed today and demonstrated:  Sinus rhythm with borderline low voltage.  Labwork: 05/21/2023: ALT 15; AST 18; BUN 18; Creatinine, Ser 0.99; Hemoglobin 15.7; Magnesium 2.2; Platelets 295; Potassium 4.1; Sodium 139   Other Studies Reviewed Today:  No interval cardiac testing for review today.  Assessment and Plan:  1.  CAD status post BMS to the LAD and PTCA of the circumflex in 2011, DES to the proximal and mid RCA in 2016, and DES to the mid circumflex in 2019.  LVEF greater than 65% as of 2019.  She presents reporting recent symptoms concerning for accelerating angina although did not have evidence of ACS at recent ER encounter.  We have discussed diagnostic options including risk and benefits, plan is to proceed with a cardiac catheterization, she is in agreement.  For now continue aspirin, Effient, Toprol-XL, and as needed nitroglycerin.   2.  Primary hypertension.  Blood pressure is normal today.   3.  Mixed hyperlipidemia.  She has history of statin myalgias with both Crestor and Lipitor.  Last LDL was 137 in March 2023.  Plan to update FLP and get her referred for a virtual encounter with the lipid clinic, needs to be approved for PCSK9 inhibitors and initiate treatment.  Disposition:  Follow up  after procedure.  Signed, Jonelle Sidle, M.D., F.A.C.C. [facility]

## 2023-05-29 ENCOUNTER — Telehealth: Payer: Self-pay | Admitting: *Deleted

## 2023-05-29 NOTE — Telephone Encounter (Signed)
Cardiac Catheterization scheduled at [facility] for: Friday May 30, 2023 1:30 PM Arrival time [facility] Main Entrance A at: 11:30 AM  Nothing to eat after midnight prior to procedure, clear liquids until 5 AM day of procedure.  Medication instructions: -Usual morning medications can be taken with sips of water including aspirin 81 mg and Effient 10 mg.  Plan to go home the same day, you will only stay overnight if medically necessary.  You must have responsible adult to drive you home.  Someone must be with you the first 24 hours after you arrive home.  Reviewed procedure instructions with patient.

## 2023-05-30 ENCOUNTER — Ambulatory Visit: Payer: BC Managed Care – PPO | Admitting: Cardiology

## 2023-05-30 ENCOUNTER — Ambulatory Visit (HOSPITAL_COMMUNITY)
Admission: RE | Admit: 2023-05-30 | Discharge: 2023-05-31 | Disposition: A | Discharge status: Home / Self Care | Payer: BC Managed Care – PPO | Attending: Cardiology | Admitting: Cardiology

## 2023-05-30 ENCOUNTER — Encounter (HOSPITAL_COMMUNITY)
Admission: RE | Disposition: A | Discharge status: Home / Self Care | Payer: Self-pay | Source: Home / Self Care | Attending: Cardiology

## 2023-05-30 ENCOUNTER — Other Ambulatory Visit: Payer: Self-pay

## 2023-05-30 DIAGNOSIS — Z7902 Long term (current) use of antithrombotics/antiplatelets: Secondary | ICD-10-CM | POA: Diagnosis not present

## 2023-05-30 DIAGNOSIS — I10 Essential (primary) hypertension: Secondary | ICD-10-CM | POA: Diagnosis not present

## 2023-05-30 DIAGNOSIS — Z7982 Long term (current) use of aspirin: Secondary | ICD-10-CM | POA: Insufficient documentation

## 2023-05-30 DIAGNOSIS — Z955 Presence of coronary angioplasty implant and graft: Secondary | ICD-10-CM | POA: Diagnosis not present

## 2023-05-30 DIAGNOSIS — E782 Mixed hyperlipidemia: Secondary | ICD-10-CM | POA: Diagnosis not present

## 2023-05-30 DIAGNOSIS — Z79899 Other long term (current) drug therapy: Secondary | ICD-10-CM | POA: Diagnosis not present

## 2023-05-30 DIAGNOSIS — I2510 Atherosclerotic heart disease of native coronary artery without angina pectoris: Secondary | ICD-10-CM

## 2023-05-30 DIAGNOSIS — I25118 Atherosclerotic heart disease of native coronary artery with other forms of angina pectoris: Secondary | ICD-10-CM | POA: Diagnosis not present

## 2023-05-30 DIAGNOSIS — I200 Unstable angina: Secondary | ICD-10-CM

## 2023-05-30 DIAGNOSIS — Z9861 Coronary angioplasty status: Secondary | ICD-10-CM

## 2023-05-30 HISTORY — PX: LEFT HEART CATH AND CORONARY ANGIOGRAPHY: CATH118249

## 2023-05-30 HISTORY — PX: CORONARY PRESSURE/FFR STUDY: CATH118243

## 2023-05-30 LAB — CBC
HCT: 39.4 % (ref 36.0–46.0)
Hemoglobin: 13.5 g/dL (ref 12.0–15.0)
MCH: 33 pg (ref 26.0–34.0)
MCHC: 34.3 g/dL (ref 30.0–36.0)
MCV: 96.3 fL (ref 80.0–100.0)
Platelets: 294 10*3/uL (ref 150–400)
RBC: 4.09 MIL/uL (ref 3.87–5.11)
RDW: 13.3 % (ref 11.5–15.5)
WBC: 11.2 10*3/uL — ABNORMAL HIGH (ref 4.0–10.5)
nRBC: 0 % (ref 0.0–0.2)

## 2023-05-30 LAB — CREATININE, SERUM
Creatinine, Ser: 1.05 mg/dL — ABNORMAL HIGH (ref 0.44–1.00)
GFR, Estimated: 57 mL/min — ABNORMAL LOW (ref >60)

## 2023-05-30 LAB — POCT ACTIVATED CLOTTING TIME: Activated Clotting Time: 302 s

## 2023-05-30 SURGERY — LEFT HEART CATH AND CORONARY ANGIOGRAPHY
Anesthesia: LOCAL

## 2023-05-30 MED ORDER — ONDANSETRON HCL 4 MG/2ML IJ SOLN: 4.0000 mg | Freq: Four times a day (QID) | INTRAMUSCULAR | Status: DC | PRN

## 2023-05-30 MED ORDER — IOHEXOL 350 MG/ML SOLN
INTRAVENOUS | Status: DC | PRN
  Administered 2023-05-30: 75 mL

## 2023-05-30 MED ORDER — MIDAZOLAM HCL 2 MG/2ML IJ SOLN
INTRAMUSCULAR | Status: DC | PRN
  Administered 2023-05-30 (×3): 1 mg via INTRAVENOUS

## 2023-05-30 MED ORDER — SODIUM CHLORIDE 0.9 % WEIGHT BASED INFUSION: 3.0000 mL/kg/h | INTRAVENOUS | Status: AC

## 2023-05-30 MED ORDER — HEPARIN (PORCINE) IN NACL 1000-0.9 UT/500ML-% IV SOLN
INTRAVENOUS | Status: DC | PRN
  Administered 2023-05-30: 1500 mL

## 2023-05-30 MED ORDER — VERAPAMIL HCL 2.5 MG/ML IV SOLN
INTRAVENOUS | Status: AC
  Filled 2023-05-30: qty 2

## 2023-05-30 MED ORDER — FENTANYL CITRATE (PF) 100 MCG/2ML IJ SOLN
INTRAMUSCULAR | Status: DC | PRN
  Administered 2023-05-30: 25 ug via INTRAVENOUS
  Administered 2023-05-30: 50 ug via INTRAVENOUS
  Administered 2023-05-30: 25 ug via INTRAVENOUS

## 2023-05-30 MED ORDER — SODIUM CHLORIDE 0.9 % IV SOLN: 250.0000 mL | INTRAVENOUS | Status: DC | PRN

## 2023-05-30 MED ORDER — LIDOCAINE HCL (PF) 1 % IJ SOLN
INTRAMUSCULAR | Status: DC | PRN
  Administered 2023-05-30: 2 mL

## 2023-05-30 MED ORDER — HEPARIN SODIUM (PORCINE) 1000 UNIT/ML IJ SOLN
INTRAMUSCULAR | Status: DC | PRN
  Administered 2023-05-30: 4000 [IU] via INTRAVENOUS
  Administered 2023-05-30: 3500 [IU] via INTRAVENOUS
  Administered 2023-05-30: 2000 [IU] via INTRAVENOUS

## 2023-05-30 MED ORDER — ACETAMINOPHEN 325 MG PO TABS
650.0000 mg | ORAL_TABLET | ORAL | Status: DC | PRN
Start: 2023-05-30 — End: 2023-05-31

## 2023-05-30 MED ORDER — HEPARIN SODIUM (PORCINE) 5000 UNIT/ML IJ SOLN
5000.0000 [IU] | Freq: Three times a day (TID) | INTRAMUSCULAR | Status: DC
  Administered 2023-05-30: 5000 [IU] via SUBCUTANEOUS
  Filled 2023-05-30: qty 1

## 2023-05-30 MED ORDER — SODIUM CHLORIDE 0.9% FLUSH
3.0000 mL | Freq: Two times a day (BID) | INTRAVENOUS | Status: DC
  Administered 2023-05-30 – 2023-05-31 (×2): 3 mL via INTRAVENOUS

## 2023-05-30 MED ORDER — LABETALOL HCL 5 MG/ML IV SOLN: 10.0000 mg | INTRAVENOUS | Status: AC | PRN

## 2023-05-30 MED ORDER — FENTANYL CITRATE (PF) 100 MCG/2ML IJ SOLN
INTRAMUSCULAR | Status: AC
  Filled 2023-05-30: qty 2

## 2023-05-30 MED ORDER — NITROGLYCERIN 1 MG/10 ML FOR IR/CATH LAB
INTRA_ARTERIAL | Status: DC | PRN
  Administered 2023-05-30: 200 ug

## 2023-05-30 MED ORDER — VERAPAMIL HCL 2.5 MG/ML IV SOLN
INTRAVENOUS | Status: DC | PRN
  Administered 2023-05-30 (×2): 10 mL via INTRA_ARTERIAL

## 2023-05-30 MED ORDER — VERAPAMIL HCL 2.5 MG/ML IV SOLN
INTRAVENOUS | Status: DC | PRN
  Administered 2023-05-30: 2.5 mg via INTRAVENOUS

## 2023-05-30 MED ORDER — LIDOCAINE HCL (PF) 1 % IJ SOLN
INTRAMUSCULAR | Status: AC
  Filled 2023-05-30: qty 30

## 2023-05-30 MED ORDER — HYDRALAZINE HCL 20 MG/ML IJ SOLN: 10.0000 mg | INTRAMUSCULAR | Status: AC | PRN

## 2023-05-30 MED ORDER — ASPIRIN 81 MG PO CHEW: 81.0000 mg | CHEWABLE_TABLET | ORAL | Status: DC

## 2023-05-30 MED ORDER — NITROGLYCERIN 1 MG/10 ML FOR IR/CATH LAB
INTRA_ARTERIAL | Status: AC
  Filled 2023-05-30: qty 10

## 2023-05-30 MED ORDER — MIDAZOLAM HCL 2 MG/2ML IJ SOLN
INTRAMUSCULAR | Status: AC
  Filled 2023-05-30: qty 2

## 2023-05-30 MED ORDER — SODIUM CHLORIDE 0.9 % IV SOLN: INTRAVENOUS | Status: AC

## 2023-05-30 MED ORDER — SODIUM CHLORIDE 0.9 % WEIGHT BASED INFUSION: 1.0000 mL/kg/h | INTRAVENOUS | Status: DC

## 2023-05-30 MED ORDER — HEPARIN SODIUM (PORCINE) 1000 UNIT/ML IJ SOLN
INTRAMUSCULAR | Status: AC
  Filled 2023-05-30: qty 10

## 2023-05-30 MED ORDER — SODIUM CHLORIDE 0.9% FLUSH: 3.0000 mL | INTRAVENOUS | Status: DC | PRN

## 2023-05-30 SURGICAL SUPPLY — 20 items
BALLN ~~LOC~~ EMERGE MR 2.75X15 (BALLOONS) ×1
BALLOON ~~LOC~~ EMERGE MR 2.75X15 (BALLOONS) IMPLANT
CARD KEY FFR CATHWORX (MISCELLANEOUS) IMPLANT
CATH 5FR JL3.5 JR4 ANG PIG MP (CATHETERS) IMPLANT
CATH LAUNCHER 6FR EBU 3 (CATHETERS) IMPLANT
DEVICE RAD COMP TR BAND LRG (VASCULAR PRODUCTS) IMPLANT
FFR CATHWORX KEY CARD (MISCELLANEOUS) ×1
GLIDESHEATH SLEND A-KIT 6F 22G (SHEATH) IMPLANT
GLIDESHEATH SLEND SS 6F .021 (SHEATH) IMPLANT
GUIDEWIRE INQWIRE 1.5J.035X260 (WIRE) IMPLANT
GUIDEWIRE PRESSURE X 175 (WIRE) IMPLANT
INQWIRE 1.5J .035X260CM (WIRE) ×1
KIT ENCORE 26 ADVANTAGE (KITS) IMPLANT
KIT ESSENTIALS PG (KITS) IMPLANT
KIT HEMO VALVE WATCHDOG (MISCELLANEOUS) IMPLANT
PACK CARDIAC CATHETERIZATION (CUSTOM PROCEDURE TRAY) ×2 IMPLANT
SET ATX-X65L (MISCELLANEOUS) IMPLANT
SHEATH 6FR 75 DEST SLENDER (SHEATH) IMPLANT
SHEATH PROBE COVER 6X72 (BAG) IMPLANT
WIRE HI TORQ VERSACORE-J 145CM (WIRE) IMPLANT

## 2023-05-30 NOTE — Interval H&P Note (Signed)
History and Physical Interval Note:  05/30/2023 1:49 PM  Stacy Douglas  has presented today for surgery, with the diagnosis of angina.  The various methods of treatment have been discussed with the patient and family. After consideration of risks, benefits and other options for treatment, the patient has consented to  Procedure(s): LEFT HEART CATH AND CORONARY ANGIOGRAPHY (N/A) as a surgical intervention.  The patient's history has been reviewed, patient examined, no change in status, stable for surgery.  I have reviewed the patient's chart and labs.  Questions were answered to the patient's satisfaction.     Leda Bellefeuille J Shondra Capps

## 2023-05-30 NOTE — H&P (Signed)
OV 05/28/2023 copied for documentation      Cardiology Office Note  Date: 05/30/2023   ID: Stacy, Douglas 1950/10/07, MRN [id number]  History of Present Illness: Stacy Douglas is a 73 y.o. female last assessed via telehealth encounter in July 2024.  She was living in [place] at that time, now back locally in [place].  I reviewed the chart, she was seen in the ER at [facility] recently for evaluation of chest discomfort.  ECG showed no acute ST segment changes and high-sensitivity troponin I levels were normal.  Chest CTA obtained per ER staff showed no pulmonary embolus or other acute findings.  We discussed her symptoms.  She states that a few days prior to ER encounter she started to experience pain in her shoulder blade for wanting her prior angina, also a feeling of dizziness and weakness.  Had one low blood pressure measured at home (98/50).  States that she was compliant with her medications.  Since ER discharge she does not report any recurrent shoulder blade discomfort, but has not returned to normal activity.  I reviewed her medications.  Current regimen includes aspirin, Toprol-XL, Effient, and as needed nitroglycerin.  We had discussed possibly switching her from Effient to Brilinta at last encounter, however her insurance denied coverage for this.  She has a history of statin myalgias with both Crestor and Lipitor.  We did discuss Repatha previously, however she never got started on therapy in the process of insurance changes and moving.  Physical Exam: VS:  BP (!) 143/66   Pulse 74   Temp 98 F (36.7 C) (Oral)   Resp 18   Ht 5\' 4"  (1.626 m)   Wt 91.6 kg   SpO2 100%   BMI 34.67 kg/m , BMI Body mass index is 34.67 kg/m.  Wt Readings from Last 3 Encounters:  05/30/23 91.6 kg  05/28/23 93.9 kg  05/21/23 87 kg    General: Patient appears comfortable at rest. HEENT: Conjunctiva and lids normal. Neck: Supple, no elevated JVP or carotid bruits. Lungs: Clear to  auscultation, nonlabored breathing at rest. Cardiac: Regular rate and rhythm, no S3 or significant systolic murmur. Abdomen: Soft, nontender, bowel sounds present. Extremities: No pitting edema.  ECG:  An ECG dated 05/21/2023 was personally reviewed today and demonstrated:  Sinus rhythm with borderline low voltage.  Labwork: 05/21/2023: ALT 15; AST 18; BUN 18; Creatinine, Ser 0.99; Hemoglobin 15.7; Magnesium 2.2; Platelets 295; Potassium 4.1; Sodium 139   Other Studies Reviewed Today:  No interval cardiac testing for review today.  Assessment and Plan:  1.  CAD status post BMS to the LAD and PTCA of the circumflex in 2011, DES to the proximal and mid RCA in 2016, and DES to the mid circumflex in 2019.  LVEF greater than 65% as of 2019.  She presents reporting recent symptoms concerning for accelerating angina although did not have evidence of ACS at recent ER encounter.  We have discussed diagnostic options including risk and benefits, plan is to proceed with a cardiac catheterization, she is in agreement.  For now continue aspirin, Effient, Toprol-XL, and as needed nitroglycerin.   2.  Primary hypertension.  Blood pressure is normal today.   3.  Mixed hyperlipidemia.  She has history of statin myalgias with both Crestor and Lipitor.  Last LDL was 137 in March 2023.  Plan to update FLP and get her referred for a virtual encounter with the lipid clinic, needs to be approved for PCSK9 inhibitors  and initiate treatment.  Disposition:  Follow up  after procedure.  Signed, Jonelle Sidle, M.D., F.A.C.C. [facility] at [facility]

## 2023-05-31 ENCOUNTER — Other Ambulatory Visit: Payer: Self-pay | Admitting: Home Health

## 2023-05-31 DIAGNOSIS — I10 Essential (primary) hypertension: Secondary | ICD-10-CM | POA: Diagnosis not present

## 2023-05-31 DIAGNOSIS — I200 Unstable angina: Secondary | ICD-10-CM | POA: Diagnosis not present

## 2023-05-31 DIAGNOSIS — E782 Mixed hyperlipidemia: Secondary | ICD-10-CM | POA: Diagnosis not present

## 2023-05-31 DIAGNOSIS — Z7982 Long term (current) use of aspirin: Secondary | ICD-10-CM | POA: Diagnosis not present

## 2023-05-31 DIAGNOSIS — Z9861 Coronary angioplasty status: Secondary | ICD-10-CM | POA: Diagnosis not present

## 2023-05-31 DIAGNOSIS — R079 Chest pain, unspecified: Secondary | ICD-10-CM

## 2023-05-31 DIAGNOSIS — Z7902 Long term (current) use of antithrombotics/antiplatelets: Secondary | ICD-10-CM | POA: Diagnosis not present

## 2023-05-31 DIAGNOSIS — Z955 Presence of coronary angioplasty implant and graft: Secondary | ICD-10-CM | POA: Diagnosis not present

## 2023-05-31 DIAGNOSIS — Z79899 Other long term (current) drug therapy: Secondary | ICD-10-CM | POA: Diagnosis not present

## 2023-05-31 DIAGNOSIS — I25118 Atherosclerotic heart disease of native coronary artery with other forms of angina pectoris: Secondary | ICD-10-CM | POA: Diagnosis not present

## 2023-05-31 LAB — BASIC METABOLIC PANEL
Anion gap: 11 (ref 5–15)
BUN: 13 mg/dL (ref 8–23)
CO2: 22 mmol/L (ref 22–32)
Calcium: 8.6 mg/dL — ABNORMAL LOW (ref 8.9–10.3)
Chloride: 107 mmol/L (ref 98–111)
Creatinine, Ser: 0.91 mg/dL (ref 0.44–1.00)
GFR, Estimated: >60 mL/min (ref >60)
Glucose, Bld: 102 mg/dL — ABNORMAL HIGH (ref 70–99)
Potassium: 4 mmol/L (ref 3.5–5.1)
Sodium: 140 mmol/L (ref 135–145)

## 2023-05-31 LAB — CBC
HCT: 39.6 % (ref 36.0–46.0)
Hemoglobin: 13.8 g/dL (ref 12.0–15.0)
MCH: 33.5 pg (ref 26.0–34.0)
MCHC: 34.8 g/dL (ref 30.0–36.0)
MCV: 96.1 fL (ref 80.0–100.0)
Platelets: 262 10*3/uL (ref 150–400)
RBC: 4.12 MIL/uL (ref 3.87–5.11)
RDW: 13.3 % (ref 11.5–15.5)
WBC: 7.7 10*3/uL (ref 4.0–10.5)
nRBC: 0 % (ref 0.0–0.2)

## 2023-05-31 MED ORDER — ASPIRIN 81 MG PO CHEW: 81.0000 mg | CHEWABLE_TABLET | Freq: Every day | ORAL | Status: DC

## 2023-05-31 NOTE — Discharge Summary (Signed)
Discharge Summary    Patient ID: Stacy Douglas MRN: [id number]; DOB: [birth year]  Admit date: 05/30/2023 Discharge date: 05/31/2023  PCP:  Oneita Hurt No   [facility] Providers Cardiologist:  Nona Dell, MD   {   Discharge Diagnoses    Principal Problem:   Post PTCA    Diagnostic Studies/Procedures    Left heart catheterization from 05/30/2023:  Coronary angiography 05/30/2023: LM: Normal LAD: Long proximal/mid LAD stent with 75% ISR, followed by de novo 75% focal stenosis.          IFR 0.71, which remained low at 0.76 after PTCA to ISR Ramus: Small to medium caliber vessel with long 80% disease proximally Lcx: Moderate proximal diffuse 40% disease, followed by patent stent without significant restenosis.          Following this, there is focal 75% stenosis.  iFR 0.78 RCA: Prox-mid stent with 30% ISR.  Distal 60 to 80% diffuse stenosis.   LVEDP 25 mmHg   I initially performed virtual FFR that was abnormal in LAD, but not left circumflex.  My initial thought process was to perform PTCA to the old LAD stent.  I used 6 [place] EBU 3.0 guide and wired LAD with pressure wire.  Mid LAD RFR was 0.71.  I performed postdilatation using 2.75 x 15 mm [place] balloon up to 20 atm.  Distal RCA, moderate to severe mid left circumflex.  I repeated RFR at this time.  While RFR was slightly improved, was still physiologically significant at 0.76.  At this time, also noted a de novo lesion that was outside the stent.  My goal was to not place new stents in LAD, which would take off the option of CABG.  Also, I performed invasive RFR on mid left circumflex, which was 0.78, unlike virtual FFR which erroneously called at 0.82, which would be normal.   At this point, I decided to perform no further intervention and rather discuss with CVTS if CABG would be an option.  The prior stents are to 7.5 mm or lower in diameter, and would run risk of recurrent restenosis with repeat stenting.  If CABG is  deemed not to be an option either by the patient or the surgeons, we could consider PTCA with drug-coated balloon for LAD ISR, and stenting Denovo lesions in LAD, left circumflex, +/- RCA.   Patient can continue dual antiplatelet therapy with aspirin and Effient, which can be held at any point for bypass surgery.  This would not risk new stent thrombosis.  If CABG is not an option either for patient wishes or surgical risk, could consider DCB to LAD stent, followed by de novo stenting in mid LAD and mid left circumflex.        Elder Negus, MD      _____________   History of Present Illness      Per admission H&P by Dr. Rosemary Holms on 05/30/2023:  Stacy Douglas is a 73 y.o. female last assessed via telehealth encounter in July 2024.  She was living in [place] at that time, now back locally in [place].   Dr. Rosemary Holms reviewed the chart, she was seen in the ER at [facility] recently for evaluation of chest discomfort.  ECG showed no acute ST segment changes and high-sensitivity troponin I levels were normal.  Chest CTA obtained per ER staff showed no pulmonary embolus or other acute findings.   Dr. Rosemary Holms discussed her symptoms.  She stated that a few days prior  to ER encounter she started to experience pain in her shoulder blade for wanting her prior angina, also a feeling of dizziness and weakness.  Had one low blood pressure measured at home (98/50).  Stated that she was compliant with her medications.  Since ER discharge she does not report any recurrent shoulder blade discomfort, but has not returned to normal activity.    Dr. Rosemary Holms reviewed her medications.  Current regimen includes aspirin, Toprol-XL, Effient, and as needed nitroglycerin.   Dr. Rosemary Holms had discussed possibly switching her from Effient to Brilinta at last encounter, however her insurance denied coverage for this.   She has a history of statin myalgias with both Crestor and Lipitor.   Dr. Rosemary Holms  did discuss Repatha previously, however she never got started on therapy in the process of insurance changes and moving.  Due to concerning accelerating angina, she was arranged outpatient cardiac catheterization.      Hospital Course     Consultants: n/a   CAD Accelerating angina  -Presented for outpatient cardiac catheterization due to accelerating angina -Left heart cath yesterday: Long proximal/mid LAD stent with 75% ISR, followed by de novo 75% focal stenosis. IFR 0.71, which remained low at 0.76 after PTCA to ISR. Small to medium caliber Ramus with long 80% disease proximally. Moderate proximal LCx diffuse 40% disease, followed by patent stent without significant restenosis. Following this, there is focal 75% stenosis. iFR 0.78. Prox-mid RCA stent with 30% ISR.  Distal 60 to 80% diffuse stenosis.  Balloon angio was done, no PCI target was felt appropriate/see note for detail.  She was referred for CT surgery evaluation. CT surgery had arranged outpatient consult on 06/13/23.  She was recommended continue current dual antiplatelet therapy with aspirin 81 mg and Effient 10 mg, along with PTA metoprolol XL 25 mg for medical therapy.  See cath report for detail.  -Post-cath instructions reviewed with the patient, written handout will be provided -Lab today with stable renal index creatinine 0.91 -No change of home medication, she reports having adequate supply of nitroglycerin -Follow-up with CV surgery has been arranged on 06/13/2023 -Dr. Mikal Plane is recommending an outpatient echocardiogram completed at [facility] office message sent to office to arrange, order placed  -She has existing appointment with Dr. Diona Browner on/1/25, message sent to office for earlier appointment in 2-3 weeks   Hypertension -Blood pressure is adequately controlled on metoprolol, no change  Hyperlipidemia -History of statin intolerance due to myalgia, Repatha was discussed outpatient and she has not initiated the  process, follow-up with primary cardiology         Did the patient have an acute coronary syndrome (MI, NSTEMI, STEMI, etc) this admission?:  No                               Did the patient have a percutaneous coronary intervention (stent / angioplasty)?:  No.        The patient will be scheduled for a TOC follow up appointment in 14 days.  A message has been sent to the [facility] and Scheduling Pool at the office where the patient should be seen for follow up.  _____________  Discharge Vitals Blood pressure (!) 115/49, pulse 75, temperature 98.9 F (37.2 C), temperature source Oral, resp. rate 17, height 5\' 4"  (1.626 m), weight 95.4 kg, SpO2 94%.  Filed Weights   05/30/23 1115 05/30/23 1915  Weight: 91.6 kg 95.4 kg   See attending  progress note today for physical exam   Labs & Radiologic Studies    CBC Recent Labs    05/30/23 2207 05/31/23 0338  WBC 11.2* 7.7  HGB 13.5 13.8  HCT 39.4 39.6  MCV 96.3 96.1  PLT 294 262   Basic Metabolic Panel Recent Labs    16/10/96 2207 05/31/23 0338  NA  --  140  K  --  4.0  CL  --  107  CO2  --  22  GLUCOSE  --  102*  BUN  --  13  CREATININE 1.05* 0.91  CALCIUM  --  8.6*   Liver Function Tests No results for input(s): "AST", "ALT", "ALKPHOS", "BILITOT", "PROT", "ALBUMIN" in the last 72 hours. No results for input(s): "LIPASE", "AMYLASE" in the last 72 hours. High Sensitivity Troponin:   Recent Labs  Lab 05/21/23 0948 05/21/23 1115  TROPONINIHS 2 <2    BNP Invalid input(s): "POCBNP" D-Dimer No results for input(s): "DDIMER" in the last 72 hours. Hemoglobin A1C No results for input(s): "HGBA1C" in the last 72 hours. Fasting Lipid Panel No results for input(s): "CHOL", "HDL", "LDLCALC", "TRIG", "CHOLHDL", "LDLDIRECT" in the last 72 hours. Thyroid Function Tests No results for input(s): "TSH", "T4TOTAL", "T3FREE", "THYROIDAB" in the last 72 hours.  Invalid input(s): "FREET3" _____________  CARDIAC  CATHETERIZATION Result Date: 05/30/2023 Images from the original result were not included. Coronary angiography 05/30/2023: LM: Normal LAD: Long proximal/mid LAD stent with 75% ISR, followed by de novo 75% focal stenosis.          IFR 0.71, which remained low at 0.76 after PTCA to ISR Ramus: Small to medium caliber vessel with long 80% disease proximally Lcx: Moderate proximal diffuse 40% disease, followed by patent stent without significant restenosis.         Following this, there is focal 75% stenosis.  iFR 0.78 RCA: Prox-mid stent with 30% ISR.  Distal 60 to 80% diffuse stenosis. LVEDP 25 mmHg I initially performed virtual FFR that was abnormal in LAD, but not left circumflex.  My initial thought process was to perform PTCA to the old LAD stent.  I used 6 [place] EBU 3.0 guide and wired LAD with pressure wire.  Mid LAD RFR was 0.71.  I performed postdilatation using 2.75 x 15 mm [place] balloon up to 20 atm.  Distal RCA, moderate to severe mid left circumflex.  I repeated RFR at this time.  While RFR was slightly improved, was still physiologically significant at 0.76.  At this time, also noted a de novo lesion that was outside the stent.  My goal was to not place new stents in LAD, which would take off the option of CABG.  Also, I performed invasive RFR on mid left circumflex, which was 0.78, unlike virtual FFR which erroneously called at 0.82, which would be normal. At this point, I decided to perform no further intervention and rather discuss with CVTS if CABG would be an option.  The prior stents are to 7.5 mm or lower in diameter, and would run risk of recurrent restenosis with repeat stenting.  If CABG is deemed not to be an option either by the patient or the surgeons, we could consider PTCA with drug-coated balloon for LAD ISR, and stenting Denovo lesions in LAD, left circumflex, +/- RCA. Patient can continue dual antiplatelet therapy with aspirin and Effient, which can be held at any point for bypass surgery.   This would not risk new stent thrombosis.  If CABG is not an  option either for patient wishes or surgical risk, could consider DCB to LAD stent, followed by de novo stenting in mid LAD and mid left circumflex. Elder Negus, MD   CT Angio Chest PE W and/or Wo Contrast Result Date: 05/21/2023 CLINICAL DATA:  Pulmonary embolism (PE) suspected, high prob. Chest pain radiating to the back. EXAM: CT ANGIOGRAPHY CHEST WITH CONTRAST TECHNIQUE: Multidetector CT imaging of the chest was performed using the standard protocol during bolus administration of intravenous contrast. Multiplanar CT image reconstructions and MIPs were obtained to evaluate the vascular anatomy. RADIATION DOSE REDUCTION: This exam was performed according to the departmental dose-optimization program which includes automated exposure control, adjustment of the mA and/or kV according to patient size and/or use of iterative reconstruction technique. CONTRAST:  75mL OMNIPAQUE IOHEXOL 350 MG/ML SOLN COMPARISON:  None Available. FINDINGS: Cardiovascular: No evidence of embolism to the proximal subsegmental pulmonary artery level. Normal cardiac size. No pericardial effusion. No aortic aneurysm. There are coronary artery calcifications, in keeping with coronary artery disease. There are also minimal peripheral atherosclerotic vascular calcifications of thoracic aorta and its major branches. Mediastinum/Nodes: Visualized thyroid gland appears grossly unremarkable. No solid / cystic mediastinal masses. The esophagus is nondistended precluding optimal assessment. No axillary, mediastinal or hilar lymphadenopathy by size criteria. Lungs/Pleura: The central tracheo-bronchial tree is patent. There are dependent changes in bilateral lungs. No mass or consolidation. No pleural effusion or pneumothorax. No suspicious lung nodules. Upper Abdomen: There is a small sliding hiatal hernia. Visualized upper abdominal viscera within normal limits. Musculoskeletal:  The visualized soft tissues of the chest wall are grossly unremarkable. No suspicious osseous lesions. There are mild multilevel degenerative changes in the visualized spine. Review of the MIP images confirms the above findings. IMPRESSION: 1. No embolism to the proximal subsegmental pulmonary artery level. 2. No lung mass, consolidation, pleural effusion or pneumothorax. Electronically Signed   By: Jules Schick M.D.   On: 05/21/2023 11:44   DG Chest Portable 1 View Result Date: 05/21/2023 CLINICAL DATA:  Chest pain EXAM: PORTABLE CHEST 1 VIEW COMPARISON:  X-ray 10/08/2017 FINDINGS: No consolidation, pneumothorax or effusion. No edema. Normal cardiopericardial silhouette. Overlapping cardiac leads. Stable interstitial prominence. Likely chronic. Degenerative changes along the spine IMPRESSION: No acute cardiopulmonary disease. Electronically Signed   By: Karen Kays M.D.   On: 05/21/2023 10:06   Disposition   Patient is seen by Dr. Ancil Boozer today, deemed stable for discharge to home .  There is no change of prior home medication .  Post-cath care discussed in detail via phone, she expressed frustration that she was not given her home meds this morning including metoprolol and effient, she did not want them to be ordered and states she will take them at home.  Follow-up with CT surgery arranged on 06/13/2023 .  Message has been sent to [facility] cardiology office to arrange outpatient echocardiogram as well as sooner follow-up in 2 to 3 weeks with Dr. Diona Browner . Pt is being discharged home today in good condition.    Follow-up Plans & Appointments     Follow-up Information     Corliss Skains, MD Follow up on 06/13/2023.   Specialty: Cardiothoracic Surgery Why: at 10:10am for cardiothoracic surgeon consult Contact information: [address] [place] [address] [phone]         Jonelle Sidle, MD Follow up.   Specialty: Cardiology Why: Office will reach out  to you to arrange a sooner follow-up appointment with cardiology Contact information: [phone] [place] [address] [phone]                Discharge Instructions     AMB Referral to Cardiac Rehabilitation - Phase II   Complete by: As directed    Pending CABG   Diagnosis:  Other PTCA     After initial evaluation and assessments completed: Virtual Based Care may be provided alone or in conjunction with Phase 2 Cardiac Rehab based on patient barriers.: Yes   Intensive Cardiac Rehabilitation (ICR) [facility] location only OR Traditional Cardiac Rehabilitation (TCR) *If criteria for ICR are not met will enroll in TCR [facility] only): Yes   Diet - low sodium heart healthy   Complete by: As directed    Discharge instructions   Complete by: As directed    Radial Site Care  Refer to this sheet in the next few weeks. These instructions provide you with information on caring for yourself after your procedure. Your caregiver may also give you more specific instructions. Your treatment has been planned according to current medical practices, but problems sometimes occur. Call your caregiver if you have any problems or questions after your procedure.  HOME CARE INSTRUCTIONS You may shower the day after the procedure. Remove the bandage (dressing) and gently wash the site with plain soap and water. Gently pat the site dry.  Do not apply powder or lotion to the site.  Do not submerge the affected site in water for 3 to 5 days.  Inspect the site at least twice daily.  Do not flex or bend the affected arm for 24 hours.  No lifting over 5 pounds (2.3 kg) for 5 days after your procedure.  Do not drive home if you are discharged the same day of the procedure. Have someone else drive you.  You may drive 24 hours after the procedure unless otherwise instructed by your caregiver.   What to expect: Any bruising will usually fade within 1 to 2 weeks.  Blood that collects in the tissue (hematoma)  may be painful to the touch. It should usually decrease in size and tenderness within 1 to 2 weeks.   SEEK IMMEDIATE MEDICAL CARE IF: You have unusual pain at the radial site.  You have redness, warmth, swelling, or pain at the radial site.  You have drainage (other than a small amount of blood on the dressing).  You have chills.  You have a fever or persistent symptoms for more than 72 hours.  You have a fever and your symptoms suddenly get worse.  Your arm becomes pale, cool, tingly, or numb.  You have heavy bleeding from the site. Hold pressure on the site.     PLEASE REMEMBER TO BRING ALL OF YOUR MEDICATIONS TO EACH OF YOUR FOLLOW-UP OFFICE VISITS.  PLEASE ATTEND ALL SCHEDULED FOLLOW-UP APPOINTMENTS.   Activity: Increase activity slowly as tolerated. You may shower, but no soaking baths (or swimming) for 1 week. No driving for 24 hours. No lifting over 5 lbs for 1 week. No sexual activity for 1 week.   You May Return to Work: until follow up with CT surgery and cardiology   Wound Care: You may wash cath site gently with soap and water. Keep cath site clean and dry. If you notice pain, swelling, bleeding or pus at your cath site, please call [phone].   Increase activity slowly   Complete by: As directed         Discharge Medications   Allergies as of  05/31/2023       Reactions   Crestor [rosuvastatin Calcium] Other (See Comments)   Myalgias   Plavix [clopidogrel Bisulfate]    Nonresponder to Plavix   Codeine Nausea And Vomiting        Medication List     TAKE these medications    aspirin EC 81 MG tablet Take 81 mg by mouth daily.   metoprolol succinate 25 MG 24 hr tablet Commonly known as: TOPROL-XL Take 1 tablet (25 mg total) by mouth daily.   nitroGLYCERIN 0.4 MG SL tablet Commonly known as: NITROSTAT Place 1 tablet (0.4 mg total) under the tongue every 5 (five) minutes x 3 doses as needed for chest pain (if no relief after 2nd dose, proceed to ED or  call 911).   prasugrel 10 MG Tabs tablet Commonly known as: EFFIENT Take 1 tablet (10 mg total) by mouth daily.           Outstanding Labs/Studies    Duration of Discharge Encounter: APP Time: 30 minutes   Signed, Cyndi Bender, NP 05/31/2023, 2:47 PM

## 2023-06-02 ENCOUNTER — Encounter (HOSPITAL_COMMUNITY): Payer: Self-pay | Admitting: Cardiology

## 2023-06-02 ENCOUNTER — Telehealth: Payer: Self-pay | Admitting: Cardiology

## 2023-06-02 LAB — LIPOPROTEIN A (LPA): Lipoprotein (a): 117.8 nmol/L — ABNORMAL HIGH (ref <75.0)

## 2023-06-02 NOTE — Telephone Encounter (Signed)
Pt calling in asking to speak with Dr. Diona Browner about how her Cath went. Please advise.

## 2023-06-02 NOTE — Telephone Encounter (Signed)
I did the cath, performed PTCA to old LAD stent, but she has more native and in stent disease that may be best treated with CABG. Dr. Tawanna Solo, please feel free to reach out to me if you have any questions.  Thanks MJP

## 2023-06-02 NOTE — Telephone Encounter (Signed)
Pt has appt with Dr. Cliffton Asters on 2/14 for TCTS consult. Pt is agreeable with plan.

## 2023-06-10 ENCOUNTER — Telehealth: Payer: Self-pay | Admitting: Cardiology

## 2023-06-10 ENCOUNTER — Ambulatory Visit: Payer: BC Managed Care – PPO

## 2023-06-10 NOTE — Telephone Encounter (Signed)
Patient would like to know if she needs to have echo prior to seeing Dr. Cliffton Asters. She has concerns with ice on the road and she is considering rescheduling echo. Please advise.

## 2023-06-10 NOTE — Telephone Encounter (Signed)
Requesting to reschedule her echo appointment today due to the weather.  Echo appointment for today at 3 pm changed to tomorrow at 1 pm per patient request.

## 2023-06-11 ENCOUNTER — Ambulatory Visit: Payer: BC Managed Care – PPO | Attending: Cardiology

## 2023-06-11 DIAGNOSIS — R079 Chest pain, unspecified: Secondary | ICD-10-CM

## 2023-06-12 LAB — ECHOCARDIOGRAM COMPLETE
AR max vel: 2.72 cm2
AV Area VTI: 2.56 cm2
AV Area mean vel: 2.76 cm2
AV Mean grad: 3 mm[Hg]
AV Peak grad: 6.1 mm[Hg]
Ao pk vel: 1.23 m/s
Area-P 1/2: 3.31 cm2
Calc EF: 65.5 %
MV VTI: 2.57 cm2
S' Lateral: 3 cm
Single Plane A2C EF: 66.8 %
Single Plane A4C EF: 63 %

## 2023-06-13 ENCOUNTER — Institutional Professional Consult (permissible substitution) (INDEPENDENT_AMBULATORY_CARE_PROVIDER_SITE_OTHER): Payer: BC Managed Care – PPO | Admitting: Thoracic Surgery (Cardiothoracic Vascular Surgery)

## 2023-06-13 ENCOUNTER — Other Ambulatory Visit: Payer: Self-pay | Admitting: Thoracic Surgery (Cardiothoracic Vascular Surgery)

## 2023-06-13 VITALS — BP 141/84 | HR 73 | Resp 18 | Ht 64.0 in | Wt 202.0 lb

## 2023-06-13 DIAGNOSIS — I2510 Atherosclerotic heart disease of native coronary artery without angina pectoris: Secondary | ICD-10-CM

## 2023-06-13 NOTE — Progress Notes (Signed)
[address]       [place] [address]             [phone]        Stacy Douglas [facility] Medical Record #[id number] Date of Birth: March 27, 1951  Referring: Elder Negus, MD Primary Care: Pcp, No Primary Cardiologist:Samuel Diona Browner, MD  Chief Complaint:    Chief Complaint  Patient presents with   Coronary Artery Disease    Cath 1/31, ECHO 2/1    History of Present Illness:     Stacy Douglas 73 y.o. female presents for surgical evaluation of three-vessel coronary artery disease.  She has a long history of coronary artery disease and has had PCI several years ago.  She has also had atypical chest pain which is characterized by back and shoulder pain.  She denies any exertional symptoms, and states that she is able to walk up 3 flights of stairs without any chest pain, shortness of breath, or shoulder pain.  She did recently have a presyncopal episode, which occurred while getting out of a chair.   Past Medical and Surgical History: Previous Chest Surgery: No Previous Chest Radiation: No Diabetes Mellitus: Yes.  HbA1C 6 Creatinine:  Lab Results  Component Value Date   CREATININE 0.91 05/31/2023   CREATININE 1.05 (H) 05/30/2023   CREATININE 0.99 05/21/2023     Past Medical History:  Diagnosis Date   Coronary atherosclerosis of native coronary artery    a. VF arrest in [place] s/p BMS LAD 5/11, PTCA circumflex 6/11, LVEF 40%. b. [place] 10/2014 s/p DES to prox and mid RCA, otherwise patent LAD stent and moderate disease in the Cx, LVEF 55-65%. c. low-risk NST in 06/2017 d. 09/2017: NSTEMI with cath showing 99% mid-LCx stenosis --> treated with DES; patent stents along LAD and RCA.    DVT (deep venous thrombosis) (HCC) 1992   LLE   Essential hypertension    History of duodenal ulcer 1960s   Mixed hyperlipidemia    Myocardial infarction (HCC) 08/2009   complicated by VF arrest   NSTEMI (non-ST elevated myocardial infarction) (HCC) 10/07/2017    [name] 10/07/2017   Obesity    Plavix resistance    Tobacco abuse     Past Surgical History:  Procedure Laterality Date   APPENDECTOMY     BREAST BIOPSY Right    CARDIAC CATHETERIZATION N/A 11/16/2014   Procedure: Left Heart Cath and Coronary Angiography;  Surgeon: Iran Ouch, MD;  Location: [facility] INVASIVE CV LAB;  Service: Cardiovascular;  Laterality: N/A;   CARDIAC CATHETERIZATION N/A 11/16/2014   Procedure: Coronary Stent Intervention;  Surgeon: Iran Ouch, MD;  Location: [facility] INVASIVE CV LAB;  Service: Cardiovascular;  Laterality: N/A;   CATARACT EXTRACTION W/ INTRAOCULAR LENS IMPLANT Right 1990s   CORONARY ANGIOPLASTY WITH STENT PLACEMENT  2011   CORONARY ANGIOPLASTY WITH STENT PLACEMENT  10/07/2017   CORONARY PRESSURE/FFR STUDY N/A 05/30/2023   Procedure: CORONARY PRESSURE/FFR STUDY;  Surgeon: Elder Negus, MD;  Location: [facility] INVASIVE CV LAB;  Service: Cardiovascular;  Laterality: N/A;   CORONARY STENT INTERVENTION N/A 10/07/2017   Procedure: CORONARY STENT INTERVENTION;  Surgeon: Swaz[place], Peter M, MD;  Location: [facility] INVASIVE CV LAB;  Service: Cardiovascular;  Laterality: N/A;   LEFT HEART CATH AND CORONARY ANGIOGRAPHY N/A 10/07/2017   Procedure: LEFT HEART CATH AND CORONARY ANGIOGRAPHY;  Surgeon: Swazil[place], Peter M, MD;  Location[facility] INVASIVE CV LAB;  Service: Cardiovascular;  Laterality: N/A;   LEFT  HEART CATH AND CORONARY ANGIOGRAPHY N/A 05/30/2023   Procedure: LEFT HEART CATH AND CORONARY ANGIOGRAPHY;  Surgeon: Elder Negus, MD;  Location: [facility] CV LAB;  Service: Cardiovascular;  Laterality: N/A;   MASS EXCISION  1980s   "tooks lumps off my neck; front and back"    Social History:  Social History   Tobacco Use  Smoking Status Some Days   Current packs/day: 0.01   Average packs/day: (0.6 ttl pk-yrs)   Types: Cigarettes   Start date: 08/10/1964  Smokeless Tobacco Never  Tobacco Comments   She is weaning herself down.     Social History   Substance  and Sexual Activity  Alcohol Use Not Currently     Allergies  Allergen Reactions   Crestor [Rosuvastatin Calcium] Other (See Comments)    Myalgias   Plavix [Clopidogrel Bisulfate]     Nonresponder to Plavix   Codeine Nausea And Vomiting   A without Anti-Coagulation: Effient  Current Outpatient Medications  Medication Sig Dispense Refill   aspirin EC 81 MG tablet Take 81 mg by mouth daily.     metoprolol succinate (TOPROL-XL) 25 MG 24 hr tablet Take 1 tablet (25 mg total) by mouth daily. 30 tablet 0   nitroGLYCERIN (NITROSTAT) 0.4 MG SL tablet Place 1 tablet (0.4 mg total) under the tongue every 5 (five) minutes x 3 doses as needed for chest pain (if no relief after 2nd dose, proceed to ED or call 911). 12 tablet 2   prasugrel (EFFIENT) 10 MG TABS tablet Take 1 tablet (10 mg total) by mouth daily. 30 tablet 1   No current facility-administered medications for this visit.    (Not in a hospital admission)   Family History  Problem Relation Age of Onset   Coronary artery disease Father        MI age 53   Diverticulitis Mother      Review of Systems:   Review of Systems  Constitutional:  Positive for malaise/fatigue.  Respiratory:  Positive for shortness of breath.   Cardiovascular:  Positive for chest pain.  Musculoskeletal:  Positive for back pain, joint pain and myalgias.  Neurological: Negative.       Physical Exam: BP (!) 141/84   Pulse 73   Resp 18   Ht 5\' 4"  (1.626 m)   Wt 202 lb (91.6 kg)   SpO2 97% Comment: RA  BMI 34.67 kg/m  Physical Exam Constitutional:      General: She is not in acute distress. HENT:     Head: Normocephalic and atraumatic.  Eyes:     Extraocular Movements: Extraocular movements intact.  Cardiovascular:     Rate and Rhythm: Normal rate.  Pulmonary:     Effort: Pulmonary effort is normal. No respiratory distress.  Abdominal:     General: There is no distension.  Musculoskeletal:        General: Normal range of motion.      Cervical back: Normal range of motion.  Skin:    General: Skin is warm and dry.  Neurological:     General: No focal deficit present.     Mental Status: She is alert and oriented to person, place, and time.       Diagnostic Studies & Laboratory data:    Left Heart Catherization:  Echo: IMPRESSIONS     1. Left ventricular ejection fraction, by estimation, is 65 to 70%. The  left ventricle has normal function. The left ventricle has no regional  wall motion abnormalities.  Left ventricular diastolic parameters are  consistent with Grade I diastolic  dysfunction (impaired relaxation). The global longitudinal strain is  indeterminate.   2. Right ventricular systolic function is normal. The right ventricular  size is normal.   3. The mitral valve is normal in structure. No evidence of mitral valve  regurgitation. No evidence of mitral stenosis.   4. The aortic valve is tricuspid. Aortic valve regurgitation is mild. No  aortic stenosis is present.   5. The inferior vena cava is normal in size with greater than 50%  respiratory variability, suggesting right atrial pressure of 3 mmHg.     EKG: Sinus  I have independently reviewed the above radiologic studies and discussed with the patient   Recent Lab Findings: Lab Results  Component Value Date   WBC 7.7 05/31/2023   HGB 13.8 05/31/2023   HCT 39.6 05/31/2023   PLT 262 05/31/2023   GLUCOSE 102 (H) 05/31/2023   CHOL 208 (H) 06/28/2021   TRIG 142 06/28/2021   HDL 46 06/28/2021   LDLCALC 137 (H) 06/28/2021   ALT 15 05/21/2023   AST 18 05/21/2023   NA 140 05/31/2023   K 4.0 05/31/2023   CL 107 05/31/2023   CREATININE 0.91 05/31/2023   BUN 13 05/31/2023   CO2 22 05/31/2023   TSH 2.159 07/01/2017   INR 1.05 11/15/2014   HGBA1C 6.0 (H) 11/15/2014      Assessment / Plan:   73 year old female with three-vessel coronary artery disease.  She has preserved biventricular function and no significant valvular disease on  echocardiogram.  On review of her left heart catheterization all of her vessels are small.  She does appear to have a good target on her LAD.  Her symptoms are very atypical and not associated with any exertion, thus I would like to present her case in cath conference to come up with a consensus plan.  In regards to her presyncopal episodes of ordered carotids.  I will touch base with her after cath conference.    I  spent 40 minutes counseling the patient face to face.   Corliss Skains 06/13/2023 10:32 AM

## 2023-06-16 ENCOUNTER — Ambulatory Visit: Payer: Self-pay | Admitting: Emergency Medicine

## 2023-06-16 NOTE — Heart Team MDD (Signed)
   Heart Team Multi-Disciplinary Discussion  Patient: Stacy Douglas  DOB: July 08, 1950  MRN: [id number]   Date: 06/16/2023  10:48 AM    Attendees: Interventional Cardiology: Verne Carrow, MD Nanetta Batty, MD Lorine Bears, MD Yates Decamp, MD Yvonne Kendall, MD    Additional Attendees: [place]   Patient History: 73 year old female with a long history of coronary artery disease and has had PCI several years ago.  She c/o atypical chest pain which is characterized by back and shoulder pain.  She denies any exertional symptoms, and states that she is able to walk up 3 flights of stairs without any chest pain, shortness of breath, or shoulder pain.  She did recently have a presyncopal episode, which occurred while getting out of a chair.        Review of Prior Angiography and PCI Procedures: The left heart cath and coronary angiography images from 1/06/28/2023 were reviewed and discussed in detail including: LM: Normal, LAD: Long proximal/mid LAD stent with 75% ISR, followed by de novo 75% focal stenosis. IFR 0.71, which remained low at 0.76 after PTCA to ISR, Ramus: Small to medium caliber vessel with long 80% disease proximally, Lcx: Moderate proximal diffuse 40% disease, followed by patent stent without significant restenosis. Following this, there is focal 75% stenosis. IFR 0.78, RCA: Prox-mid stent with 30% ISR.  Distal 60 to 80% diffuse stenosis. Discussion surrounded likelihood of restenosis with repeated stenting due to size of previous stents.     Discussion: After presentation, consider of treatment options occurred contrasting medical management with CABG. Discussion surrounded patient's minimal symptoms currently and benefits and risks of CABG. Consensus from the team was to treat medically at this time and wait until patient symptoms progress/worsen. At that time, repeat cath to verify blockages, placement, and size. Then refer to surgery for revascularization.     Recommendations: Medical Therapy CABG      Alison Murray, RN  06/16/2023 10:48 AM

## 2023-06-17 ENCOUNTER — Encounter (HOSPITAL_COMMUNITY): Payer: BC Managed Care – PPO

## 2023-06-20 ENCOUNTER — Ambulatory Visit: Payer: Medicare Other | Admitting: Family Medicine

## 2023-06-20 ENCOUNTER — Ambulatory Visit (INDEPENDENT_AMBULATORY_CARE_PROVIDER_SITE_OTHER): Payer: BC Managed Care – PPO | Admitting: Thoracic Surgery (Cardiothoracic Vascular Surgery)

## 2023-06-20 DIAGNOSIS — I2510 Atherosclerotic heart disease of native coronary artery without angina pectoris: Secondary | ICD-10-CM

## 2023-06-20 NOTE — Progress Notes (Signed)
     [address]       [place] [address]             [phone]       Patient: Home Provider: Office Consent for Telemedicine visit obtained.  Today's visit was completed via a real-time telehealth (see specific modality noted below). The patient/authorized person provided oral consent at the time of the visit to engage in a telemedicine encounter with the present provider at [facility]. The patient/authorized person was informed of the potential benefits, limitations, and risks of telemedicine. The patient/authorized person expressed understanding that the laws that protect confidentiality also apply to telemedicine. The patient/authorized person acknowledged understanding that telemedicine does not provide emergency services and that he or she would need to call 911 or proceed to the nearest hospital for help if such a need arose.   Total time spent in the clinical discussion 10 minutes.  Telehealth Modality: Phone visit (audio only)  I had a telephone visit with Mrs. Nash.  We discussed the decisions from cath conference.  She is agreeable to continue with medical management.  Samit Sylve Keane Scrape

## 2023-06-27 ENCOUNTER — Other Ambulatory Visit: Payer: BC Managed Care – PPO

## 2023-06-27 ENCOUNTER — Ambulatory Visit: Payer: BC Managed Care – PPO | Attending: Medical | Admitting: Medical

## 2023-06-27 ENCOUNTER — Encounter: Payer: Self-pay | Admitting: Medical

## 2023-06-27 VITALS — BP 126/84 | HR 68 | Ht 64.0 in | Wt 210.6 lb

## 2023-06-27 DIAGNOSIS — E782 Mixed hyperlipidemia: Secondary | ICD-10-CM | POA: Diagnosis not present

## 2023-06-27 DIAGNOSIS — I10 Essential (primary) hypertension: Secondary | ICD-10-CM

## 2023-06-27 DIAGNOSIS — I25110 Atherosclerotic heart disease of native coronary artery with unstable angina pectoris: Secondary | ICD-10-CM | POA: Diagnosis not present

## 2023-06-27 DIAGNOSIS — R55 Syncope and collapse: Secondary | ICD-10-CM | POA: Diagnosis not present

## 2023-06-27 DIAGNOSIS — I214 Non-ST elevation (NSTEMI) myocardial infarction: Secondary | ICD-10-CM | POA: Diagnosis not present

## 2023-06-27 MED ORDER — ISOSORBIDE MONONITRATE ER 30 MG PO TB24: 15.0000 mg | ORAL_TABLET | Freq: Every day | ORAL | 3 refills | Status: DC

## 2023-06-27 MED ORDER — PRASUGREL HCL 10 MG PO TABS: 10.0000 mg | ORAL_TABLET | Freq: Every day | ORAL | 1 refills | Status: DC

## 2023-06-27 NOTE — Progress Notes (Signed)
 Cardiology Office Note:  .   Date:  06/30/2023  ID:  Stacy Douglas, DOB [birth year], MRN [id number] PCP: Aviva Kluver  [facility] Providers Cardiologist:  Nona Dell, MD {    History of Present Illness: .   Stacy Douglas is a 73 y.o. female with a h/o CAD with prior stenting, HTN, HLD, tobacco use who presents for heart cath follow-up.   She has h/o CAD s/p VF arrest in [place] with BMS to LAD and PTCA of the Cx in 201, DES to the prox and mid RCA in 2016, and DES to the mid Cx in 2019. The patient recently underwent cardiac cath for accelerating angina. LHC showed  Long proximal/mid LAD stent with 75% ISR, followed by de novo 75% focal stenosis. IFR 0.71, which remained low at 0.76 after PTCA to ISR. Small to medium caliber Ramus with long 80% disease proximally. Moderate proximal LCx diffuse 40% disease, followed by patent stent without significant restenosis. Following this, there is focal 75% stenosis. iFR 0.78. Prox-mid RCA stent with 30% ISR.  Distal 60 to 80% diffuse stenosis.  Balloon angio was done, no PCI target was felt appropriate/see note for detail.  She was referred for CT surgery evaluation. CT surgery had arranged outpatient consult on 06/13/23.  She was recommended continue current dual antiplatelet therapy with aspirin 81 mg and Effient 10 mg, along with PTA metoprolol XL 25 mg for medical therapy.  See cath report for detail.   She saw CT surgery 2/14 who discussed case with the team and ultimately decided on medical treatment.   Echo showed LVEF 65-70%, no WMA, G1DD.  Today, the patient reports she is doing ok. She is taking Aspirin and Effient, needs a refill. She is planning on starting Repatha. She is having trouble with quitting smoking. She is smoking 3 cigarettes daily. She thinks she needs an antianxiety medication, which I recommended she see PCP for. She denies chest pain. A little shortness of breath.   She was in her garage a few weeks ago when getting up  from a chair. She got dizzy and lightheaded and passed out. This was the first time she passed out.     Studies Reviewed: .       Echo 05/2023  1. Left ventricular ejection fraction, by estimation, is 65 to 70%. The  left ventricle has normal function. The left ventricle has no regional  wall motion abnormalities. Left ventricular diastolic parameters are  consistent with Grade I diastolic  dysfunction (impaired relaxation). The global longitudinal strain is  indeterminate.   2. Right ventricular systolic function is normal. The right ventricular  size is normal.   3. The mitral valve is normal in structure. No evidence of mitral valve  regurgitation. No evidence of mitral stenosis.   4. The aortic valve is tricuspid. Aortic valve regurgitation is mild. No  aortic stenosis is present.   5. The inferior vena cava is normal in size with greater than 50%  respiratory variability, suggesting right atrial pressure of 3 mmHg.   Comparison(s): EF 55-60%.   LHC 04/2023 Coronary angiography 05/30/2023: LM: Normal LAD: Long proximal/mid LAD stent with 75% ISR, followed by de novo 75% focal stenosis.          IFR 0.71, which remained low at 0.76 after PTCA to ISR Ramus: Small to medium caliber vessel with long 80% disease proximally Lcx: Moderate proximal diffuse 40% disease, followed by patent stent without significant restenosis.  Following this, there is focal 75% stenosis.  iFR 0.78 RCA: Prox-mid stent with 30% ISR.  Distal 60 to 80% diffuse stenosis.   LVEDP 25 mmHg   I initially performed virtual FFR that was abnormal in LAD, but not left circumflex.  My initial thought process was to perform PTCA to the old LAD stent.  I used 6 [place] EBU 3.0 guide and wired LAD with pressure wire.  Mid LAD RFR was 0.71.  I performed postdilatation using 2.75 x 15 mm Cedar balloon up to 20 atm.  Distal RCA, moderate to severe mid left circumflex.  I repeated RFR at this time.  While RFR was slightly  improved, was still physiologically significant at 0.76.  At this time, also noted a de novo lesion that was outside the stent.  My goal was to not place new stents in LAD, which would take off the option of CABG.  Also, I performed invasive RFR on mid left circumflex, which was 0.78, unlike virtual FFR which erroneously called at 0.82, which would be normal.   At this point, I decided to perform no further intervention and rather discuss with CVTS if CABG would be an option.  The prior stents are to 7.5 mm or lower in diameter, and would run risk of recurrent restenosis with repeat stenting.  If CABG is deemed not to be an option either by the patient or the surgeons, we could consider PTCA with drug-coated balloon for LAD ISR, and stenting Denovo lesions in LAD, left circumflex, +/- RCA.   Patient can continue dual antiplatelet therapy with aspirin and Effient, which can be held at any point for bypass surgery.  This would not risk new stent thrombosis.  If CABG is not an option either for patient wishes or surgical risk, could consider DCB to LAD stent, followed by de novo stenting in mid LAD and mid left circumflex.        Elder Negus, MD      Physical Exam:   VS:  BP 126/84   Pulse 68   Ht 5\' 4"  (1.626 m)   Wt 210 lb 9.6 oz (95.5 kg)   SpO2 99%   BMI 36.15 kg/m    Wt Readings from Last 3 Encounters:  06/27/23 210 lb 9.6 oz (95.5 kg)  06/13/23 202 lb (91.6 kg)  05/30/23 210 lb 5.1 oz (95.4 kg)    GEN: Well nourished, well developed in no acute distress NECK: No JVD; No carotid bruits CARDIAC: RRR, no murmurs, rubs, gallops RESPIRATORY:  Clear to auscultation without rales, wheezing or rhonchi  ABDOMEN: Soft, non-tender, non-distended EXTREMITIES:  No edema; No deformity   ASSESSMENT AND PLAN: .    CAD Patient had recent cath (see cath report) and CABG consult was pursued. After multidisciplinary team met, they decided to not pursue CABG and continue with medical  management. She has poor target on her LAD and symptoms are minimal. Can consider repeat heart cath in the future if symptoms worsen. Patient is very concerned given h/o heart attack and stents. She denies chest pain, but does have DOE. Unsure if she would be considered for PCI, will have to discuss with MD. Continue ASA, Effient, SL NTG, metoprolol. I will start Imdur 15mg  daily.   Syncope She reports dizzy and lightheadedness when she got up from the chair a few weeks ago. US carotids has been scheduled. No further episodes. I will order a 2 week heart monitor. Orthostatics negative.   HTN BP today  is good. Add Imdur as above. Continue metoprolol 25mg  BID  HLD She is to start Repatha in the next few weeks. LDL 137 in 2023.     Dispo: Follow-up in 1-2 months  Signed, Neosha Switalski David Stall, PA-C

## 2023-06-27 NOTE — Patient Instructions (Addendum)
 Medication Instructions:  Your physician has recommended you make the following change in your medication:   -Start Imdur 15 mg tablet once daily   *If you need a refill on your cardiac medications before your next appointment, please call your pharmacy*   Lab Work: TSH MAG  If you have labs (blood work) drawn today and your tests are completely normal, you will receive your results only by: MyChart Message (if you have MyChart) OR A paper copy in the mail If you have any lab test that is abnormal or we need to change your treatment, we will call you to review the results.   Testing/Procedures: Zio Monitor   Follow-Up: At [facility], you and your health needs are our priority.  As part of our continuing mission to provide you with exceptional heart care, we have created designated Provider Care Teams.  These Care Teams include your primary Cardiologist (physician) and Advanced Practice Providers (APPs -  Physician Assistants and Nurse Practitioners) who all work together to provide you with the care you need, when you need it.  We recommend signing up for the patient portal called "MyChart".  Sign up information is provided on this After Visit Summary.  MyChart is used to connect with patients for Virtual Visits (Telemedicine).  Patients are able to view lab/test results, encounter notes, upcoming appointments, etc.  Non-urgent messages can be sent to your provider as well.   To learn more about what you can do with MyChart, go to [email or web address].    Your next appointment:    Keep scheduled follow up   Provider:   You may see Nona Dell, MD or one of the following Advanced Practice Providers on your designated Care Team:   Randall An, PA-C  Jacolyn Reedy, PA-C     Other Instructions Christena Deem- Long Term Monitor Instructions   Your physician has requested you wear your ZIO patch monitor___14____days.   This is a single patch monitor.  [employer]  supplies one patch monitor per enrollment.  Additional stickers are not available.   Please do not apply patch if you will be having a Nuclear Stress Test, Echocardiogram, Cardiac CT, MRI, or Chest Xray during the time frame you would be wearing the monitor. The patch cannot be worn during these tests.  You cannot remove and re-apply the ZIO XT patch monitor.   Your ZIO patch monitor will be sent USPS Priority mail from [facility] directly to your home address. The monitor may also be mailed to a PO BOX if home delivery is not available.   It may take 3-5 days to receive your monitor after you have been enrolled.   Once you have received you monitor, please review enclosed instructions.  Your monitor has already been registered assigning a specific monitor serial # to you.   Applying the monitor   Shave hair from upper left chest.   Hold abrader disc by orange tab.  Rub abrader in 40 strokes over left upper chest as indicated in your monitor instructions.   Clean area with 4 enclosed alcohol pads .  Use all pads to assure are is cleaned thoroughly.  Let dry.   Apply patch as indicated in monitor instructions.  Patch will be place under collarbone on left side of chest with arrow pointing upward.   Rub patch adhesive wings for 2 minutes.Remove white label marked "1".  Remove white label marked "2".  Rub patch adhesive wings for 2 additional minutes.   While looking  in a mirror, press and release button in center of patch.  A small green light will flash 3-4 times .  This will be your only indicator the monitor has been turned on.     Do not shower for the first 24 hours.  You may shower after the first 24 hours.   Press button if you feel a symptom. You will hear a small click.  Record Date, Time and Symptom in the Patient Log Book.   When you are ready to remove patch, follow instructions on last 2 pages of Patient Log Book.  Stick patch monitor onto last page of Patient Log  Book.   Place Patient Log Book in [place] box.  Use locking tab on box and tape box closed securely.  The [employer] and [employer] has [employer] on it.  Please place in mailbox as soon as possible.  Your physician should have your test results approximately 7 days after the monitor has been mailed back to [facility].   Call [facility] Customer Care at [phone] if you have questions regarding your ZIO XT patch monitor.  Call them immediately if you see an orange light blinking on your monitor.   If your monitor falls off in less than 4 days contact our Monitor department at [phone].  If your monitor becomes loose or falls off after 4 days call [employer] at [phone] for suggestions on securing your monitor.

## 2023-06-28 LAB — TSH: TSH: 1.5 u[IU]/mL (ref 0.450–4.500)

## 2023-06-28 LAB — MAGNESIUM: Magnesium: 2.2 mg/dL (ref 1.6–2.3)

## 2023-06-30 ENCOUNTER — Encounter (HOSPITAL_COMMUNITY): Payer: Self-pay

## 2023-07-01 ENCOUNTER — Ambulatory Visit: Payer: BC Managed Care – PPO | Attending: Thoracic Surgery (Cardiothoracic Vascular Surgery)

## 2023-07-01 DIAGNOSIS — I2510 Atherosclerotic heart disease of native coronary artery without angina pectoris: Secondary | ICD-10-CM

## 2023-07-02 ENCOUNTER — Encounter (HOSPITAL_COMMUNITY)
Admission: RE | Admit: 2023-07-02 | Discharge: 2023-07-02 | Disposition: A | Discharge status: Home / Self Care | Source: Ambulatory Visit | Attending: Cardiology | Admitting: Cardiology

## 2023-07-02 ENCOUNTER — Encounter (HOSPITAL_COMMUNITY): Payer: Self-pay

## 2023-07-02 DIAGNOSIS — Z9861 Coronary angioplasty status: Secondary | ICD-10-CM | POA: Insufficient documentation

## 2023-07-02 NOTE — Progress Notes (Signed)
 Completed virtual orientation today.  EP evaluation is scheduled for 07/09/23 at 1415 .  Documentation for diagnosis can be found in [facility] encounter cath report 05/30/23 and OV 06/27/23.  [place] is a current tobacco user. Intervention for tobacco cessation was provided at the initial medical review. She was asked about readiness to quit and reported that she is ready to quit once finds right method for her. She is not ready to set quit date yet.  Patient was advised and educated about tobacco cessation using combination therapy, tobacco cessation classes, quit line, and quit smoking apps. Patient demonstrated understanding of this material. Staff will continue to provide encouragement and follow up with the patient throughout the program.  Packet given to patient at orientation.

## 2023-07-09 ENCOUNTER — Encounter (HOSPITAL_COMMUNITY)
Admission: RE | Admit: 2023-07-09 | Discharge: 2023-07-09 | Disposition: A | Discharge status: Home / Self Care | Source: Ambulatory Visit | Attending: Cardiology | Admitting: Cardiology

## 2023-07-09 VITALS — Ht 62.5 in | Wt 210.5 lb

## 2023-07-09 DIAGNOSIS — Z9861 Coronary angioplasty status: Secondary | ICD-10-CM | POA: Diagnosis not present

## 2023-07-09 NOTE — Patient Instructions (Signed)
 Patient Instructions  Patient Details  Name: Stacy Douglas MRN: [id number] Date of Birth: 27-Oct-1950 Referring Provider:  Elder Negus, MD  Below are your personal goals for exercise, nutrition, and risk factors. Our goal is to help you stay on track towards obtaining and maintaining these goals. We will be discussing your progress on these goals with you throughout the program.  Initial Exercise Prescription:  Initial Exercise Prescription - 07/09/23 1500       Date of Initial Exercise RX and Referring Provider   Date 07/09/23    Referring Provider Truett Mainland MD      Oxygen   Maintain Oxygen Saturation 88% or higher      Treadmill   MPH 1.2    Grade 0    Minutes 15    METs 1.92      NuStep   Level 1    SPM 50    Minutes 15    METs 1.8      Track   Laps 15    Minutes 15    METs 1.5      Prescription Details   Frequency (times per week) 2    Duration Progress to 30 minutes of continuous aerobic without signs/symptoms of physical distress      Intensity   THRR 40-80% of Max Heartrate 109-136    Ratings of Perceived Exertion 11-13    Perceived Dyspnea 0-4      Resistance Training   Training Prescription Yes    Weight 3    Reps 10-15             Exercise Goals: Frequency: Be able to perform aerobic exercise two to three times per week in program working toward 2-5 days per week of home exercise.  Intensity: Work with a perceived exertion of 11 (fairly light) - 15 (hard) while following your exercise prescription.  We will make changes to your prescription with you as you progress through the program.   Duration: Be able to do 30 to 45 minutes of continuous aerobic exercise in addition to a 5 minute warm-up and a 5 minute cool-down routine.   Nutrition Goals: Your personal nutrition goals will be established when you do your nutrition analysis with the dietician.  The following are general nutrition guidelines to follow: Cholesterol <  200mg /day Sodium < 1500mg /day Fiber: Women over 50 yrs - 21 grams per day  Personal Goals:  Personal Goals and Risk Factors at Admission - 07/02/23 1333       Core Components/Risk Factors/Patient Goals on Admission    Weight Management Yes;Weight Loss    Intervention Weight Management: Develop a combined nutrition and exercise program designed to reach desired caloric intake, while maintaining appropriate intake of nutrient and fiber, sodium and fats, and appropriate energy expenditure required for the weight goal.;Weight Management: Provide education and appropriate resources to help participant work on and attain dietary goals.    Expected Outcomes Short Term: Continue to assess and modify interventions until short term weight is achieved;Long Term: Adherence to nutrition and physical activity/exercise program aimed toward attainment of established weight goal;Weight Loss: Understanding of general recommendations for a balanced deficit meal plan, which promotes 1-2 lb weight loss per week and includes a negative energy balance of [phone] kcal/d;Understanding recommendations for meals to include 15-35% energy as protein, 25-35% energy from fat, 35-60% energy from carbohydrates, less than 200mg  of dietary cholesterol, 20-35 gm of total fiber daily;Understanding of distribution of calorie intake throughout the day with the  consumption of 4-5 meals/snacks    Tobacco Cessation Yes    Number of packs per day .15    Intervention Assist the participant in steps to quit. Provide individualized education and counseling about committing to Tobacco Cessation, relapse prevention, and pharmacological support that can be provided by physician.;Education officer, environmental, assist with locating and accessing local/national Quit Smoking programs, and support quit date choice.    Expected Outcomes Short Term: Will demonstrate readiness to quit, by selecting a quit date.;Short Term: Will quit all tobacco product  use, adhering to prevention of relapse plan.;Long Term: Complete abstinence from all tobacco products for at least 12 months from quit date.    Hypertension Yes    Intervention Provide education on lifestyle modifcations including regular physical activity/exercise, weight management, moderate sodium restriction and increased consumption of fresh fruit, vegetables, and low fat dairy, alcohol moderation, and smoking cessation.;Monitor prescription use compliance.    Expected Outcomes Short Term: Continued assessment and intervention until BP is < 140/36mm HG in hypertensive participants. < 130/70mm HG in hypertensive participants with diabetes, heart failure or chronic kidney disease.;Long Term: Maintenance of blood pressure at goal levels.    Lipids Yes    Intervention Provide education and support for participant on nutrition & aerobic/resistive exercise along with prescribed medications to achieve LDL 70mg , HDL >40mg .    Expected Outcomes Short Term: Participant states understanding of desired cholesterol values and is compliant with medications prescribed. Participant is following exercise prescription and nutrition guidelines.;Long Term: Cholesterol controlled with medications as prescribed, with individualized exercise RX and with personalized nutrition plan. Value goals: LDL < 70mg , HDL > 40 mg.             Tobacco Use Initial Evaluation: Social History   Tobacco Use  Smoking Status Every Day   Current packs/day: 0.01   Average packs/day: (0.6 ttl pk-yrs)   Types: Cigarettes   Start date: 08/10/1964  Smokeless Tobacco Never  Tobacco Comments   She is weaning herself down.     Exercise Goals and Review:  Exercise Goals     Row Name 07/02/23 1327             Exercise Goals   Increase Physical Activity Yes       Intervention Provide advice, education, support and counseling about physical activity/exercise needs.;Develop an individualized exercise prescription for aerobic  and resistive training based on initial evaluation findings, risk stratification, comorbidities and participant's personal goals.       Expected Outcomes Short Term: Attend rehab on a regular basis to increase amount of physical activity.;Long Term: Add in home exercise to make exercise part of routine and to increase amount of physical activity.;Long Term: Exercising regularly at least 3-5 days a week.       Increase Strength and Stamina Yes       Intervention Provide advice, education, support and counseling about physical activity/exercise needs.;Develop an individualized exercise prescription for aerobic and resistive training based on initial evaluation findings, risk stratification, comorbidities and participant's personal goals.       Expected Outcomes Short Term: Increase workloads from initial exercise prescription for resistance, speed, and METs.;Short Term: Perform resistance training exercises routinely during rehab and add in resistance training at home;Long Term: Improve cardiorespiratory fitness, muscular endurance and strength as measured by increased METs and functional capacity ( )       Able to understand and use rate of perceived exertion (RPE) scale Yes       Intervention Provide education and explanation  on how to use RPE scale       Expected Outcomes Short Term: Able to use RPE daily in rehab to express subjective intensity level;Long Term:  Able to use RPE to guide intensity level when exercising independently       Able to understand and use Dyspnea scale Yes       Intervention Provide education and explanation on how to use Dyspnea scale       Expected Outcomes Short Term: Able to use Dyspnea scale daily in rehab to express subjective sense of shortness of breath during exertion;Long Term: Able to use Dyspnea scale to guide intensity level when exercising independently       Knowledge and understanding of Target Heart Rate Range (THRR) Yes       Intervention Provide education  and explanation of THRR including how the numbers were predicted and where they are located for reference       Expected Outcomes Short Term: Able to state/look up THRR;Long Term: Able to use THRR to govern intensity when exercising independently;Short Term: Able to use daily as guideline for intensity in rehab       Able to check pulse independently Yes       Intervention Provide education and demonstration on how to check pulse in carotid and radial arteries.;Review the importance of being able to check your own pulse for safety during independent exercise       Expected Outcomes Short Term: Able to explain why pulse checking is important during independent exercise;Long Term: Able to check pulse independently and accurately       Understanding of Exercise Prescription Yes       Intervention Provide education, explanation, and written materials on patient's individual exercise prescription       Expected Outcomes Short Term: Able to explain program exercise prescription;Long Term: Able to explain home exercise prescription to exercise independently                Copy of goals given to participant.

## 2023-07-09 NOTE — Progress Notes (Signed)
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 medications prescribed. Participant is following exercise prescription and nutrition guidelines.;Long Term: Cholesterol controlled with medications as prescribed, with individualized exercise RX and with personalized nutrition plan. Value goals: LDL < 70mg , HDL > 40 mg.             Core Components/Risk Factors/Patient Goals Review:   Goals and Risk Factor Review     Row Name 07/02/23 1335             Core Components/Risk Factors/Patient Goals Review   Personal Goals Review Tobacco Cessation       Review Eloisa is a current tobacco user. Intervention for tobacco cessation was provided at the initial medical review. She was asked about readiness to quit and reported that she is ready to quit once finds right  method for her. She is not ready to set quit date yet.  Patient was advised and educated about tobacco cessation using combination therapy, tobacco cessation classes, quit line, and quit smoking apps. Patient demonstrated understanding of this material. Staff will continue to provide encouragement and follow up with the patient throughout the program.  Packet given to patient at orientation.       Expected Outcomes Short: set quit date Long: Complete cessation                Core Components/Risk Factors/Patient Goals at Discharge (Final Review):   Goals and Risk Factor Review - 07/02/23 1335       Core Components/Risk Factors/Patient Goals Review   Personal Goals Review Tobacco Cessation    Review Haylyn is a current tobacco user. Intervention for tobacco cessation was provided at the initial medical review. She was asked about readiness to quit and reported that she is ready  to quit once finds right method for her. She is not ready to set quit date yet.  Patient was advised and educated about tobacco cessation using combination therapy, tobacco cessation classes, quit line, and quit smoking apps. Patient demonstrated understanding of this material. Staff will continue to provide encouragement and follow up with the patient throughout the program.  Packet given to patient at orientation.    Expected Outcomes Short: set quit date Long: Complete cessation             ITP Comments:  ITP Comments     Row Name 07/02/23 1326           ITP Comments Completed virtual orientation today.  EP evaluation is scheduled for 07/09/23 at 1415 .  Documentation for diagnosis can be found in [facility] encounter cath report 05/30/23 and OV 06/27/23.                Comments: Patient arrived for 1st visit/orientation/education at 68. Patient was referred to CR by Dr. Rosemary Holms with Dr. Diona Browner attending due to status post PTCA. During orientation advised patient on arrival and appointment times what to  wear, what to do before, during and after exercise. Reviewed attendance and class policy.  Pt is scheduled to return Cardiac Rehab on 07/10/23 at 1500. Pt was advised to come to class 15 minutes before class starts.  Discussed RPE/Dpysnea scales. Patient participated in warm up stretches. Patient was able to complete 6 minute walk test.  Telemetry:NSR. Patient was measured for the equipment. Discussed equipment safety with patient. Took patient pre-anthropometric measurements. Patient finished visit at 1515.

## 2023-07-10 ENCOUNTER — Encounter (HOSPITAL_COMMUNITY)
Admission: RE | Admit: 2023-07-10 | Discharge: 2023-07-10 | Disposition: A | Discharge status: Home / Self Care | Source: Ambulatory Visit | Attending: Cardiology | Admitting: Cardiology

## 2023-07-10 DIAGNOSIS — Z9861 Coronary angioplasty status: Secondary | ICD-10-CM | POA: Diagnosis not present

## 2023-07-10 NOTE — Progress Notes (Signed)
 Daily Session Note  Patient Details  Name: Stacy Douglas MRN: [id number] Date of Birth: 06-02-1950 Referring Provider:   Flowsheet Row CARDIAC REHAB PHASE II ORIENTATION from 07/09/2023 in [facility] CARDIAC REHABILITATION  Referring Provider Truett Mainland MD       Encounter Date: 07/10/2023  Check In:  Session Check In - 07/10/23 1430       Check-In   Supervising physician immediately available to respond to emergencies See telemetry face sheet for immediately available MD    Location [facility]    Staff Present Avanell Shackleton BSN, RN;Jessica [place], [place], RCEP, CCRP, Dow Adolph, RN, BSN    Virtual Visit No    Medication changes reported     No    Fall or balance concerns reported    No    Tobacco Cessation No Change    Warm-up and Cool-down Performed on first and last piece of equipment    Resistance Training Performed Yes    VAD Patient? No    PAD/SET Patient? No      Pain Assessment   Currently in Pain? No/denies    Multiple Pain Sites No             Capillary Blood Glucose: No results found for this or any previous visit (from the past 24 hours).    Social History   Tobacco Use  Smoking Status Every Day   Current packs/day: 0.01   Average packs/day: (0.6 ttl pk-yrs)   Types: Cigarettes   Start date: 08/10/1964  Smokeless Tobacco Never  Tobacco Comments   She is weaning herself down.     Goals Met:  Independence with exercise equipment Exercise tolerated well No report of concerns or symptoms today Strength training completed today  Goals Unmet:  Not Applicable  Comments: Marland KitchenMarland KitchenFirst full day of exercise!  Patient was oriented to gym and equipment including functions, settings, policies, and procedures.  Patient's individual exercise prescription and treatment plan were reviewed.  All starting workloads were established based on the results of the 6 minute walk test done at initial orientation visit.  The plan for  exercise progression was also introduced and progression will be customized based on patient's performance and goals.

## 2023-07-11 ENCOUNTER — Encounter (HOSPITAL_COMMUNITY)
Admission: RE | Admit: 2023-07-11 | Discharge: 2023-07-11 | Disposition: A | Discharge status: Home / Self Care | Source: Ambulatory Visit | Attending: Cardiology | Admitting: Cardiology

## 2023-07-11 DIAGNOSIS — Z9861 Coronary angioplasty status: Secondary | ICD-10-CM | POA: Diagnosis not present

## 2023-07-11 NOTE — Progress Notes (Addendum)
 Daily Session Note  Patient Details  Name: Stacy Douglas MRN: [id number] Date of Birth: 10-08-1950 Referring Provider:   Flowsheet Row CARDIAC REHAB PHASE II ORIENTATION from 07/09/2023 in [facility] CARDIAC REHABILITATION  Referring Provider Truett Mainland MD       Encounter Date: 07/11/2023  Check In:  Session Check In - 07/11/23 0900       Check-In   Supervising physician immediately available to respond to emergencies See telemetry face sheet for immediately available MD    Location [facility]    Staff Present Rodena Medin, RN, BSN;Brittany Foley, BSN, RN;Jessica [place], MA, RCEP, CCRP, CCET    Virtual Visit No    Medication changes reported     No    Fall or balance concerns reported    No    Tobacco Cessation No Change    Current number of cigarettes/nicotine per day     3    Warm-up and Cool-down Performed on first and last piece of equipment    Resistance Training Performed Yes    VAD Patient? No    PAD/SET Patient? No      Pain Assessment   Currently in Pain? No/denies    Multiple Pain Sites No             Capillary Blood Glucose: No results found for this or any previous visit (from the past 24 hours).    Social History   Tobacco Use  Smoking Status Every Day   Current packs/day: 0.01   Average packs/day: (0.6 ttl pk-yrs)   Types: Cigarettes   Start date: 08/10/1964  Smokeless Tobacco Never  Tobacco Comments   She is weaning herself down.     Goals Met:  Independence with exercise equipment Exercise tolerated well No report of concerns or symptoms today Strength training completed today  Goals Unmet:  Not Applicable  Comments: Pt able to follow exercise prescription today without complaint.  Will continue to monitor for progression.

## 2023-07-14 ENCOUNTER — Ambulatory Visit: Payer: BC Managed Care – PPO | Attending: General Practice | Admitting: Pharmacist

## 2023-07-14 DIAGNOSIS — I2510 Atherosclerotic heart disease of native coronary artery without angina pectoris: Secondary | ICD-10-CM

## 2023-07-14 NOTE — Progress Notes (Signed)
 Patient ID: Stacy Douglas                 DOB: 1951-01-14                    MRN: [id number]      HPI: Stacy Douglas is a 73 y.o. female patient of Dr. Ival Bible referred to lipid clinic by Dr. Rosemary Holms. PMH is significant for CAD with prior stenting, HTN, HLD, tobacco use.   She has h/o CAD s/p VF arrest in [place] with BMS to LAD and PTCA of the Cx in 2001, DES to the prox and mid RCA in 2016, and DES to the mid Cx in 2019. The patient recently underwent cardiac cath for accelerating angina. LHC showed  Long proximal/mid LAD stent with 75% ISR, followed by de novo 75% focal stenosis. IFR 0.71, which remained low at 0.76 after PTCA to ISR. Small to medium caliber Ramus with long 80% disease proximally. Moderate proximal LCx diffuse 40% disease, followed by patent stent without significant restenosis. Following this, there is focal 75% stenosis. iFR 0.78. Prox-mid RCA stent with 30% ISR.  Distal 60 to 80% diffuse stenosis.  Balloon angio was done, no PCI target was felt appropriate/see note for detail.  She was referred for CT surgery evaluation. Decided on medical treatment.   Patient was previously on rosuvastatin but did not tolerate due to muscle cramps. She has been on 20mg , 10mg  and 5mg . She also did not tolerate zetia. Patient does not want to try any more statins.   Looks like Repatha may have been discussed in the past, but maybe there was some financial barriers.  Repatha reviewed with patient along with side effects, injection technique and cost  Current Medications: none Intolerances: rosuvastatin 20, 10, 5 (cramps), ezetimibe Risk Factors: progressive ASCVD LDL-C goal: <55  Labs: Lipid Panel     Component Value Date/Time   CHOL 208 (H) 06/28/2021 0945   TRIG 142 06/28/2021 0945   TRIG 118 11/02/2013 1428   HDL 46 06/28/2021 0945   HDL 41 11/02/2013 1428   CHOLHDL 4.5 (H) 06/28/2021 0945   CHOLHDL 3.3 12/18/2017 1202   VLDL 23 10/08/2017 0510   LDLCALC 137 (H)  06/28/2021 0945   LDLCALC 85 12/18/2017 1202   LDLCALC 103 (H) 11/02/2013 1428   LABVLDL 25 06/28/2021 0945    Past Medical History:  Diagnosis Date   Coronary atherosclerosis of native coronary artery    a. VF arrest in [place] s/p BMS LAD 5/11, PTCA circumflex 6/11, LVEF 40%. b. [place] 10/2014 s/p DES to prox and mid RCA, otherwise patent LAD stent and moderate disease in the Cx, LVEF 55-65%. c. low-risk NST in 06/2017 d. 09/2017: NSTEMI with cath showing 99% mid-LCx stenosis --> treated with DES; patent stents along LAD and RCA.    DVT (deep venous thrombosis) (HCC) 1992   LLE   Essential hypertension    History of duodenal ulcer 1960s   Mixed hyperlipidemia    Myocardial infarction (HCC) 08/2009   complicated by VF arrest   NSTEMI (non-ST elevated myocardial infarction) (HCC) 10/07/2017   [name] 10/07/2017   Obesity    Plavix resistance    Tobacco abuse     Current Outpatient Medications on File Prior to Visit  Medication Sig Dispense Refill   aspirin EC 81 MG tablet Take 81 mg by mouth daily.     isosorbide mononitrate (IMDUR) 30 MG 24 hr tablet Take 0.5 tablets (15 mg total) by  mouth daily. 45 tablet 3   metoprolol succinate (TOPROL-XL) 25 MG 24 hr tablet Take 1 tablet (25 mg total) by mouth daily. 30 tablet 0   nitroGLYCERIN (NITROSTAT) 0.4 MG SL tablet Place 1 tablet (0.4 mg total) under the tongue every 5 (five) minutes x 3 doses as needed for chest pain (if no relief after 2nd dose, proceed to ED or call 911). 12 tablet 2   prasugrel (EFFIENT) 10 MG TABS tablet Take 1 tablet (10 mg total) by mouth daily. 30 tablet 1   No current facility-administered medications on file prior to visit.    Allergies  Allergen Reactions   Crestor [Rosuvastatin Calcium] Other (See Comments)    Myalgias   Plavix [Clopidogrel Bisulfate]     Nonresponder to Plavix   Codeine Nausea And Vomiting    Assessment/Plan:  1. 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, [place], [place] [address]  Phone: [phone]; Fax: [phone]

## 2023-07-14 NOTE — Assessment & Plan Note (Signed)
 Assessment: Patient with progressive ASCVD  LDL-C not at goal of <55 Not on any cholesterol medications Did not tolerate any dose of rosuvastatin due to cramps and does not want to try another statin Repatha reviewed with patient along with side effects, injection technique and cost Patient is still working and has BCBS with her employer She can use copay card for Repatha but will need to call [phone]  Plan: Submit PA for Repatha Labs in 3 months

## 2023-07-15 ENCOUNTER — Encounter: Payer: Self-pay | Admitting: Family Medicine

## 2023-07-15 ENCOUNTER — Ambulatory Visit (INDEPENDENT_AMBULATORY_CARE_PROVIDER_SITE_OTHER): Admitting: Family Medicine

## 2023-07-15 VITALS — BP 118/66 | HR 75 | Temp 98.0°F | Ht 62.5 in | Wt 208.0 lb

## 2023-07-15 DIAGNOSIS — I2510 Atherosclerotic heart disease of native coronary artery without angina pectoris: Secondary | ICD-10-CM | POA: Diagnosis not present

## 2023-07-15 DIAGNOSIS — F17210 Nicotine dependence, cigarettes, uncomplicated: Secondary | ICD-10-CM

## 2023-07-15 DIAGNOSIS — F17200 Nicotine dependence, unspecified, uncomplicated: Secondary | ICD-10-CM

## 2023-07-15 DIAGNOSIS — R600 Localized edema: Secondary | ICD-10-CM

## 2023-07-15 MED ORDER — NICOTINE POLACRILEX 4 MG MT GUM: 4.0000 mg | CHEWING_GUM | OROMUCOSAL | 0 refills | Status: DC | PRN

## 2023-07-15 NOTE — Progress Notes (Signed)
 New Patient Office Visit  Subjective   Patient ID: Stacy Douglas, female    DOB: 03-13-1951  Age: 73 y.o. MRN: [id number]  CC:  Chief Complaint  Patient presents with   New Patient (Initial Visit)    Establish care Smoking cessation    HPI Stacy Douglas presents to establish care. She was previously a patient of [facility] several years ago. Then moved to [place]. States that she was in [place] when she had her first MI. This was 14 years ago. Reports 4 MI's total.   She recently moved back to be closer to family. She was involved in [place].  She was in an apartment and her family could not locate her for 10 days. She works for [employer].  She has a history of CAD with recent PCI placement in January of 2025. History of HTN, gastroenteritis, HLD, Tobacco use.  She is established with Cardiology, Diona Browner, MD.  Has follow up on April 1st with [facility].  She is not currently taking imdur.  Denies chest pain.   Today her main concern is discussing smoking cessation. She would like to quit. She currently smokes 3 cigarettes per day. States that she has never smoked more than 1/2 PPD. She states that she has triggers with stress/anxiety. She has tried wellbutrin in the past and it did not help her. Reports that when she was living in [place], she was painting a lot and that helped her avoid smoking. She has not tried Nicoderm or Nicorette gum.   In addition, she has noticed swelling in her left calf. States that she had DVT ~30 years ago in this leg. She started swelling after walking at cardiac rehab approximately 1 week ago. She elevated leg at night and it improved. She is walking on concrete at work, which worsens her symptoms. She is not wearing compression stockings. Endorses pain with continued walking.    Outpatient Encounter Medications as of 07/15/2023  Medication Sig   aspirin EC 81 MG tablet Take 81 mg by mouth daily.   metoprolol succinate (TOPROL-XL) 25 MG 24 hr tablet  Take 1 tablet (25 mg total) by mouth daily.   nitroGLYCERIN (NITROSTAT) 0.4 MG SL tablet Place 1 tablet (0.4 mg total) under the tongue every 5 (five) minutes x 3 doses as needed for chest pain (if no relief after 2nd dose, proceed to ED or call 911).   prasugrel (EFFIENT) 10 MG TABS tablet Take 1 tablet (10 mg total) by mouth daily.   isosorbide mononitrate (IMDUR) 30 MG 24 hr tablet Take 0.5 tablets (15 mg total) by mouth daily. (Patient not taking: Reported on 07/15/2023)   No facility-administered encounter medications on file as of 07/15/2023.    Past Medical History:  Diagnosis Date   Coronary atherosclerosis of native coronary artery    a. VF arrest in [place] s/p BMS LAD 5/11, PTCA circumflex 6/11, LVEF 40%. b. [place] 10/2014 s/p DES to prox and mid RCA, otherwise patent LAD stent and moderate disease in the Cx, LVEF 55-65%. c. low-risk NST in 06/2017 d. 09/2017: NSTEMI with cath showing 99% mid-LCx stenosis --> treated with DES; patent stents along LAD and RCA.    DVT (deep venous thrombosis) (HCC) 1992   LLE   Essential hypertension    History of duodenal ulcer 1960s   Mixed hyperlipidemia    Myocardial infarction (HCC) 08/2009   complicated by VF arrest   NSTEMI (non-ST elevated myocardial infarction) (HCC) 10/07/2017   [name] 10/07/2017  Obesity    Plavix resistance    Tobacco abuse     Past Surgical History:  Procedure Laterality Date   APPENDECTOMY     BREAST BIOPSY Right    CARDIAC CATHETERIZATION N/A 11/16/2014   Procedure: Left Heart Cath and Coronary Angiography;  Surgeon: Iran Ouch, MD;  Location: [facility] INVASIVE CV LAB;  Service: Cardiovascular;  Laterality: N/A;   CARDIAC CATHETERIZATION N/A 11/16/2014   Procedure: Coronary Stent Intervention;  Surgeon: Iran Ouch, MD;  Location: [facility] INVASIVE CV LAB;  Service: Cardiovascular;  Laterality: N/A;   CATARACT EXTRACTION W/ INTRAOCULAR LENS IMPLANT Right 1990s   CORONARY ANGIOPLASTY WITH STENT PLACEMENT  2011    CORONARY ANGIOPLASTY WITH STENT PLACEMENT  10/07/2017   CORONARY PRESSURE/FFR STUDY N/A 05/30/2023   Procedure: CORONARY PRESSURE/FFR STUDY;  Surgeon: Elder Negus, MD;  Location: [facility] INVASIVE CV LAB;  Service: Cardiovascular;  Laterality: N/A;   CORONARY STENT INTERVENTION N/A 10/07/2017   Procedure: CORONARY STENT INTERVENTION;  Surgeon: Swaziland, Peter M, MD;  Location: [facility] INVASIVE CV LAB;  Service: Cardiovascular;  Laterality: N/A;   LEFT HEART CATH AND CORONARY ANGIOGRAPHY N/A 10/07/2017   Procedure: LEFT HEART CATH AND CORONARY ANGIOGRAPHY;  Surgeon: Swaziland, Peter M, MD;  [facility] INVASIVE CV LAB;  Service: Cardiovascular;  Laterality: N/A;   LEFT HEART CATH AND CORONARY ANGIOGRAPHY N/A 05/30/2023   Procedure: LEFT HEART CATH AND CORONARY ANGIOGRAPHY;  Surgeon: Elder Negus, MD;  Location: [facility] INVASIVE CV LAB;  Service: Cardiovascular;  Laterality: N/A;   MASS EXCISION  1980s   "tooks lumps off my neck; front and back"    Family History  Problem Relation Age of Onset   Coronary artery disease Father        MI age 79   Diverticulitis Mother     Social History   Socioeconomic History   Marital status: Divorced    Spouse name: Not on file   Number of children: Not on file   Years of education: Not on file   Highest education level: Some college, no degree  Occupational History   Occupation: [employer]  Tobacco Use   Smoking status: Every Day    Current packs/day: 0.01    Average packs/day: (0.6 ttl pk-yrs)    Types: Cigarettes    Start date: 08/10/1964   Smokeless tobacco: Never   Tobacco comments:    She is weaning herself down.   Vaping Use   Vaping status: Never Used  Substance and Sexual Activity   Alcohol use: Not Currently   Drug use: Not Currently   Sexual activity: Not Currently  Other Topics Concern   Not on file  Social History Narrative   Not on file   Social Drivers of Health   Financial Resource Strain: Low Risk  (07/14/2023)   Overall  Financial Resource Strain (CARDIA)    Difficulty of Paying Living Expenses: Not very hard  Food Insecurity: No Food Insecurity (07/14/2023)   Hunger Vital Sign    Worried About Running Out of Food in the Last Year: Never true    Ran Out of Food in the Last Year: Never true  Transportation Needs: No Transportation Needs (07/14/2023)   PRAPARE - Administrator, Civil Service (Medical): No    Lack of Transportation (Non-Medical): No  Physical Activity: Unknown (07/14/2023)   Exercise Vital Sign    Days of Exercise per Week: 0 days    Minutes of Exercise per Session: Not on file  Stress:  No Stress Concern Present (07/14/2023)   [employer] of Occupational Health - Occupational Stress Questionnaire    Feeling of Stress : Only a little  Social Connections: Moderately Integrated (07/14/2023)   Social Connection and Isolation Panel [NHANES]    Frequency of Communication with Friends and Family: Three times a week    Frequency of Social Gatherings with Friends and Family: Once a week    Attends Religious Services: More than 4 times per year    Active Member of [employer] or Organizations: No    Attends Engineer, structural: More than 4 times per year    Marital Status: Divorced  Catering manager Violence: Not At Risk (05/30/2023)   Humiliation, Afraid, Rape, and Kick questionnaire    Fear of Current or Ex-Partner: No    Emotionally Abused: No    Physically Abused: No    Sexually Abused: No    ROS As per HPI    Objective   BP 118/66   Pulse 75   Temp 98 F (36.7 C)   Ht 5' 2.5" (1.588 m)   Wt 208 lb (94.3 kg)   SpO2 98%   BMI 37.44 kg/m   Physical Exam Constitutional:      General: She is awake. She is not in acute distress.    Appearance: Normal appearance. She is well-developed and well-groomed. She is morbidly obese. She is not ill-appearing, toxic-appearing or diaphoretic.  Cardiovascular:     Rate and Rhythm: Normal rate and regular rhythm.     Pulses:  Normal pulses.          Radial pulses are 2+ on the right side and 2+ on the left side.       Posterior tibial pulses are 2+ on the right side and 2+ on the left side.     Heart sounds: Normal heart sounds. No murmur heard.    No gallop.  Pulmonary:     Effort: Pulmonary effort is normal. No respiratory distress.     Breath sounds: Normal breath sounds. No stridor. No wheezing, rhonchi or rales.  Musculoskeletal:     Cervical back: Full passive range of motion without pain and neck supple.     Right lower leg: No edema.     Left lower leg: Swelling and tenderness present. No deformity or lacerations. 1+ Edema present.     Comments: Positive Homan sign   Skin:    General: Skin is warm.     Capillary Refill: Capillary refill takes less than 2 seconds.  Neurological:     General: No focal deficit present.     Mental Status: She is alert, oriented to person, place, and time and easily aroused. Mental status is at baseline.     GCS: GCS eye subscore is 4. GCS verbal subscore is 5. GCS motor subscore is 6.     Motor: No weakness.  Psychiatric:        Attention and Perception: Attention and perception normal.        Mood and Affect: Mood and affect normal.        Speech: Speech normal.        Behavior: Behavior normal. Behavior is cooperative.        Thought Content: Thought content normal. Thought content does not include homicidal or suicidal ideation. Thought content does not include homicidal or suicidal plan.        Cognition and Memory: Cognition and memory normal.        Judgment: Judgment  normal.        07/15/2023    1:30 PM 07/09/2023    3:18 PM 03/13/2018    2:43 PM  Depression screen PHQ 2/9  Decreased Interest 0 0 0  Down, Depressed, Hopeless 0 0 0  PHQ - 2 Score 0 0 0  Altered sleeping 0 0 1  Tired, decreased energy 0 0 0  Change in appetite 1 1 0  Feeling bad or failure about yourself  0 0 0  Trouble concentrating 0 0 0  Moving slowly or fidgety/restless 0 1 0   Suicidal thoughts 0 0 0  PHQ-9 Score 1 2 1   Difficult doing work/chores Not difficult at all Not difficult at all Not difficult at all      07/15/2023    1:32 PM  GAD 7 : Generalized Anxiety Score  Nervous, Anxious, on Edge 1  Control/stop worrying 0  Worry too much - different things 0  Trouble relaxing 0  Restless 0  Easily annoyed or irritable 1  Afraid - awful might happen 0  Total GAD 7 Score 2   Assessment & Plan:  1. Tobacco use disorder (Primary) Will start medication as below. Discussed strategies for quitting with patient.  - nicotine polacrilex (NICORETTE) 4 MG gum; Take 1 each (4 mg total) by mouth as needed for smoking cessation.  Dispense: 100 tablet; Refill: 0  2. Edema of left lower extremity Imaging as below. Will communicate results to patient once available. Will await results to determine next steps.  Discussed red flag symptoms with patient.  - US Venous Img Lower Unilateral Left (DVT); Future  3. Atherosclerosis of native coronary artery of native heart, unspecified whether angina present Established with Cardiology. Patient to continue to follow with specialty. Reviewed notes with Fransico Michael, [place] 06/27/23. Patient has stopped Imdur. Encouraged her to speak to cardiology about this.    The above assessment and management plan was discussed with the patient. The patient verbalized understanding of and has agreed to the management plan using shared-decision making. Patient is aware to call the clinic if they develop any new symptoms or if symptoms fail to improve or worsen. Patient is aware when to return to the clinic for a follow-up visit. Patient educated on when it is appropriate to go to the emergency department.   Return in about 3 months (around 10/15/2023) for CPE, tobacco use .   Neale Burly, DNP-FNP [facility] [address] [place], [place] [address] [phone]

## 2023-07-15 NOTE — Patient Instructions (Signed)
Weeks 1 to 6: Chew 1 piece of gum every 1 to 2 hours (maximum: 24 pieces/Abramo); to increase chances of quitting, chew at least 9 pieces/Wolak during the first 6 weeks.    Weeks 7 to 9: Chew 1 piece of gum every 2 to 4 hours (maximum: 24 pieces/Fadeley).    Weeks 10 to 12: Chew 1 piece of gum every 4 to 8 hours (maximum: 24 pieces/Mcnicholas).

## 2023-07-16 ENCOUNTER — Telehealth: Payer: Self-pay | Admitting: Pharmacy Technician

## 2023-07-16 ENCOUNTER — Telehealth: Payer: Self-pay | Admitting: Family Medicine

## 2023-07-16 ENCOUNTER — Other Ambulatory Visit (HOSPITAL_COMMUNITY)

## 2023-07-16 ENCOUNTER — Other Ambulatory Visit (HOSPITAL_COMMUNITY): Payer: Self-pay

## 2023-07-16 DIAGNOSIS — R600 Localized edema: Secondary | ICD-10-CM

## 2023-07-16 MED ORDER — REPATHA SURECLICK 140 MG/ML ~~LOC~~ SOAJ
1.0000 mL | SUBCUTANEOUS | 11 refills | Status: AC
Start: 2023-07-16 — End: ?

## 2023-07-16 NOTE — Addendum Note (Signed)
 Addended by: Malena Peer D on: 07/16/2023 02:33 PM   Modules accepted: Orders

## 2023-07-16 NOTE — Telephone Encounter (Signed)
 Spoke with [facility] and for this to be scheduled at their Office the order needs to be changed to a Vascular DVT order.  Patient refused to have done at [facility] today, Patient refused to have done at the Hospital in [place] due to they are "[facility]  Please correct order and I will schedule with Office.

## 2023-07-16 NOTE — Telephone Encounter (Signed)
 Tried to call pt to let her know that Repatha was approved and remind her to call [phone] for copay card. VM full

## 2023-07-16 NOTE — Telephone Encounter (Signed)
 Pharmacy Patient Advocate Encounter   Received notification from  cc chart  that prior authorization for repatha is required/requested.   Insurance verification completed.   The patient is insured through [employer] .   Per test claim: PA required; PA submitted to above mentioned insurance via CoverMyMeds Key/confirmation #/EOC [id number] Status is pending

## 2023-07-16 NOTE — Telephone Encounter (Signed)
 Pharmacy Patient Advocate Encounter  Received notification from [facility] that Prior Authorization for repatha has been APPROVED from 07/16/23 to 07/15/24. Ran test claim, Copay is $35.00. This test claim was processed through [facility]- copay amounts may vary at other pharmacies due to pharmacy/plan contracts, or as the patient moves through the different stages of their insurance plan.   PA #/Case ID/Reference #: [id number]

## 2023-07-17 ENCOUNTER — Encounter (HOSPITAL_COMMUNITY)
Admission: RE | Admit: 2023-07-17 | Discharge: 2023-07-17 | Disposition: A | Discharge status: Home / Self Care | Source: Ambulatory Visit | Attending: Cardiology | Admitting: Cardiology

## 2023-07-17 ENCOUNTER — Encounter: Payer: Self-pay | Admitting: Pharmacist

## 2023-07-17 DIAGNOSIS — Z9861 Coronary angioplasty status: Secondary | ICD-10-CM | POA: Diagnosis not present

## 2023-07-17 NOTE — Progress Notes (Addendum)
 Daily Session Note  Patient Details  Name: Stacy Douglas MRN: [id number] Date of Birth: 1950-09-28 Referring Provider:   Flowsheet Row CARDIAC REHAB PHASE II ORIENTATION from 07/09/2023 in [facility] CARDIAC RE[facility]IL[facility]  Referring Provider Truett Mainland MD       Encounter Date: 07/17/2023  Check In:  Session Check In - 07/17/23 1430       Check-In   Supervising physician immediately available to respond to emergencies See telemetry face sheet for immediately available MD    Location [facility]    Staff Present Rodena Medin, RN, BSN;Hillary Troutman BSN, RN;Heather Fredric Mare, BS, Exercise Physiologist    Virtual Visit No    Medication changes reported     Yes    Comments Patient states she stopped Imdur on her own but she notified her MD. I do not see a note that MD was notified.    Fall or balance concerns reported    No    Tobacco Cessation No Change    Current number of cigarettes/nicotine per day     3    Warm-up and Cool-down Performed on first and last piece of equipment    Resistance Training Performed Yes    VAD Patient? No    PAD/SET Patient? No      Pain Assessment   Currently in Pain? No/denies    Multiple Pain Sites No             Capillary Blood Glucose: No results found for this or any previous visit (from the past 24 hours).    Social History   Tobacco Use  Smoking Status Every Day   Current packs/day: 0.01   Average packs/day: (0.6 ttl pk-yrs)   Types: Cigarettes   Start date: 08/10/1964  Smokeless Tobacco Never  Tobacco Comments   She is weaning herself down.     Goals Met:  Independence with exercise equipment Exercise tolerated well No report of concerns or symptoms today Strength training completed today  Goals Unmet:  Not Applicable  Comments: Pt able to follow exercise prescription today without complaint.  Will continue to monitor for progression.

## 2023-07-17 NOTE — Telephone Encounter (Signed)
 Left detailed message.

## 2023-07-18 ENCOUNTER — Encounter (HOSPITAL_COMMUNITY)
Admission: RE | Admit: 2023-07-18 | Discharge: 2023-07-18 | Disposition: A | Discharge status: Home / Self Care | Source: Ambulatory Visit | Attending: Cardiology | Admitting: Cardiology

## 2023-07-18 ENCOUNTER — Ambulatory Visit: Payer: Medicare Other | Admitting: Family Medicine

## 2023-07-18 DIAGNOSIS — Z9861 Coronary angioplasty status: Secondary | ICD-10-CM

## 2023-07-18 NOTE — Progress Notes (Signed)
 Daily Session Note  Patient Details  Name: JENNYE RUNQUIST MRN: [id number] Date of Birth: 10-17-1950 Referring Provider:   Flowsheet Row CARDIAC REHAB PHASE II ORIENTATION from 07/09/2023 in [facility] CARDIAC REHABIL[facility]  Referring Provider Truett Mainland MD       Encounter Date: 07/18/2023  Check In:  Session Check In - 07/18/23 8657       Check-In   Supervising physician immediately available to respond to emergencies See telemetry face sheet for immediately available MD    Location [facility]    Staff Present Fabio Pierce, MA, RCEP, CCRP, Dow Adolph, RN, BSN;Heather Fredric Mare, [place], Exercise Physiologist    Virtual Visit No    Medication changes reported     No    Fall or balance concerns reported    No    Warm-up and Cool-down Performed on first and last piece of equipment    Resistance Training Performed Yes    VAD Patient? No    PAD/SET Patient? No      Pain Assessment   Currently in Pain? No/denies             Capillary Blood Glucose: No results found for this or any previous visit (from the past 24 hours).    Social History   Tobacco Use  Smoking Status Every Day   Current packs/day: 0.01   Average packs/day: (0.6 ttl pk-yrs)   Types: Cigarettes   Start date: 08/10/1964  Smokeless Tobacco Never  Tobacco Comments   She is weaning herself down.     Goals Met:  Independence with exercise equipment Exercise tolerated well No report of concerns or symptoms today Strength training completed today  Goals Unmet:  Not Applicable  Comments: Pt able to follow exercise prescription today without complaint.  Will continue to monitor for progression.

## 2023-07-23 ENCOUNTER — Encounter (HOSPITAL_COMMUNITY): Payer: Self-pay | Admitting: *Deleted

## 2023-07-23 DIAGNOSIS — Z9861 Coronary angioplasty status: Secondary | ICD-10-CM

## 2023-07-23 NOTE — Progress Notes (Signed)
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 with locating and accessing local/national Quit Smoking programs, and support quit date choice.    Expected Outcomes Short Term: Will demonstrate readiness to quit, by selecting a quit date.;Short Term: Will quit all tobacco product use, adhering to prevention of relapse plan.;Long Term: Complete abstinence from all tobacco products for at least 12 months from quit date.    Hypertension Yes    Intervention Provide education on lifestyle modifcations including regular physical activity/exercise, weight management, moderate sodium restriction and increased consumption of fresh fruit, vegetables, and low fat dairy, alcohol moderation, and smoking cessation.;Monitor prescription use compliance.     Expected Outcomes Short Term: Continued assessment and intervention until BP is < 140/69mm HG in hypertensive participants. < 130/22mm HG in hypertensive participants with diabetes, heart failure or chronic kidney disease.;Long Term: Maintenance of blood pressure at goal levels.    Lipids Yes    Intervention Provide education and support for participant on nutrition & aerobic/resistive exercise along with prescribed medications to achieve LDL 70mg , HDL >40mg .    Expected Outcomes Short Term: Participant states understanding of desired cholesterol values and is compliant with medications prescribed. Participant is following exercise prescription and nutrition guidelines.;Long Term: Cholesterol controlled with medications as prescribed, with individualized exercise RX and with personalized nutrition plan. Value goals: LDL < 70mg , HDL > 40 mg.  Core Components/Risk Factors/Patient Goals Review:   Goals and Risk Factor Review     Row Name 07/02/23 1335             Core Components/Risk Factors/Patient Goals Review   Personal Goals Review Tobacco Cessation       Review Lequita is a current tobacco user. Intervention for tobacco cessation was provided at the initial medical review. She was asked about readiness to quit and reported that she is ready to quit once finds right method for her. She is not ready to set quit date yet.  Patient was advised and educated about tobacco cessation using combination therapy, tobacco cessation classes, quit line, and quit smoking apps. Patient demonstrated understanding of this material. Staff will continue to provide encouragement and follow up with the patient throughout the program.  Packet given to patient at orientation.       Expected Outcomes Short: set quit date Long: Complete cessation                Core Components/Risk Factors/Patient Goals at Discharge (Final Review):   Goals and Risk Factor Review - 07/02/23 1335       Core  Components/Risk Factors/Patient Goals Review   Personal Goals Review Tobacco Cessation    Review Keshonda is a current tobacco user. Intervention for tobacco cessation was provided at the initial medical review. She was asked about readiness to quit and reported that she is ready to quit once finds right method for her. She is not ready to set quit date yet.  Patient was advised and educated about tobacco cessation using combination therapy, tobacco cessation classes, quit line, and quit smoking apps. Patient demonstrated understanding of this material. Staff will continue to provide encouragement and follow up with the patient throughout the program.  Packet given to patient at orientation.    Expected Outcomes Short: set quit date Long: Complete cessation             ITP Comments:  ITP Comments     Row Name 07/02/23 1326 07/10/23 1501 07/23/23 0837       ITP Comments Completed virtual orientation today.  EP evaluation is scheduled for 07/09/23 at 1415 .  Documentation for diagnosis can be found in [facility] encounter cath report 05/30/23 and OV 06/27/23. First full day of exercise!  Patient was oriented to gym and equipment including functions, settings, policies, and procedures.  Patient's individual exercise prescription and treatment plan were reviewed.  All starting workloads were established based on the results of the 6 minute walk test done at initial orientation visit.  The plan for exercise progression was also introduced and progression will be customized based on patient's performance and goals. 30 day review completed. ITP sent to Dr. Dina Rich, Medical Director of Cardiac Rehab. Continue with ITP unless changes are made by physician.    Newer to program              Comments: 30 day review

## 2023-07-24 ENCOUNTER — Encounter (HOSPITAL_COMMUNITY)
Admission: RE | Admit: 2023-07-24 | Discharge: 2023-07-24 | Disposition: A | Discharge status: Home / Self Care | Source: Ambulatory Visit | Attending: Cardiology | Admitting: Cardiology

## 2023-07-24 DIAGNOSIS — Z9861 Coronary angioplasty status: Secondary | ICD-10-CM

## 2023-07-24 NOTE — Progress Notes (Signed)
 Daily Session Note  Patient Details  Name: Stacy Douglas MRN: [id number] Date of Birth: 27-Nov-1950 Referring Provider:   Flowsheet Row CARDIAC REHAB PHASE II ORIENTATION from 07/09/2023 in [facility] CARDIAC REHABILITATION  Referring Provider Truett Mainland MD       Encounter Date: 07/24/2023  Check In:  Session Check In - 07/24/23 1028       Check-In   Supervising physician immediately available to respond to emergencies See telemetry face sheet for immediately available ER MD    Location [facility]    Staff Present Fabio Pierce, MA, RCEP, CCRP, Dow Adolph, RN, BSN;Heather Fredric Mare, BS, Exercise Physiologist    Virtual Visit No    Medication changes reported     No    Fall or balance concerns reported    No    Tobacco Cessation No Change    Current number of cigarettes/nicotine per day     3    Warm-up and Cool-down Performed on first and last piece of equipment    Resistance Training Performed Yes    VAD Patient? No    PAD/SET Patient? No      Pain Assessment   Currently in Pain? No/denies    Multiple Pain Sites No             Capillary Blood Glucose: No results found for this or any previous visit (from the past 24 hours).    Social History   Tobacco Use  Smoking Status Every Day   Current packs/day: 0.01   Average packs/day: (0.6 ttl pk-yrs)   Types: Cigarettes   Start date: 08/10/1964  Smokeless Tobacco Never  Tobacco Comments   She is weaning herself down.     Goals Met:  Independence with exercise equipment Exercise tolerated well No report of concerns or symptoms today Strength training completed today  Goals Unmet:  Not Applicable  Comments: Pt able to follow exercise prescription today without complaint.  Will continue to monitor for progression.

## 2023-07-25 ENCOUNTER — Encounter (HOSPITAL_COMMUNITY)
Admission: RE | Admit: 2023-07-25 | Discharge: 2023-07-25 | Disposition: A | Discharge status: Home / Self Care | Source: Ambulatory Visit | Attending: Cardiology | Admitting: Cardiology

## 2023-07-25 DIAGNOSIS — Z9861 Coronary angioplasty status: Secondary | ICD-10-CM

## 2023-07-25 NOTE — Progress Notes (Signed)
 Daily Session Note  Patient Details  Name: Stacy Douglas MRN: [id number] Date of Birth: January 17, 1951 Referring Provider:   Flowsheet Row CARDIAC REHAB PHASE II ORIENTATION from 07/09/2023 in [facility] CARDIAC REHABILITATION  Referring Provider Truett Mainland MD       Encounter Date: 07/25/2023  Check In:  Session Check In - 07/25/23 0910       Check-In   Supervising physician immediately available to respond to emergencies See telemetry face sheet for immediately available MD    Location [facility]    Staff Present Rodena Medin, RN, BSN;Heather Fredric Mare, BS, Exercise Physiologist;Tina Jake Shark, RN    Virtual Visit No    Medication changes reported     No    Fall or balance concerns reported    No    Tobacco Cessation No Change    Current number of cigarettes/nicotine per day     3    Warm-up and Cool-down Performed on first and last piece of equipment    Resistance Training Performed Yes    VAD Patient? No    PAD/SET Patient? No      Pain Assessment   Currently in Pain? No/denies    Multiple Pain Sites No             Capillary Blood Glucose: No results found for this or any previous visit (from the past 24 hours).    Social History   Tobacco Use  Smoking Status Every Day   Current packs/day: 0.01   Average packs/day: (0.6 ttl pk-yrs)   Types: Cigarettes   Start date: 08/10/1964  Smokeless Tobacco Never  Tobacco Comments   She is weaning herself down.     Goals Met:  Independence with exercise equipment Exercise tolerated well No report of concerns or symptoms today Strength training completed today  Goals Unmet:  Not Applicable  Comments: Pt able to follow exercise prescription today without complaint.  Will continue to monitor for progression.

## 2023-07-29 ENCOUNTER — Ambulatory Visit: Payer: BC Managed Care – PPO | Attending: Cardiology | Admitting: Cardiology

## 2023-07-29 ENCOUNTER — Encounter: Payer: Self-pay | Admitting: Cardiology

## 2023-07-29 VITALS — BP 134/80 | HR 93 | Ht 62.0 in | Wt 210.4 lb

## 2023-07-29 DIAGNOSIS — M791 Myalgia, unspecified site: Secondary | ICD-10-CM | POA: Diagnosis not present

## 2023-07-29 DIAGNOSIS — E782 Mixed hyperlipidemia: Secondary | ICD-10-CM

## 2023-07-29 DIAGNOSIS — I25119 Atherosclerotic heart disease of native coronary artery with unspecified angina pectoris: Secondary | ICD-10-CM | POA: Diagnosis not present

## 2023-07-29 DIAGNOSIS — Z79899 Other long term (current) drug therapy: Secondary | ICD-10-CM | POA: Diagnosis not present

## 2023-07-29 DIAGNOSIS — T466X5D Adverse effect of antihyperlipidemic and antiarteriosclerotic drugs, subsequent encounter: Secondary | ICD-10-CM

## 2023-07-29 MED ORDER — METOPROLOL SUCCINATE ER 25 MG PO TB24: 25.0000 mg | ORAL_TABLET | Freq: Every day | ORAL | 2 refills | Status: AC

## 2023-07-29 NOTE — Progress Notes (Signed)
 Cardiology Office Note  Date: 07/29/2023   ID: Stacy Douglas, DOB [birth year], MRN [id number]  History of Present Illness: Stacy Douglas is a 73 y.o. female last seen in February by [name], I reviewed the note.  I also reviewed interval chart and cardiac testing since our last encounter.  She underwent follow-up cardiac catheterization in January demonstrating 75% in-stent restenosis within the proximal to mid LAD followed by 75% stenosis.  Otherwise moderate circumflex disease with patent stent site and more distal 75% stenosis.  Ramus intermedius with 80% stenosis.  Distal RCA with 60 to 80% diffuse stenosis.  Overall small vessels.  She did undergo angioplasty of the proximal to mid LAD, case reviewed in cath conference with involvement of TCTS and decision was to continue medical therapy rather than pursue CABG.  She is here for a follow-up visit, overall doing well without increasing angina.  She did not tolerate trial of Imdur, states that she felt nauseous and had to stop the medication.  She is tolerating cardiac rehab.  Has had some left lower leg edema with lower extremity venous Dopplers planned by PCP.  She has a remote history of DVT.  We went over her medications.  She is just now starting on Repatha.  Physical Exam: VS:  BP 134/80   Pulse 93   Ht 5\' 2"  (1.575 m)   Wt 210 lb 6.4 oz (95.4 kg)   SpO2 96%   BMI 38.48 kg/m , BMI Body mass index is 38.48 kg/m.  Wt Readings from Last 3 Encounters:  07/29/23 210 lb 6.4 oz (95.4 kg)  07/15/23 208 lb (94.3 kg)  07/09/23 210 lb 8.6 oz (95.5 kg)    General: Patient appears comfortable at rest. HEENT: Conjunctiva and lids normal. Neck: Supple, no elevated JVP or carotid bruits. Lungs: Clear to auscultation, nonlabored breathing at rest. Cardiac: Regular rate and rhythm, no S3 or significant systolic murmur, no pericardial rub. Extremities: Left lower leg edema.  ECG:  An ECG dated 05/30/2023 was personally reviewed today  and demonstrated:  Sinus rhythm.  Labwork: 05/21/2023: ALT 15; AST 18 05/31/2023: BUN 13; Creatinine, Ser 0.91; Hemoglobin 13.8; Platelets 262; Potassium 4.0; Sodium 140 06/27/2023: Magnesium 2.2; TSH 1.500  February 2025: LP(a) 117.8  Other Studies Reviewed Today:  Echocardiogram 06/11/2023:  1. Left ventricular ejection fraction, by estimation, is 65 to 70%. The  left ventricle has normal function. The left ventricle has no regional  wall motion abnormalities. Left ventricular diastolic parameters are  consistent with Grade I diastolic  dysfunction (impaired relaxation). The global longitudinal strain is  indeterminate.   2. Right ventricular systolic function is normal. The right ventricular  size is normal.   3. The mitral valve is normal in structure. No evidence of mitral valve  regurgitation. No evidence of mitral stenosis.   4. The aortic valve is tricuspid. Aortic valve regurgitation is mild. No  aortic stenosis is present.   5. The inferior vena cava is normal in size with greater than 50%  respiratory variability, suggesting right atrial pressure of 3 mmHg.   Assessment and Plan:  1.  CAD status post BMS to the LAD and PTCA of the circumflex in 2011, DES to the proximal and mid RCA in 2016, and DES to the mid circumflex in 2019.  Cardiac catheterization in January demonstrates multivessel disease with relatively small caliber vessels and plan for medical therapy, CABG was not recommended as initial management strategy.  She did undergo angioplasty  of the proximal to mid LAD for treatment of in-stent restenosis.  At this point stable in terms of angina control and tolerating cardiac rehab.  She did not tolerate Imdur as discussed above.  Continue aspirin 81 mg daily, Effient 10 mg daily, Toprol-XL 25 mg daily, and as needed nitroglycerin.  She is starting on Repatha as well.   2.  Primary hypertension.  No changes made to current regimen.   3.  Mixed hyperlipidemia.  She has  history of statin myalgias with both Crestor and Lipitor.  Last LDL was 137 in March 2023.  Starting Repatha 140 mg/mL every 14 days.  We will plan to recheck FLP in about 3 months.  4.  Moderate bilateral ICA stenosis, 40 to 59% RICA and LICA by Dopplers in March.  Continue antiplatelet therapy, starting Repatha.  Disposition:  Follow up  3 months.  Signed, Jonelle Sidle, M.D., F.A.C.C. [facility]

## 2023-07-29 NOTE — Patient Instructions (Addendum)
 Medication Instructions:  Your physician recommends that you continue on your current medications as directed. Please refer to the Current Medication list given to you today.  Labwork: Your physician recommends that you return for a FASTING lipid profile: in 3 months just before your next visit. Please do not eat or drink for at least 8 hours when you have this done. You may take your medications that morning with a sip of water. This can be done at your family doctor's office but you will need to schedule an appointment with the Lab.   Testing/Procedures: none  Follow-Up: Your physician recommends that you schedule a follow-up appointment in: 3 months  Any Other Special Instructions Will Be Listed Below (If Applicable).  If you need a refill on your cardiac medications before your next appointment, please call your pharmacy.

## 2023-07-30 DIAGNOSIS — R55 Syncope and collapse: Secondary | ICD-10-CM

## 2023-07-31 ENCOUNTER — Ambulatory Visit: Attending: Family Medicine

## 2023-07-31 ENCOUNTER — Encounter (HOSPITAL_COMMUNITY)

## 2023-07-31 DIAGNOSIS — R600 Localized edema: Secondary | ICD-10-CM

## 2023-07-31 NOTE — Progress Notes (Signed)
 Negative for DVT

## 2023-08-01 ENCOUNTER — Encounter (HOSPITAL_COMMUNITY)
Admission: RE | Admit: 2023-08-01 | Discharge: 2023-08-01 | Disposition: A | Discharge status: Home / Self Care | Source: Ambulatory Visit | Attending: Cardiology | Admitting: Cardiology

## 2023-08-01 DIAGNOSIS — Z9861 Coronary angioplasty status: Secondary | ICD-10-CM | POA: Insufficient documentation

## 2023-08-01 NOTE — Progress Notes (Signed)
 Daily Session Note  Patient Details  Name: Stacy Douglas MRN: [id number] Date of Birth: [birth year] Referring Provider:   Flowsheet Row CARDIAC REHAB PHASE II ORIENTATION from 07/09/2023 in [facility] CARDIAC REHABILITATION  Referring Provider Truett Mainland MD       Encounter Date: 08/01/2023  Check In:  Session Check In - 08/01/23 2595       Check-In   Supervising physician immediately available to respond to emergencies See telemetry face sheet for immediately available MD    Location [facility]    Staff Present Terrance Mass, RN;Brooke Elwyn Reach, RN;Debra Laural Benes, RN, BSN    Virtual Visit No    Medication changes reported     No    Fall or balance concerns reported    No    Tobacco Cessation No Change    Warm-up and Cool-down Performed on first and last piece of equipment    Resistance Training Performed Yes    VAD Patient? No    PAD/SET Patient? No      Pain Assessment   Multiple Pain Sites No             Capillary Blood Glucose: No results found for this or any previous visit (from the past 24 hours).    Social History   Tobacco Use  Smoking Status Every Day   Current packs/day: 0.01   Average packs/day: (0.6 ttl pk-yrs)   Types: Cigarettes   Start date: 08/10/1964  Smokeless Tobacco Never  Tobacco Comments   She is weaning herself down.     Goals Met:  Independence with exercise equipment Exercise tolerated well Strength training completed today  Goals Unmet:  Not Applicable  Comments: Pt able to follow exercise prescription today without complaint.  Will continue to monitor for progression.

## 2023-08-07 ENCOUNTER — Encounter (HOSPITAL_COMMUNITY)
Admission: RE | Admit: 2023-08-07 | Discharge: 2023-08-07 | Disposition: A | Discharge status: Home / Self Care | Source: Ambulatory Visit | Attending: Cardiology | Admitting: Cardiology

## 2023-08-07 DIAGNOSIS — Z9861 Coronary angioplasty status: Secondary | ICD-10-CM | POA: Diagnosis not present

## 2023-08-07 NOTE — Progress Notes (Signed)
 Daily Session Note  Patient Details  Name: Stacy Douglas MRN: [id number] Date of Birth: 03-Apr-1951 Referring Provider:   Flowsheet Row CARDIAC REHAB PHASE II ORIENTATION from 07/09/2023 in [facility] CARDIAC REHABILITATION  Referring Provider Truett Mainland MD       Encounter Date: 08/07/2023  Check In:  Session Check In - 08/07/23 1015       Check-In   Supervising physician immediately available to respond to emergencies Stacy telemetry face sheet for immediately available MD    Location [facility]    Staff Present Ross Ludwig, BS, Exercise Physiologist;Mazelle Huebert Laural Benes, RN, BSN    Virtual Visit No    Medication changes reported     No    Fall or balance concerns reported    No    Tobacco Cessation No Change    Current number of cigarettes/nicotine per day     3    Warm-up and Cool-down Performed on first and last piece of equipment    Resistance Training Performed Yes    VAD Patient? No    PAD/SET Patient? No      Pain Assessment   Currently in Pain? No/denies    Multiple Pain Sites No             Capillary Blood Glucose: No results found for this or any previous visit (from the past 24 hours).    Social History   Tobacco Use  Smoking Status Every Day   Current packs/day: 0.01   Average packs/day: (0.6 ttl pk-yrs)   Types: Cigarettes   Start date: 08/10/1964  Smokeless Tobacco Never  Tobacco Comments   She is weaning herself down.     Goals Met:  Independence with exercise equipment Exercise tolerated well No report of concerns or symptoms today Strength training completed today  Goals Unmet:  Not Applicable  Comments: Pt able to follow exercise prescription today without complaint.  Will continue to monitor for progression.

## 2023-08-08 ENCOUNTER — Encounter (HOSPITAL_COMMUNITY)

## 2023-08-14 ENCOUNTER — Encounter (HOSPITAL_COMMUNITY)

## 2023-08-15 ENCOUNTER — Encounter (HOSPITAL_COMMUNITY)

## 2023-08-20 ENCOUNTER — Encounter (HOSPITAL_COMMUNITY): Payer: Self-pay | Admitting: *Deleted

## 2023-08-20 DIAGNOSIS — Z9861 Coronary angioplasty status: Secondary | ICD-10-CM

## 2023-08-20 NOTE — Progress Notes (Signed)
 This encounter was created in error - please disregard.

## 2023-08-20 NOTE — Progress Notes (Signed)
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    Expected Outcomes Short Term: Will demonstrate readiness to quit, by selecting a quit date.;Short Term: Will quit all tobacco product use, adhering to prevention of relapse plan.;Long Term: Complete abstinence from all tobacco products for at least 12 months from quit date.    Hypertension Yes    Intervention Provide education on lifestyle modifcations including regular physical activity/exercise, weight management, moderate sodium restriction and increased consumption of fresh fruit, vegetables, and low fat dairy, alcohol moderation, and smoking cessation.;Monitor prescription use compliance.    Expected Outcomes Short Term: Continued assessment and intervention until BP is < 140/59mm HG in  hypertensive participants. < 130/90mm HG in hypertensive participants with diabetes, heart failure or chronic kidney disease.;Long Term: Maintenance of blood pressure at goal levels.    Lipids Yes    Intervention Provide education and support for participant on nutrition & aerobic/resistive exercise along with prescribed medications to achieve LDL 70mg , HDL >40mg .    Expected Outcomes Short Term: Participant states understanding of desired cholesterol values and is compliant with medications prescribed. Participant is following exercise prescription and nutrition guidelines.;Long Term: Cholesterol controlled with medications as prescribed, with individualized exercise RX and with personalized nutrition plan. Value goals: LDL < 70mg , HDL > 40 mg.             Core Components/Risk Factors/Patient Goals Review:   Goals and Risk Factor Review     Row Name 07/02/23 1335  Core Components/Risk Factors/Patient Goals Review   Personal Goals Review Tobacco Cessation       Review Stacy Douglas is a current tobacco user. Intervention for tobacco cessation was provided at the initial medical review. She was asked about readiness to quit and reported that she is ready to quit once finds right method for her. She is not ready to set quit date yet.  Patient was advised and educated about tobacco cessation using combination therapy, tobacco cessation classes, quit line, and quit smoking apps. Patient demonstrated understanding of this material. Staff will continue to provide encouragement and follow up with the patient throughout the program.  Packet given to patient at orientation.       Expected Outcomes Short: set quit date Long: Complete cessation                Core Components/Risk Factors/Patient Goals at Discharge (Final Review):   Goals and Risk Factor Review - 07/02/23 1335       Core Components/Risk Factors/Patient Goals Review   Personal Goals Review Tobacco Cessation    Review Stacy Douglas  is a current tobacco user. Intervention for tobacco cessation was provided at the initial medical review. She was asked about readiness to quit and reported that she is ready to quit once finds right method for her. She is not ready to set quit date yet.  Patient was advised and educated about tobacco cessation using combination therapy, tobacco cessation classes, quit line, and quit smoking apps. Patient demonstrated understanding of this material. Staff will continue to provide encouragement and follow up with the patient throughout the program.  Packet given to patient at orientation.    Expected Outcomes Short: set quit date Long: Complete cessation             ITP Comments:  ITP Comments     Row Name 07/02/23 1326 07/10/23 1501 07/23/23 0837 08/20/23 0839     ITP Comments Completed virtual orientation today.  EP evaluation is scheduled for 07/09/23 at 1415 .  Documentation for diagnosis can be found in [facility] encounter cath report 05/30/23 and OV 06/27/23. First full day of exercise!  Patient was oriented to gym and equipment including functions, settings, policies, and procedures.  Patient's individual exercise prescription and treatment plan were reviewed.  All starting workloads were established based on the results of the 6 minute walk test done at initial orientation visit.  The plan for exercise progression was also introduced and progression will be customized based on patient's performance and goals. 30 day review completed. ITP sent to Dr. Armida Lander, Medical Director of Cardiac Rehab. Continue with ITP unless changes are made by physician.    Newer to program 30 day review completed. ITP sent to Dr. Armida Lander, Medical Director of Cardiac Rehab. Continue with ITP unless changes are made by physician.    Stacy Douglas has had spotty attendance this month, last attended on 4/10, unable to assess for goals.             Comments: 30 day review

## 2023-08-21 ENCOUNTER — Encounter (HOSPITAL_COMMUNITY)

## 2023-08-22 ENCOUNTER — Encounter (HOSPITAL_COMMUNITY)

## 2023-08-28 ENCOUNTER — Encounter (HOSPITAL_COMMUNITY): Attending: Cardiology

## 2023-08-28 DIAGNOSIS — Z9861 Coronary angioplasty status: Secondary | ICD-10-CM | POA: Insufficient documentation

## 2023-08-29 ENCOUNTER — Encounter (HOSPITAL_COMMUNITY)

## 2023-09-04 ENCOUNTER — Encounter (HOSPITAL_COMMUNITY)

## 2023-09-05 ENCOUNTER — Encounter (HOSPITAL_COMMUNITY)

## 2023-09-09 ENCOUNTER — Telehealth (HOSPITAL_COMMUNITY): Payer: Self-pay | Admitting: *Deleted

## 2023-09-09 NOTE — Telephone Encounter (Signed)
Called to check on patient, left message.

## 2023-09-11 ENCOUNTER — Encounter (HOSPITAL_COMMUNITY)

## 2023-09-12 ENCOUNTER — Encounter (HOSPITAL_COMMUNITY)

## 2023-09-17 ENCOUNTER — Encounter (HOSPITAL_COMMUNITY): Payer: Self-pay | Admitting: *Deleted

## 2023-09-17 DIAGNOSIS — Z9861 Coronary angioplasty status: Secondary | ICD-10-CM

## 2023-09-17 NOTE — Progress Notes (Signed)
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    Expected Outcomes Short Term: Will demonstrate readiness to quit, by selecting a quit date.;Short Term: Will quit all tobacco product use, adhering to prevention of relapse plan.;Long Term: Complete abstinence from all tobacco products for at least 12 months from quit date.    Hypertension Yes    Intervention Provide education on lifestyle modifcations including regular physical activity/exercise, weight management, moderate sodium restriction and increased consumption of fresh fruit, vegetables, and low fat dairy, alcohol moderation, and smoking cessation.;Monitor prescription use compliance.    Expected Outcomes Short Term: Continued assessment and intervention until BP is < 140/54mm HG in  hypertensive participants. < 130/63mm HG in hypertensive participants with diabetes, heart failure or chronic kidney disease.;Long Term: Maintenance of blood pressure at goal levels.    Lipids Yes    Intervention Provide education and support for participant on nutrition & aerobic/resistive exercise along with prescribed medications to achieve LDL 70mg , HDL >40mg .    Expected Outcomes Short Term: Participant states understanding of desired cholesterol values and is compliant with medications prescribed. Participant is following exercise prescription and nutrition guidelines.;Long Term: Cholesterol controlled with medications as prescribed, with individualized exercise RX and with personalized nutrition plan. Value goals: LDL < 70mg , HDL > 40 mg.             Core Components/Risk Factors/Patient Goals Review:   Goals and Risk Factor Review     Row Name 07/02/23 1335  Core Components/Risk Factors/Patient Goals Review   Personal Goals Review Tobacco Cessation       Review Stacy Douglas is a current tobacco user. Intervention for tobacco cessation was provided at the initial medical review. She was asked about readiness to quit and reported that she is ready to quit once finds right method for her. She is not ready to set quit date yet.  Patient was advised and educated about tobacco cessation using combination therapy, tobacco cessation classes, quit line, and quit smoking apps. Patient demonstrated understanding of this material. Staff will continue to provide encouragement and follow up with the patient throughout the program.  Packet given to patient at orientation.       Expected Outcomes Short: set quit date Long: Complete cessation                Core Components/Risk Factors/Patient Goals at Discharge (Final Review):   Goals and Risk Factor Review - 07/02/23 1335       Core Components/Risk Factors/Patient Goals Review   Personal Goals Review Tobacco Cessation    Review Stacy Douglas  is a current tobacco user. Intervention for tobacco cessation was provided at the initial medical review. She was asked about readiness to quit and reported that she is ready to quit once finds right method for her. She is not ready to set quit date yet.  Patient was advised and educated about tobacco cessation using combination therapy, tobacco cessation classes, quit line, and quit smoking apps. Patient demonstrated understanding of this material. Staff will continue to provide encouragement and follow up with the patient throughout the program.  Packet given to patient at orientation.    Expected Outcomes Short: set quit date Long: Complete cessation             ITP Comments:  ITP Comments     Row Name 07/02/23 1326 07/10/23 1501 07/23/23 0837 08/20/23 0839 09/17/23 0830   ITP Comments Completed virtual orientation today.  EP evaluation is scheduled for 07/09/23 at 1415 .  Documentation for diagnosis can be found in [facility] encounter cath report 05/30/23 and OV 06/27/23. First full day of exercise!  Patient was oriented to gym and equipment including functions, settings, policies, and procedures.  Patient's individual exercise prescription and treatment plan were reviewed.  All starting workloads were established based on the results of the 6 minute walk test done at initial orientation visit.  The plan for exercise progression was also introduced and progression will be customized based on patient's performance and goals. 30 day review completed. ITP sent to Dr. Armida Lander, Medical Director of Cardiac Rehab. Continue with ITP unless changes are made by physician.    Newer to program 30 day review completed. ITP sent to Dr. Armida Lander, Medical Director of Cardiac Rehab. Continue with ITP unless changes are made by physician.    Stacy Douglas has had spotty attendance this month, last attended on 4/10, unable to assess for goals. 30 day review completed. ITP sent to Dr. Armida Lander, Medical Director of  Cardiac Rehab. Continue with ITP unless changes are made by physician.   Currently out on hold for insurance. Unable to assess for goals. Last attended 08/07/23.            Comments: 30 day review

## 2023-09-18 ENCOUNTER — Encounter (HOSPITAL_COMMUNITY)

## 2023-09-19 ENCOUNTER — Encounter (HOSPITAL_COMMUNITY)

## 2023-09-25 ENCOUNTER — Encounter (HOSPITAL_COMMUNITY)

## 2023-09-26 ENCOUNTER — Encounter (HOSPITAL_COMMUNITY)

## 2023-09-30 DIAGNOSIS — H43811 Vitreous degeneration, right eye: Secondary | ICD-10-CM | POA: Diagnosis not present

## 2023-10-02 ENCOUNTER — Encounter (HOSPITAL_COMMUNITY)

## 2023-10-02 ENCOUNTER — Other Ambulatory Visit: Payer: Self-pay | Admitting: Medical

## 2023-10-02 DIAGNOSIS — Z9861 Coronary angioplasty status: Secondary | ICD-10-CM | POA: Insufficient documentation

## 2023-10-02 DIAGNOSIS — I25110 Atherosclerotic heart disease of native coronary artery with unstable angina pectoris: Secondary | ICD-10-CM

## 2023-10-03 ENCOUNTER — Encounter (HOSPITAL_COMMUNITY)

## 2023-10-03 ENCOUNTER — Encounter (HOSPITAL_COMMUNITY): Payer: Self-pay | Admitting: *Deleted

## 2023-10-03 ENCOUNTER — Telehealth (HOSPITAL_COMMUNITY): Payer: Self-pay | Admitting: *Deleted

## 2023-10-03 DIAGNOSIS — Z9861 Coronary angioplasty status: Secondary | ICD-10-CM

## 2023-10-03 NOTE — Telephone Encounter (Signed)
 Called to check on patient and update for insurance concerns.  Left message.

## 2023-10-09 ENCOUNTER — Encounter (HOSPITAL_COMMUNITY)

## 2023-10-10 ENCOUNTER — Encounter (HOSPITAL_COMMUNITY)

## 2023-10-15 NOTE — Progress Notes (Signed)
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 adhering to prevention of relapse plan.;Long Term: Complete abstinence from all tobacco products for at least 12 months from quit date.    Hypertension Yes    Intervention Provide education on lifestyle modifcations including regular physical activity/exercise, weight management, moderate sodium restriction and increased consumption of fresh fruit, vegetables, and low fat dairy, alcohol moderation, and smoking cessation.;Monitor prescription use compliance.    Expected Outcomes Short Term: Continued assessment and intervention until BP is < 140/36mm HG in hypertensive participants. < 130/42mm HG in hypertensive participants with diabetes, heart failure or chronic kidney disease.;Long Term: Maintenance of blood pressure at goal levels.    Lipids Yes    Intervention  Provide education and support for participant on nutrition & aerobic/resistive exercise along with prescribed medications to achieve LDL 70mg , HDL >40mg .    Expected Outcomes Short Term: Participant states understanding of desired cholesterol values and is compliant with medications prescribed. Participant is following exercise prescription and nutrition guidelines.;Long Term: Cholesterol controlled with medications as prescribed, with individualized exercise RX and with personalized nutrition plan. Value goals: LDL < 70mg , HDL > 40 mg.          Core Components/Risk Factors/Patient Goals Review:   Goals and Risk Factor Review     Row Name 07/02/23 1335             Core Components/Risk Factors/Patient Goals Review   Personal Goals Review Tobacco Cessation       Review Madalena is a current tobacco user. Intervention for tobacco cessation was provided at the initial medical review. She was asked about readiness to quit and reported that she is ready to quit once finds right method for her. She is not ready to set quit date yet.  Patient was advised and educated about tobacco cessation using combination therapy, tobacco cessation classes, quit line, and quit smoking apps. Patient demonstrated understanding of this material. Staff will continue to provide encouragement  and follow up with the patient throughout the program.  Packet given to patient at orientation.       Expected Outcomes Short: set quit date Long: Complete cessation          Core Components/Risk Factors/Patient Goals at Discharge (Final Review):   Goals and Risk Factor Review - 07/02/23 1335       Core Components/Risk Factors/Patient Goals Review   Personal Goals Review Tobacco Cessation    Review Mariaceleste is a current tobacco user. Intervention for tobacco cessation was provided at the initial medical review. She was asked about readiness to quit and reported that she is ready to quit once finds right method for her. She is not  ready to set quit date yet.  Patient was advised and educated about tobacco cessation using combination therapy, tobacco cessation classes, quit line, and quit smoking apps. Patient demonstrated understanding of this material. Staff will continue to provide encouragement and follow up with the patient throughout the program.  Packet given to patient at orientation.    Expected Outcomes Short: set quit date Long: Complete cessation          ITP Comments:  ITP Comments     Row Name 07/02/23 1326 07/10/23 1501 07/23/23 0837 08/20/23 0839 09/17/23 0830   ITP Comments Completed virtual orientation today.  EP evaluation is scheduled for 07/09/23 at 1415 .  Documentation for diagnosis can be found in [facility] encounter cath report 05/30/23 and OV 06/27/23. First full day of exercise!  Patient was oriented to gym and equipment including functions, settings, policies, and procedures.  Patient's individual exercise prescription and treatment plan were reviewed.  All starting workloads were established based on the results of the 6 minute walk test done at initial orientation visit.  The plan for exercise progression was also introduced and progression will be customized based on patient's performance and goals. 30 day review completed. ITP sent to Dr. Armida Lander, Medical Director of Cardiac Rehab. Continue with ITP unless changes are made by physician.    Newer to program 30 day review completed. ITP sent to Dr. Armida Lander, Medical Director of Cardiac Rehab. Continue with ITP unless changes are made by physician.    Shigeko has had spotty attendance this month, last attended on 4/10, unable to assess for goals. 30 day review completed. ITP sent to Dr. Armida Lander, Medical Director of Cardiac Rehab. Continue with ITP unless changes are made by physician.   Currently out on hold for insurance. Unable to assess for goals. Last attended 08/07/23.    Row Name 10/03/23 [phone] 10/15/23 0921         ITP Comments  Called to check on patient and update for insurance concerns.  Left message. 30 day review completed. ITP sent to Dr. Armida Lander, Medical Director of Cardiac Rehab. Continue with ITP unless changes are made by physician. Currently out on hold for insurance. Unable to assess for goals. Last attended 08/07/23.         Comments: 30 Day Review

## 2023-10-15 NOTE — Addendum Note (Signed)
 Encounter addended by: [place], RN on: 10/15/2023 9:22 AM  Actions taken: Flowsheet accepted, Clinical Note Signed

## 2023-10-16 ENCOUNTER — Encounter (HOSPITAL_COMMUNITY)

## 2023-10-17 ENCOUNTER — Encounter (HOSPITAL_COMMUNITY)

## 2023-10-21 ENCOUNTER — Ambulatory Visit (INDEPENDENT_AMBULATORY_CARE_PROVIDER_SITE_OTHER): Admitting: Family Medicine

## 2023-10-21 VITALS — BP 113/67 | HR 75 | Temp 98.0°F | Ht 62.0 in | Wt 210.0 lb

## 2023-10-21 DIAGNOSIS — Z0001 Encounter for general adult medical examination with abnormal findings: Secondary | ICD-10-CM | POA: Diagnosis not present

## 2023-10-21 DIAGNOSIS — Z1231 Encounter for screening mammogram for malignant neoplasm of breast: Secondary | ICD-10-CM | POA: Diagnosis not present

## 2023-10-21 DIAGNOSIS — R600 Localized edema: Secondary | ICD-10-CM | POA: Diagnosis not present

## 2023-10-21 DIAGNOSIS — F17200 Nicotine dependence, unspecified, uncomplicated: Secondary | ICD-10-CM | POA: Diagnosis not present

## 2023-10-21 DIAGNOSIS — Z0000 Encounter for general adult medical examination without abnormal findings: Secondary | ICD-10-CM

## 2023-10-21 DIAGNOSIS — I6523 Occlusion and stenosis of bilateral carotid arteries: Secondary | ICD-10-CM | POA: Insufficient documentation

## 2023-10-21 NOTE — Progress Notes (Signed)
 Stacy Douglas is a 73 y.o. female presents to office today for annual physical exam examination.    Concerns today include: 1. None   Occupation: works at [employer]  Marital status: no concern for STDs  Diet: decreasing calorie intake.  Trying to eat more salads  Exercise: walking some.  Substance use: tobacco use  Cigarettes - smokes 1/4 PPD  Start smoking at 73 years old  Does not desire lung cancer screening.  Last eye exam: states that she has cataract surgery planned for July 10th with [facility] care  Last dental exam: Dr. Abron in [place]  Last colonoscopy: declines  Last mammogram: would like mammogram.  Last pap smear: aged out  Refills needed today: none  Other specialists seen: cardiology  Dermatology exam:  Fasting today:  yes  Immunizations needed: Flu Vaccine: not due   Tdap Vaccine: would like to wait   - every 67yrs - (<3 lifetime doses or unknown): all wounds -- look up need for Tetanus IG - (>=3 lifetime doses): clean/minor wound if >62yrs from previous; all other wounds if >50yrs from previous Zoster Vaccine: would like to wait  (those >50yo, once) Pneumonia Vaccine: would like to wait.  (those w/ risk factors) - (<34yr) Both: Immunocompromised, cochlear implant, CSF leak, asplenic, sickle cell, Chronic Renal Failure - (<76yr) PPSV-23 only: Heart dz, lung disease, DM, tobacco abuse, alcoholism, cirrhosis/liver disease. - (>38yr): PPSV13 then PPSV23 in 6-12mths;  - (>51yr): repeat PPSV23 once if pt received prior to 73yo and 2yrs have passed  Past Medical History:  Diagnosis Date   Coronary atherosclerosis of native coronary artery    a. VF arrest in [place]  s/p BMS LAD 5/11, PTCA circumflex 6/11, LVEF 40%. b. [place]  10/2014 s/p DES to prox and mid RCA, otherwise patent LAD stent and moderate disease in the Cx, LVEF 55-65%. c. low-risk NST in 06/2017 d. 09/2017: NSTEMI with cath showing 99% mid-LCx stenosis --> treated with DES; patent stents  along LAD and RCA.    DVT (deep venous thrombosis) (HCC) 1992   LLE   Essential hypertension    History of duodenal ulcer 1960s   Mixed hyperlipidemia    Myocardial infarction (HCC) 08/2009   complicated by VF arrest   NSTEMI (non-ST elevated myocardial infarction) (HCC) 10/07/2017   thelbert 10/07/2017   Obesity    Plavix  resistance    Tobacco abuse    Social History   Socioeconomic History   Marital status: Divorced    Spouse name: Not on file   Number of children: Not on file   Years of education: Not on file   Highest education level: 12th grade  Occupational History   Occupation: [employer]  Tobacco Use   Smoking status: Every Day    Current packs/day: 0.01    Average packs/day: (0.6 ttl pk-yrs)    Types: Cigarettes    Start date: 08/10/1964   Smokeless tobacco: Never   Tobacco comments:    She is weaning herself down.   Vaping Use   Vaping status: Never Used  Substance and Sexual Activity   Alcohol use: Not Currently   Drug use: Not Currently   Sexual activity: Not Currently  Other Topics Concern   Not on file  Social History Narrative   Not on file   Social Drivers of Health   Financial Resource Strain: Low Risk  (10/21/2023)   Overall Financial Resource Strain (CARDIA)    Difficulty of Paying Living Expenses: Not hard at all  Food Insecurity: No Food Insecurity (10/21/2023)   Hunger Vital Sign    Worried About Running Out of Food in the Last Year: Never true    Ran Out of Food in the Last Year: Never true  Transportation Needs: No Transportation Needs (10/21/2023)   PRAPARE - Administrator, Civil Service (Medical): No    Lack of Transportation (Non-Medical): No  Physical Activity: Insufficiently Active (10/21/2023)   Exercise Vital Sign    Days of Exercise per Week: 2 days    Minutes of Exercise per Session: 20 min  Stress: No Stress Concern Present (10/21/2023)   [employer] of Occupational Health - Occupational Stress Questionnaire     Feeling of Stress: Only a little  Social Connections: Unknown (10/21/2023)   Social Connection and Isolation Panel    Frequency of Communication with Friends and Family: Not on file    Frequency of Social Gatherings with Friends and Family: Once a week    Attends Religious Services: More than 4 times per year    Active Member of Clubs or Organizations: Yes    Attends Banker Meetings: More than 4 times per year    Marital Status: Divorced  Intimate Partner Violence: Not At Risk (05/30/2023)   Humiliation, Afraid, Rape, and Kick questionnaire    Fear of Current or Ex-Partner: No    Emotionally Abused: No    Physically Abused: No    Sexually Abused: No   Past Surgical History:  Procedure Laterality Date   APPENDECTOMY     BREAST BIOPSY Right    CARDIAC CATHETERIZATION N/A 11/16/2014   Procedure: Left Heart Cath and Coronary Angiography;  Surgeon: Deatrice DELENA Cage, MD;  Location: [facility] INVASIVE CV LAB;  Service: Cardiovascular;  Laterality: N/A;   CARDIAC CATHETERIZATION N/A 11/16/2014   Procedure: Coronary Stent Intervention;  Surgeon: Deatrice DELENA Cage, MD;  Location: [facility] INVASIVE CV LAB;  Service: Cardiovascular;  Laterality: N/A;   CATARACT EXTRACTION W/ INTRAOCULAR LENS IMPLANT Right 1990s   CORONARY ANGIOPLASTY WITH STENT PLACEMENT  2011   CORONARY ANGIOPLASTY WITH STENT PLACEMENT  10/07/2017   CORONARY PRESSURE/FFR STUDY N/A 05/30/2023   Procedure: CORONARY PRESSURE/FFR STUDY;  Surgeon: Elmira Newman PARAS, MD;  Location: [facility]VASIVE CV [facility];  Service: Cardiovascular;  Laterality: N/A;   CORONARY STENT INTERVENTION N/A 10/07/2017   Procedure: CORONARY STENT INTERVENTION;  Surgeon: Swaziland, Peter M, MD;  Location: [facility] INVASIVE CV LAB;  Service: Cardiovascular;  Laterality: N/A;   LEFT HEART CATH AND CORONARY ANGIOGRAPHY N/A 10/07/2017   Procedure: LEFT HEART CATH AND CORONARY ANGIOGRAPHY;  Surgeon: Swaziland, Peter M, MD;  Location: [facility] INVASIVE CV LAB;  Service: Cardiovascular;   Laterality: N/A;   LEFT HEART CATH AND CORONARY ANGIOGRAPHY N/A 05/30/2023   Procedure: LEFT HEART CATH AND CORONARY ANGIOGRAPHY;  Surgeon: Elmira Newman PARAS, MD;  Location: [facility] INVASIVE CV [facility];  Service: Cardiovascular;  Laterality: N/A;   MASS EXCISION  1980s   tooks lumps off my neck; front and back   Family History  Problem Relation Age of Onset   Coronary artery disease Father        MI age 48   Diverticulitis Mother     Current Outpatient Medications:    aspirin  EC 81 MG tablet, Take 81 mg by mouth daily., Disp: , Rfl:    Evolocumab  (REPATHA  SURECLICK) 140 MG/ML SOAJ, Inject 140 mg into the skin every 14 (fourteen) days., Disp: 2 mL, Rfl: 11   metoprolol  succinate (TOPROL -XL) 25 MG 24  hr tablet, Take 1 tablet (25 mg total) by mouth daily., Disp: 90 tablet, Rfl: 2   nitroGLYCERIN  (NITROSTAT ) 0.4 MG SL tablet, Place 1 tablet (0.4 mg total) under the tongue every 5 (five) minutes x 3 doses as needed for chest pain (if no relief after 2nd dose, proceed to ED or call 911)., Disp: 12 tablet, Rfl: 2   prasugrel  (EFFIENT ) 10 MG TABS tablet, Take 1 tablet by mouth once daily, Disp: 90 tablet, Rfl: 3  Allergies  Allergen Reactions   Crestor  [Rosuvastatin  Calcium ] Other (See Comments)    Myalgias   Plavix  [Clopidogrel  Bisulfate]     Nonresponder to Plavix    Codeine Nausea And Vomiting     ROS: Review of Systems Review of Systems  All other systems reviewed and are negative.    Physical exam    10/21/2023    8:38 AM 07/29/2023    3:47 PM 07/15/2023    1:16 PM  Vitals with BMI  Height 5' 2 5' 2 5' 2.5  Weight 210 lbs 210 lbs 6 oz 208 lbs  BMI 38.4 38.47 37.41  Systolic 113 134 881  Diastolic 67 80 66  Pulse 75 93 75    Physical Exam Constitutional:      General: She is awake. She is not in acute distress.    Appearance: Normal appearance. She is well-developed, well-groomed and normal weight. She is not ill-appearing, toxic-appearing or diaphoretic.     Interventions:  She is not intubated. HENT:     Head:     Salivary Glands: Right salivary gland is not diffusely enlarged or tender. Left salivary gland is not diffusely enlarged or tender.     Right Ear: Tympanic membrane normal. No laceration, drainage, swelling or tenderness. No middle ear effusion. There is no impacted cerumen. No foreign body. No mastoid tenderness. No PE tube. No hemotympanum. Tympanic membrane is not injected, scarred, perforated, erythematous, retracted or bulging.     Left Ear: Tympanic membrane normal. No laceration, drainage, swelling or tenderness.  No middle ear effusion. There is no impacted cerumen. No foreign body. No mastoid tenderness. No PE tube. No hemotympanum. Tympanic membrane is not injected, scarred, perforated, erythematous, retracted or bulging.     Nose: Nose normal. No nasal deformity, septal deviation, signs of injury, laceration, nasal tenderness, mucosal edema, congestion or rhinorrhea.     Right Sinus: No maxillary sinus tenderness or frontal sinus tenderness.     Left Sinus: No maxillary sinus tenderness or frontal sinus tenderness.     Mouth/Throat:     Mouth: Mucous membranes are moist.   Eyes:     General: Lids are normal.     Extraocular Movements: Extraocular movements intact.     Right eye: Normal extraocular motion.     Left eye: Normal extraocular motion.     Conjunctiva/sclera: Conjunctivae normal.     Right eye: Right conjunctiva is not injected. No chemosis, exudate or hemorrhage.    Left eye: Left conjunctiva is not injected. No chemosis, exudate or hemorrhage.    Pupils: Pupils are equal, round, and reactive to light. Pupils are equal.     Right eye: Pupil is round, reactive and not sluggish. No corneal abrasion.     Left eye: Pupil is round, reactive and not sluggish. No corneal abrasion.     Funduscopic exam:    Right eye: No hemorrhage or exudate. Red reflex present.        Left eye: No hemorrhage or exudate. Red reflex present.  Neck:      Thyroid : No thyroid  mass, thyromegaly or thyroid  tenderness.     Vascular: No carotid bruit.     Trachea: Trachea and phonation normal. No tracheal tenderness, tracheostomy or tracheal deviation.   Cardiovascular:     Rate and Rhythm: Normal rate and regular rhythm.     Pulses: Normal pulses.          Radial pulses are 2+ on the right side and 2+ on the left side.     Heart sounds: Normal heart sounds. No murmur heard.    No friction rub. No gallop.  Pulmonary:     Effort: Pulmonary effort is normal. No tachypnea, bradypnea, accessory muscle usage, prolonged expiration, respiratory distress or retractions. She is not intubated.     Breath sounds: Normal breath sounds. No stridor, decreased air movement or transmitted upper airway sounds. No decreased breath sounds, wheezing, rhonchi or rales.  Chest:     Chest wall: No tenderness.  Abdominal:     General: Abdomen is flat. Bowel sounds are normal. There is no distension or abdominal bruit. There are no signs of injury.     Palpations: Abdomen is soft. There is no shifting dullness, fluid wave, hepatomegaly, splenomegaly, mass or pulsatile mass.     Tenderness: There is no abdominal tenderness. There is no right CVA tenderness, left CVA tenderness, guarding or rebound.     Hernia: No hernia is present.   Musculoskeletal:        General: Normal range of motion.     Cervical back: Full passive range of motion without pain, normal range of motion and neck supple. No edema, erythema, rigidity, torticollis or crepitus. No pain with movement. Normal range of motion.     Right lower leg: No edema.     Left lower leg: No edema.  Lymphadenopathy:     Head:     Right side of head: No submental, submandibular, tonsillar, preauricular or posterior auricular adenopathy.     Left side of head: No submental, submandibular, tonsillar, preauricular or posterior auricular adenopathy.     Cervical: No cervical adenopathy.     Right cervical: No  superficial, deep or posterior cervical adenopathy.    Left cervical: No superficial, deep or posterior cervical adenopathy.   Skin:    General: Skin is warm.     Capillary Refill: Capillary refill takes less than 2 seconds.   Neurological:     General: No focal deficit present.     Mental Status: She is alert, oriented to person, place, and time and easily aroused. Mental status is at baseline.     Cranial Nerves: Cranial nerves 2-12 are intact. No cranial nerve deficit or facial asymmetry.     Motor: Motor function is intact. No weakness.     Coordination: Coordination normal.     Gait: Gait is intact.   Psychiatric:        Attention and Perception: Attention and perception normal.        Mood and Affect: Mood and affect normal.        Speech: Speech normal.        Behavior: Behavior normal. Behavior is cooperative.        Thought Content: Thought content normal.        Cognition and Memory: Cognition and memory normal.        Judgment: Judgment normal.        10/21/2023    8:43 AM 07/15/2023    1:30 PM  07/09/2023    3:18 PM  Depression screen PHQ 2/9  Decreased Interest 0 0 0  Down, Depressed, Hopeless 0 0 0  PHQ - 2 Score 0 0 0  Altered sleeping 0 0 0  Tired, decreased energy 1 0 0  Change in appetite 1 1 1   Feeling bad or failure about yourself  0 0 0  Trouble concentrating 0 0 0  Moving slowly or fidgety/restless 0 0 1  Suicidal thoughts 0 0 0  PHQ-9 Score 2 1 2   Difficult doing work/chores Not difficult at all Not difficult at all Not difficult at all      10/21/2023    8:43 AM 07/15/2023    1:32 PM  GAD 7 : Generalized Anxiety Score  Nervous, Anxious, on Edge 1 1  Control/stop worrying 0 0  Worry too much - different things 0 0  Trouble relaxing 0 0  Restless 0 0  Easily annoyed or irritable 1 1  Afraid - awful might happen 0 0  Total GAD 7 Score 2 2  Anxiety Difficulty Not difficult at all    Assessment/ Plan: Stacy Douglas here for annual physical  exam.   1. Encounter for general adult medical examination without abnormal findings (Primary) Discussed with patient to continue healthy lifestyle choices, including diet (rich in fruits, vegetables, and lean proteins, and low in salt and simple carbohydrates) and exercise (at least 30 minutes of moderate physical activity daily). Limit beverages high is sugar. Recommended at least 80-100 oz of water daily. Declined dexa  Declined colonoscopy/cologuard  Declined Lung cancer screening   2. Screening mammogram for breast cancer - MM 3D SCREENING MAMMOGRAM BILATERAL BREAST; Future  3. Tobacco use disorder Patient has reduced intake. Encouraged cessation. She does not wish to quit at this time. Will continue to assess readiness.   4. Bilateral lower extremity edema Discussed with patient ambulating, elevation, and wearing compression stockings especially while she is working. Patient is established with cardiology and can consider adding diuretic if swelling persists.   5. Carotid stenosis, bilateral Established with cardiology. Continue to follow with specialty.   Patient to follow up in 1 year for annual exam or sooner if needed.  The above assessment and management plan was discussed with the patient. The patient verbalized understanding of and has agreed to the management plan. Patient is aware to call the clinic if symptoms persist or worsen. Patient is aware when to return to the clinic for a follow-up visit. Patient educated on when it is appropriate to go to the emergency department.   Stacy Kins, DNP-FNP [facility] [address] [place], [place] [address] [phone]

## 2023-10-23 ENCOUNTER — Encounter (HOSPITAL_COMMUNITY)
Admission: RE | Admit: 2023-10-23 | Discharge: 2023-10-23 | Disposition: A | Discharge status: Home / Self Care | Source: Ambulatory Visit | Attending: Cardiology | Admitting: Cardiology

## 2023-10-24 ENCOUNTER — Encounter (HOSPITAL_COMMUNITY)

## 2023-10-24 NOTE — Progress Notes (Signed)
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 education on lifestyle modifcations including regular physical activity/exercise, weight management, moderate sodium restriction and increased consumption of fresh fruit, vegetables, and low fat dairy, alcohol moderation, and smoking cessation.;Monitor prescription use compliance.    Expected Outcomes Short Term: Continued assessment and intervention until BP is < 140/39mm HG in hypertensive participants. < 130/12mm HG in hypertensive participants with diabetes, heart failure or chronic kidney disease.;Long Term: Maintenance of blood pressure at goal levels.    Lipids Yes    Intervention  Provide education and support for participant on nutrition & aerobic/resistive exercise along with prescribed medications to achieve LDL 70mg , HDL >40mg .    Expected Outcomes Short Term: Participant states understanding of desired cholesterol values and is compliant with medications prescribed. Participant is following exercise prescription and nutrition guidelines.;Long Term: Cholesterol controlled with medications as prescribed, with individualized exercise RX and with personalized nutrition plan. Value goals: LDL < 70mg , HDL > 40 mg.          Core Components/Risk Factors/Patient Goals Review:   Goals and Risk Factor Review     Row Name 07/02/23 1335             Core Components/Risk Factors/Patient Goals Review   Personal Goals Review Tobacco Cessation       Review Molina is a current tobacco user. Intervention for tobacco cessation was provided at the initial medical review. She was asked about readiness to quit and reported that she is ready to quit once finds right method for her. She is not ready to set quit date yet.  Patient was advised and educated about tobacco cessation using combination therapy, tobacco cessation classes, quit line, and quit smoking apps. Patient demonstrated understanding of this material. Staff will continue to provide encouragement and follow up with the patient throughout the program.  Packet  given to patient at orientation.       Expected Outcomes Short: set quit date Long: Complete cessation          Core Components/Risk Factors/Patient Goals at Discharge (Final Review):   Goals and Risk Factor Review - 07/02/23 1335       Core Components/Risk Factors/Patient Goals Review   Personal Goals Review Tobacco Cessation    Review Vallory is a current tobacco user. Intervention for tobacco cessation was provided at the initial medical review. She was asked about readiness to quit and reported that she is ready to quit once finds right method for her. She is not  ready to set quit date yet.  Patient was advised and educated about tobacco cessation using combination therapy, tobacco cessation classes, quit line, and quit smoking apps. Patient demonstrated understanding of this material. Staff will continue to provide encouragement and follow up with the patient throughout the program.  Packet given to patient at orientation.    Expected Outcomes Short: set quit date Long: Complete cessation          ITP Comments:  ITP Comments     Row Name 07/02/23 1326 07/10/23 1501 07/23/23 0837 08/20/23 0839 09/17/23 0830   ITP Comments Completed virtual orientation today.  EP evaluation is scheduled for 07/09/23 at 1415 .  Documentation for diagnosis can be found in [facility] encounter cath report 05/30/23 and OV 06/27/23. First full day of exercise!  Patient was oriented to gym and equipment including functions, settings, policies, and procedures.  Patient's individual exercise prescription and treatment plan were reviewed.  All starting workloads were established based on the results of the 6 minute walk test done at initial orientation visit.  The plan for exercise progression was also introduced and progression will be customized based on patient's performance and goals. 30 day review completed. ITP sent to Dr. Dorn Ross, Medical Director of Cardiac Rehab. Continue with ITP unless changes are made by physician.    Newer to program 30 day review completed. ITP sent to Dr. Dorn Ross, Medical Director of Cardiac Rehab. Continue with ITP unless changes are made by physician.    Guerline has had spotty attendance this month, last attended on 4/10, unable to assess for goals. 30 day review completed. ITP sent to Dr. Dorn Ross, Medical Director of Cardiac Rehab. Continue with ITP unless changes are made by physician.   Currently out on hold for insurance. Unable to assess for goals. Last attended 08/07/23.    Row Name 10/03/23 [phone] 10/15/23 0921         ITP Comments  Called to check on patient and update for insurance concerns.  Left message. 30 day review completed. ITP sent to Dr. Dorn Ross, Medical Director of Cardiac Rehab. Continue with ITP unless changes are made by physician. Currently out on hold for insurance. Unable to assess for goals. Last attended 08/07/23.         Comments: Discharge ITP

## 2023-10-24 NOTE — Progress Notes (Signed)
 Discharge Progress Report  Patient Details  Name: Stacy Douglas MRN: [id number] Date of Birth: May 06, 1950 Referring Provider:   Flowsheet Row CARDIAC REHAB PHASE II ORIENTATION from 07/09/2023 in [facility] [facility]AC [facility]  Referring Provider Elmira Penman MD     Number of Visits: 9  Reason for Discharge:  Early Exit:  Back to work and Lack of attendance  Smoking History:  Social History   Tobacco Use  Smoking Status Every Day   Current packs/day: 0.01   Average packs/day: (0.6 ttl pk-yrs)   Types: Cigarettes   Start date: 08/10/1964  Smokeless Tobacco Never  Tobacco Comments   She is weaning herself down.     Diagnosis:  No diagnosis found.  ADL UCSD:   Initial Exercise Prescription:  Initial Exercise Prescription - 07/09/23 1500       Date of Initial Exercise RX and Referring Provider   Date 07/09/23    Referring Provider Elmira Penman MD      Oxygen    Maintain Oxygen  Saturation 88% or higher      Treadmill   MPH 1.2    Grade 0    Minutes 15    METs 1.92      NuStep   Level 1    SPM 50    Minutes 15    METs 1.8      Track   Laps 15    Minutes 15    METs 1.5      Prescription Details   Frequency (times per week) 2    Duration Progress to 30 minutes of continuous aerobic without signs/symptoms of physical distress      Intensity   THRR 40-80% of Max Heartrate 109-136    Ratings of Perceived Exertion 11-13    Perceived Dyspnea 0-4      Resistance Training   Training Prescription Yes    Weight 3    Reps 10-15          Discharge Exercise Prescription (Final Exercise Prescription Changes):  Exercise Prescription Changes - 07/09/23 1500       Response to Exercise   Blood Pressure (Admit) 102/52    Blood Pressure (Exercise) 132/68    Blood Pressure (Exit) 116/62    Heart Rate (Admit) 82 bpm    Heart Rate (Exercise) 109 bpm    Heart Rate (Exit) 90 bpm    Oxygen  Saturation (Admit) 98 %    Oxygen  Saturation (Exercise)  98 %    Oxygen  Saturation (Exit) 98 %    Rating of Perceived Exertion (Exercise) 13    Perceived Dyspnea (Exercise) 0    Symptoms LLE Tightness          Functional Capacity:  6 Minute Walk     Row Name 07/09/23 1529         6 Minute Walk   Distance 1105 feet     Walk Time 6 minutes     # of Rest Breaks 1     MPH 2.1     METS 1.86     RPE 13     Perceived Dyspnea  0     VO2 Peak 6.57     Symptoms Yes (comment)     Comments LLE tightness needed 15 sec break     Resting HR 82 bpm     Resting BP 102/52     Resting Oxygen  Saturation  98 %     Exercise Oxygen  Saturation  during 6 min walk 98 %  Max Ex. HR 109 bpm     Max Ex. BP 132/68     2 Minute Post BP 116/62        Psychological, QOL, Others - Outcomes: PHQ 2/9:    10/21/2023    8:43 AM 07/15/2023    1:30 PM 07/09/2023    3:18 PM 03/13/2018    2:43 PM 08/12/2016    3:51 PM  Depression screen PHQ 2/9  Decreased Interest 0 0 0 0 0  Down, Depressed, Hopeless 0 0 0 0 0  PHQ - 2 Score 0 0 0 0 0  Altered sleeping 0 0 0 1   Tired, decreased energy 1 0 0 0   Change in appetite 1 1 1  0   Feeling bad or failure about yourself  0 0 0 0   Trouble concentrating 0 0 0 0   Moving slowly or fidgety/restless 0 0 1 0   Suicidal thoughts 0 0 0 0   PHQ-9 Score 2 1 2 1    Difficult doing work/chores Not difficult at all Not difficult at all Not difficult at all Not difficult at all     Quality of Life:  Quality of Life - 07/02/23 1336       Quality of Life   Select Quality of Life      Quality of Life Scores   Health/Function Pre 25.2 %    Socioeconomic Pre 25.94 %    Psych/Spiritual Pre 27.36 %    Family Pre 19.8 %          Personal Goals: Goals established at orientation with interventions provided to work toward goal.  Personal Goals and Risk Factors at Admission - 07/02/23 1333       Core Components/Risk Factors/Patient Goals on Admission    Weight Management Yes;Weight Loss    Intervention Weight  Management: Develop a combined nutrition and exercise program designed to reach desired caloric intake, while maintaining appropriate intake of nutrient and fiber, sodium and fats, and appropriate energy expenditure required for the weight goal.;Weight Management: Provide education and appropriate resources to help participant work on and attain dietary goals.    Expected Outcomes Short Term: Continue to assess and modify interventions until short term weight is achieved;Long Term: Adherence to nutrition and physical activity/exercise program aimed toward attainment of established weight goal;Weight Loss: Understanding of general recommendations for a balanced deficit meal plan, which promotes 1-2 lb weight loss per week and includes a negative energy balance of [phone] kcal/d;Understanding recommendations for meals to include 15-35% energy as protein, 25-35% energy from fat, 35-60% energy from carbohydrates, less than 200mg  of dietary cholesterol, 20-35 gm of total fiber daily;Understanding of distribution of calorie intake throughout the day with the consumption of 4-5 meals/snacks    Tobacco Cessation Yes    Number of packs per day .15    Intervention Assist the participant in steps to quit. Provide individualized education and counseling about committing to Tobacco Cessation, relapse prevention, and pharmacological support that can be provided by physician.;Education officer, environmental, assist with locating and accessing local/national Quit Smoking programs, and support quit date choice.    Expected Outcomes Short Term: Will demonstrate readiness to quit, by selecting a quit date.;Short Term: Will quit all tobacco product use, adhering to prevention of relapse plan.;Long Term: Complete abstinence from all tobacco products for at least 12 months from quit date.    Hypertension Yes    Intervention Provide education on lifestyle modifcations including regular physical activity/exercise, weight management,  moderate sodium restriction and increased consumption of fresh fruit, vegetables, and low fat dairy, alcohol moderation, and smoking cessation.;Monitor prescription use compliance.    Expected Outcomes Short Term: Continued assessment and intervention until BP is < 140/88mm HG in hypertensive participants. < 130/62mm HG in hypertensive participants with diabetes, heart failure or chronic kidney disease.;Long Term: Maintenance of blood pressure at goal levels.    Lipids Yes    Intervention Provide education and support for participant on nutrition & aerobic/resistive exercise along with prescribed medications to achieve LDL 70mg , HDL >40mg .    Expected Outcomes Short Term: Participant states understanding of desired cholesterol values and is compliant with medications prescribed. Participant is following exercise prescription and nutrition guidelines.;Long Term: Cholesterol controlled with medications as prescribed, with individualized exercise RX and with personalized nutrition plan. Value goals: LDL < 70mg , HDL > 40 mg.           Personal Goals Discharge:  Goals and Risk Factor Review     Row Name 07/02/23 1335             Core Components/Risk Factors/Patient Goals Review   Personal Goals Review Tobacco Cessation       Review Zayden is a current tobacco user. Intervention for tobacco cessation was provided at the initial medical review. She was asked about readiness to quit and reported that she is ready to quit once finds right method for her. She is not ready to set quit date yet.  Patient was advised and educated about tobacco cessation using combination therapy, tobacco cessation classes, quit line, and quit smoking apps. Patient demonstrated understanding of this material. Staff will continue to provide encouragement and follow up with the patient throughout the program.  Packet given to patient at orientation.       Expected Outcomes Short: set quit date Long: Complete cessation           Exercise Goals and Review:  Exercise Goals     Row Name 07/02/23 1327             Exercise Goals   Increase Physical Activity Yes       Intervention Provide advice, education, support and counseling about physical activity/exercise needs.;Develop an individualized exercise prescription for aerobic and resistive training based on initial evaluation findings, risk stratification, comorbidities and participant's personal goals.       Expected Outcomes Short Term: Attend rehab on a regular basis to increase amount of physical activity.;Long Term: Add in home exercise to make exercise part of routine and to increase amount of physical activity.;Long Term: Exercising regularly at least 3-5 days a week.       Increase Strength and Stamina Yes       Intervention Provide advice, education, support and counseling about physical activity/exercise needs.;Develop an individualized exercise prescription for aerobic and resistive training based on initial evaluation findings, risk stratification, comorbidities and participant's personal goals.       Expected Outcomes Short Term: Increase workloads from initial exercise prescription for resistance, speed, and METs.;Short Term: Perform resistance training exercises routinely during rehab and add in resistance training at home;Long Term: Improve cardiorespiratory fitness, muscular endurance and strength as measured by increased METs and functional capacity ( )       Able to understand and use rate of perceived exertion (RPE) scale Yes       Intervention Provide education and explanation on how to use RPE scale       Expected Outcomes Short Term: Able to use RPE daily  in rehab to express subjective intensity level;Long Term:  Able to use RPE to guide intensity level when exercising independently       Able to understand and use Dyspnea scale Yes       Intervention Provide education and explanation on how to use Dyspnea scale       Expected Outcomes Short  Term: Able to use Dyspnea scale daily in rehab to express subjective sense of shortness of breath during exertion;Long Term: Able to use Dyspnea scale to guide intensity level when exercising independently       Knowledge and understanding of Target Heart Rate Range (THRR) Yes       Intervention Provide education and explanation of THRR including how the numbers were predicted and where they are located for reference       Expected Outcomes Short Term: Able to state/look up THRR;Long Term: Able to use THRR to govern intensity when exercising independently;Short Term: Able to use daily as guideline for intensity in rehab       Able to check pulse independently Yes       Intervention Provide education and demonstration on how to check pulse in carotid and radial arteries.;Review the importance of being able to check your own pulse for safety during independent exercise       Expected Outcomes Short Term: Able to explain why pulse checking is important during independent exercise;Long Term: Able to check pulse independently and accurately       Understanding of Exercise Prescription Yes       Intervention Provide education, explanation, and written materials on patient's individual exercise prescription       Expected Outcomes Short Term: Able to explain program exercise prescription;Long Term: Able to explain home exercise prescription to exercise independently          Exercise Goals Re-Evaluation:  Exercise Goals Re-Evaluation     Row Name 07/10/23 1502             Exercise Goal Re-Evaluation   Exercise Goals Review Able to understand and use rate of perceived exertion (RPE) scale;Understanding of Exercise Prescription       Comments Reviewed RPE and dyspnea scale, THR and program prescription with pt today.  Pt voiced understanding and was given a copy of goals to take home.       Expected Outcomes Short: Use RPE daily to regulate intensity.  Long: Follow program prescription in THR.           Nutrition & Weight - Outcomes:  Pre Biometrics - 07/09/23 1532       Pre Biometrics   Height 5' 2.5 (1.588 m)    Weight 95.5 kg    Waist Circumference 45 inches    Hip Circumference 52 inches    Waist to Hip Ratio 0.87 %    BMI (Calculated) 37.87    Grip Strength 22.5 kg    Single Leg Stand 4.45 seconds           Nutrition:  Nutrition Therapy & Goals - 07/02/23 1333       Intervention Plan   Intervention Prescribe, educate and counsel regarding individualized specific dietary modifications aiming towards targeted core components such as weight, hypertension, lipid management, diabetes, heart failure and other comorbidities.;Nutrition handout(s) given to patient.    Expected Outcomes Short Term Goal: Understand basic principles of dietary content, such as calories, fat, sodium, cholesterol and nutrients.;Long Term Goal: Adherence to prescribed nutrition plan.  Nutrition Discharge:   Education Questionnaire Score:  Knowledge Questionnaire Score - 07/02/23 1336       Knowledge Questionnaire Score   Pre Score 23/26          Goals reviewed with patient; copy given to patient.

## 2023-10-28 ENCOUNTER — Other Ambulatory Visit

## 2023-10-29 ENCOUNTER — Ambulatory Visit: Payer: Self-pay | Admitting: Cardiology

## 2023-10-29 LAB — LIPID PANEL
Chol/HDL Ratio: 2.2 ratio (ref 0.0–4.4)
Cholesterol, Total: 92 mg/dL — ABNORMAL LOW (ref 100–199)
HDL: 42 mg/dL (ref >39)
LDL Chol Calc (NIH): 30 mg/dL (ref 0–99)
Triglycerides: 110 mg/dL (ref 0–149)
VLDL Cholesterol Cal: 20 mg/dL (ref 5–40)

## 2023-10-29 NOTE — Telephone Encounter (Signed)
 Patient returned RN's call regarding results.

## 2023-10-30 ENCOUNTER — Encounter (HOSPITAL_COMMUNITY)

## 2023-10-31 ENCOUNTER — Encounter (HOSPITAL_COMMUNITY)

## 2023-11-06 ENCOUNTER — Encounter (HOSPITAL_COMMUNITY)

## 2023-11-06 DIAGNOSIS — H01001 Unspecified blepharitis right upper eyelid: Secondary | ICD-10-CM | POA: Diagnosis not present

## 2023-11-06 DIAGNOSIS — H01004 Unspecified blepharitis left upper eyelid: Secondary | ICD-10-CM | POA: Diagnosis not present

## 2023-11-06 DIAGNOSIS — H2512 Age-related nuclear cataract, left eye: Secondary | ICD-10-CM | POA: Diagnosis not present

## 2023-11-06 DIAGNOSIS — H40013 Open angle with borderline findings, low risk, bilateral: Secondary | ICD-10-CM | POA: Diagnosis not present

## 2023-11-06 DIAGNOSIS — H01002 Unspecified blepharitis right lower eyelid: Secondary | ICD-10-CM | POA: Diagnosis not present

## 2023-11-10 ENCOUNTER — Encounter

## 2023-11-10 ENCOUNTER — Ambulatory Visit

## 2023-11-11 ENCOUNTER — Ambulatory Visit

## 2023-11-13 ENCOUNTER — Encounter: Payer: Self-pay | Admitting: Cardiology

## 2023-11-13 ENCOUNTER — Ambulatory Visit: Attending: Cardiology | Admitting: Cardiology

## 2023-11-13 ENCOUNTER — Encounter (HOSPITAL_COMMUNITY)

## 2023-11-13 VITALS — BP 136/82 | HR 85 | Ht 64.0 in | Wt 210.2 lb

## 2023-11-13 DIAGNOSIS — T466X5D Adverse effect of antihyperlipidemic and antiarteriosclerotic drugs, subsequent encounter: Secondary | ICD-10-CM

## 2023-11-13 DIAGNOSIS — E782 Mixed hyperlipidemia: Secondary | ICD-10-CM

## 2023-11-13 DIAGNOSIS — I25119 Atherosclerotic heart disease of native coronary artery with unspecified angina pectoris: Secondary | ICD-10-CM | POA: Diagnosis not present

## 2023-11-13 DIAGNOSIS — I10 Essential (primary) hypertension: Secondary | ICD-10-CM | POA: Diagnosis not present

## 2023-11-13 DIAGNOSIS — M791 Myalgia, unspecified site: Secondary | ICD-10-CM | POA: Diagnosis not present

## 2023-11-13 NOTE — Progress Notes (Signed)
 Cardiology Office Note  Date: 11/13/2023   ID: MAHKAYLA PREECE, DOB 11-27-1950, MRN [id number]  History of Present Illness: Stacy Douglas is a 73 y.o. female last seen in April.  She is here for a routine visit.  States that she has been doing well, no significant angina or interval nitroglycerin  use, stable NYHA class II dyspnea.  She did not ultimately pursue cardiac rehabilitation due to interval billing and cost concerns.  This has been straightened out and she plans to start going to her local [employer] to use the treadmill which she has tolerated well in the past.  We went over her medications.  She is doing very well on Repatha , no obvious side effects and significant improvement in LDL down to 30.  Otherwise no change in cardiac regimen.  Physical Exam: VS:  BP 136/82 (BP Location: Left Arm)   Pulse 85   Ht 5' 4 (1.626 m)   Wt 210 lb 3.2 oz (95.3 kg)   SpO2 97%   BMI 36.08 kg/m , BMI Body mass index is 36.08 kg/m.  Wt Readings from Last 3 Encounters:  11/13/23 210 lb 3.2 oz (95.3 kg)  10/21/23 210 lb (95.3 kg)  07/29/23 210 lb 6.4 oz (95.4 kg)    General: Patient appears comfortable at rest. HEENT: Conjunctiva and lids normal. Neck: Supple, no elevated JVP or carotid bruits. Lungs: Clear to auscultation, nonlabored breathing at rest. Cardiac: Regular rate and rhythm, no S3 or significant systolic murmur. Extremities: No pitting edema.  ECG:  An ECG dated 05/30/2023 was personally reviewed today and demonstrated:  Sinus rhythm.  Labwork: 05/21/2023: ALT 15; AST 18 05/31/2023: BUN 13; Creatinine, Ser 0.91; Hemoglobin 13.8; Platelets 262; Potassium 4.0; Sodium 140 06/27/2023: Magnesium 2.2; TSH 1.500     Component Value Date/Time   CHOL 92 (L) 10/28/2023 0921   TRIG 110 10/28/2023 0921   TRIG 118 11/02/2013 1428   HDL 42 10/28/2023 0921   HDL 41 11/02/2013 1428   CHOLHDL 2.2 10/28/2023 0921   CHOLHDL 3.3 12/18/2017 1202   VLDL 23 10/08/2017 0510   LDLCALC 30 10/28/2023  0921   LDLCALC 85 12/18/2017 1202   LDLCALC 103 (H) 11/02/2013 1428   Other Studies Reviewed Today:  Cardiac Monitor April 2025: ZIO monitor reviewed.  13 days, 19 hours analyzed.   Predominant rhythm is sinus with heart rate ranging from 59 bpm up to 131 bpm and average heart rate 80 bpm. There were rare PACs including atrial couplets representing less than 1% total beats. There were two very brief episodes of PSVT noted, the longest of which was only 6 beats. There were rare PVCs representing less than 1% total beats. No sustained arrhythmias. No pauses or high degree heart block.  Assessment and Plan:  1.  CAD status post BMS to the LAD and PTCA of the circumflex in 2011, DES to the proximal and mid RCA in 2016, and DES to the mid circumflex in 2019.  Cardiac catheterization in January demonstrates multivessel disease with relatively small caliber vessels and plan for medical therapy, CABG was not recommended as initial management strategy.  She did undergo angioplasty of the proximal to mid LAD for treatment of in-stent restenosis.  At this point doing well with no interval angina or nitroglycerin  use.  She did not tolerate Imdur  secondary to nausea.  Plan to continue aspirin  81 mg daily, Effient  10 mg daily, Toprol -XL 25 mg daily, Repatha  140 mg every 14 days, and as needed nitroglycerin .  2.  Primary hypertension.  No changes in current regimen.   3.  Mixed hyperlipidemia.  She has a history of statin intolerance.  Recent LDL down to 30 on Repatha  140 mg every 14 days.   4.  Moderate bilateral ICA stenosis, 40 to 59% RICA and LICA by Dopplers in March.  Continue aspirin  and Repatha .  Disposition:  Follow up 6 months.  Signed, Jayson JUDITHANN Sierras, M.D., F.A.C.C. [facility]

## 2023-11-13 NOTE — Patient Instructions (Addendum)

## 2023-11-21 ENCOUNTER — Ambulatory Visit (INDEPENDENT_AMBULATORY_CARE_PROVIDER_SITE_OTHER)

## 2023-11-21 NOTE — Progress Notes (Signed)
 Patient is in office today for a nurse visit for Medication Education. Patient was provided with instruction and demonstration on how to Administer Medication

## 2023-12-15 DIAGNOSIS — H2512 Age-related nuclear cataract, left eye: Secondary | ICD-10-CM | POA: Diagnosis not present

## 2023-12-16 NOTE — H&P (Signed)
 Surgical History & Physical  Patient Name: Stacy Douglas  DOB: 06/16/1950  Surgery: Cataract extraction with intraocular lens implant phacoemulsification; Left Eye Surgeon: Lynwood Hermann MD Surgery Date: 12/22/2023 Pre-Op Date: 11/06/2023  HPI: A 73 Yr. old female patient 1. The patient is a new patient referred by Dr. Nicholaus here for Cataract sx. The patient complains of difficulty when driving at night, which began 6 months ago. The left eye is affected. The episode is constant. The patient describes foggy symptoms affecting their eyes/vision. This is negatively affecting the patient's quality of life and the patient is unable to function adequately in life with the current level of vision. HPI was performed by Lynwood Hermann .  Medical History: Cataracts  Heart Problem  Review of Systems Cardiovascular heartattack All recorded systems are negative except as noted above.  Social Current every day smoker   Medication Prednisolone-moxiflox-bromfen,  Repatha  Syringe, Effient , Metoprolol  succinate  Sx/Procedures Phaco c IOL OD,  Heart Cauterization  Drug Allergies  Codeine  History & Physical: Heent: cataract NECK: supple without bruits LUNGS: lungs clear to auscultation CV: regular rate and rhythm Abdomen: soft and non-tender  Impression & Plan: Assessment: 1.  NUCLEAR SCLEROSIS AGE RELATED; Left Eye (H25.12) 2.  BLEPHARITIS; Right Upper Lid, Right Lower Lid, Left Upper Lid, Left Lower Lid (H01.001, H01.002,H01.004,H01.005) 3.  DERMATOCHALASIS, no surgery; Right Upper Lid, Left Upper Lid (H02.831, H02.834) 4.  INTRAOCULAR LENS IOL ; Right Eye (Z96.1) 5.  VITREOUS DETACHMENT PVD; Both Eyes (H43.813) 6.  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  Cataract accounts for the patient's decreased vision. This visual impairment is not correctable with a tolerable change in glasses or contact lenses. Cataract surgery with an implantation of a new lens should  significantly improve the visual and functional status of the patient. Discussed all risks, benefits, alternatives, and potential complications. Discussed the procedures and recovery. Patient desires to have surgery. A-scan ordered and performed today for intra-ocular lens calculations. The surgery will be performed in order to improve vision for driving, reading, and for eye examinations. Recommend phacoemulsification with intra-ocular lens. Recommend Dextenza for post-operative pain and inflammation. History of refractive Surgery: None Use of Eye Pressure Lowering Drops: No Left Eye - only. Surgery required to correct imbalance of vision. Dilates poorly - shugarcaine or Lidocaine +Omidira by protocol Malyugin Ring.  2.  Blepharitis is present - recommend regular lid cleaning.  3.  Can address after cataract surgery.  4.  Will look for IOL model to balance for left eye surgery.  5.  Old Asymptomatic. RD precautions given. Patient to call with increase in flashing lights/floaters/dark curtain. Symptomatic.  6.  Likely physiologic cupping. Based on cup-to-disc ratio. OCT rNFL shows: WNL OU Detailed discussion about glaucoma today including importance of maintaining good follow up and following treatment plan, and the possibility of irreversible blindness as part of this disease process.

## 2023-12-17 ENCOUNTER — Other Ambulatory Visit: Payer: Self-pay

## 2023-12-17 ENCOUNTER — Encounter (HOSPITAL_COMMUNITY)
Admission: RE | Admit: 2023-12-17 | Discharge: 2023-12-17 | Disposition: A | Discharge status: Home / Self Care | Source: Ambulatory Visit | Attending: Ophthalmology | Admitting: Ophthalmology

## 2023-12-17 ENCOUNTER — Encounter (HOSPITAL_COMMUNITY): Payer: Self-pay

## 2023-12-22 ENCOUNTER — Encounter (HOSPITAL_COMMUNITY)
Admission: RE | Disposition: A | Discharge status: Home / Self Care | Payer: Self-pay | Source: Home / Self Care | Attending: Ophthalmology

## 2023-12-22 ENCOUNTER — Ambulatory Visit (HOSPITAL_COMMUNITY): Admitting: Anesthesiology

## 2023-12-22 ENCOUNTER — Ambulatory Visit (HOSPITAL_COMMUNITY)
Admission: RE | Admit: 2023-12-22 | Discharge: 2023-12-22 | Disposition: A | Discharge status: Home / Self Care | Attending: Ophthalmology | Admitting: Ophthalmology

## 2023-12-22 DIAGNOSIS — H2512 Age-related nuclear cataract, left eye: Secondary | ICD-10-CM | POA: Diagnosis not present

## 2023-12-22 DIAGNOSIS — I252 Old myocardial infarction: Secondary | ICD-10-CM | POA: Diagnosis not present

## 2023-12-22 DIAGNOSIS — Z6834 Body mass index (BMI) 34.0-34.9, adult: Secondary | ICD-10-CM | POA: Diagnosis not present

## 2023-12-22 DIAGNOSIS — F17200 Nicotine dependence, unspecified, uncomplicated: Secondary | ICD-10-CM | POA: Insufficient documentation

## 2023-12-22 DIAGNOSIS — I2510 Atherosclerotic heart disease of native coronary artery without angina pectoris: Secondary | ICD-10-CM | POA: Diagnosis not present

## 2023-12-22 DIAGNOSIS — I10 Essential (primary) hypertension: Secondary | ICD-10-CM | POA: Insufficient documentation

## 2023-12-22 DIAGNOSIS — E669 Obesity, unspecified: Secondary | ICD-10-CM | POA: Diagnosis not present

## 2023-12-22 DIAGNOSIS — E782 Mixed hyperlipidemia: Secondary | ICD-10-CM | POA: Diagnosis not present

## 2023-12-22 DIAGNOSIS — Z86718 Personal history of other venous thrombosis and embolism: Secondary | ICD-10-CM | POA: Insufficient documentation

## 2023-12-22 HISTORY — PX: CATARACT EXTRACTION W/PHACO: SHX586

## 2023-12-22 SURGERY — PHACOEMULSIFICATION, CATARACT, WITH IOL INSERTION
Anesthesia: Monitor Anesthesia Care | Site: Eye | Laterality: Left

## 2023-12-22 MED ORDER — MIDAZOLAM HCL 2 MG/2ML IJ SOLN
INTRAMUSCULAR | Status: AC
  Filled 2023-12-22: qty 2

## 2023-12-22 MED ORDER — POVIDONE-IODINE 5 % OP SOLN
OPHTHALMIC | Status: DC | PRN
  Administered 2023-12-22: 1 via OPHTHALMIC

## 2023-12-22 MED ORDER — MIDAZOLAM HCL 2 MG/2ML IJ SOLN
INTRAMUSCULAR | Status: DC | PRN
Start: 2023-12-22 — End: 2023-12-22
  Administered 2023-12-22: 2 mg via INTRAVENOUS

## 2023-12-22 MED ORDER — SODIUM HYALURONATE 10 MG/ML IO SOLUTION
PREFILLED_SYRINGE | INTRAOCULAR | Status: DC | PRN
  Administered 2023-12-22: .85 mL via INTRAOCULAR

## 2023-12-22 MED ORDER — PHENYLEPHRINE-KETOROLAC 1-0.3 % IO SOLN
INTRAOCULAR | Status: DC | PRN
  Administered 2023-12-22: 500 mL via OPHTHALMIC

## 2023-12-22 MED ORDER — LIDOCAINE HCL (PF) 1 % IJ SOLN
INTRAMUSCULAR | Status: DC | PRN
  Administered 2023-12-22: 5 mL

## 2023-12-22 MED ORDER — SODIUM HYALURONATE 23MG/ML IO SOSY
PREFILLED_SYRINGE | INTRAOCULAR | Status: DC | PRN
  Administered 2023-12-22: .6 mL via INTRAOCULAR

## 2023-12-22 MED ORDER — LACTATED RINGERS IV SOLN: INTRAVENOUS | Status: DC

## 2023-12-22 MED ORDER — LIDOCAINE HCL 3.5 % OP GEL
1.0000 | Freq: Once | OPHTHALMIC | Status: AC
  Administered 2023-12-22: 1 via OPHTHALMIC

## 2023-12-22 MED ORDER — TETRACAINE HCL 0.5 % OP SOLN
1.0000 [drp] | OPHTHALMIC | Status: AC | PRN
  Administered 2023-12-22 (×3): 1 [drp] via OPHTHALMIC

## 2023-12-22 MED ORDER — STERILE WATER FOR IRRIGATION IR SOLN
Status: DC | PRN
  Administered 2023-12-22: 250 mL

## 2023-12-22 MED ORDER — MOXIFLOXACIN HCL 5 MG/ML IO SOLN
INTRAOCULAR | Status: DC | PRN
  Administered 2023-12-22: .2 mL via OPHTHALMIC

## 2023-12-22 MED ORDER — TROPICAMIDE 1 % OP SOLN
1.0000 [drp] | OPHTHALMIC | Status: AC | PRN
  Administered 2023-12-22 (×3): 1 [drp] via OPHTHALMIC

## 2023-12-22 MED ORDER — PHENYLEPHRINE HCL 2.5 % OP SOLN
1.0000 [drp] | OPHTHALMIC | Status: AC | PRN
  Administered 2023-12-22 (×3): 1 [drp] via OPHTHALMIC

## 2023-12-22 MED ORDER — BSS IO SOLN
INTRAOCULAR | Status: DC | PRN
  Administered 2023-12-22: 15 mL via INTRAOCULAR

## 2023-12-22 MED ORDER — SODIUM CHLORIDE 0.9% FLUSH
INTRAVENOUS | Status: DC | PRN
  Administered 2023-12-22: 5 mL via INTRAVENOUS

## 2023-12-22 SURGICAL SUPPLY — 11 items
CLOTH BEACON ORANGE TIMEOUT ST (SAFETY) ×2 IMPLANT
EYE SHIELD UNIVERSAL CLEAR (GAUZE/BANDAGES/DRESSINGS) IMPLANT
FEE CATARACT SUITE SIGHTPATH (MISCELLANEOUS) ×2 IMPLANT
GLOVE BIOGEL PI IND STRL 7.0 (GLOVE) ×4 IMPLANT
LENS IOL CLRN WGN WHL 18.0 (Intraocular Lens) IMPLANT
NDL HYPO 18GX1.5 BLUNT FILL (NEEDLE) ×2 IMPLANT
NEEDLE HYPO 18GX1.5 BLUNT FILL (NEEDLE) ×1 IMPLANT
PAD ARMBOARD POSITIONER FOAM (MISCELLANEOUS) ×2 IMPLANT
SYR TB 1ML LL NO SAFETY (SYRINGE) ×2 IMPLANT
TAPE SURG TRANSPORE 1 IN (GAUZE/BANDAGES/DRESSINGS) IMPLANT
WATER STERILE IRR 250ML POUR (IV SOLUTION) ×2 IMPLANT

## 2023-12-22 NOTE — Transfer of Care (Signed)
 Immediate Anesthesia Transfer of Care Note  Patient: Stacy Douglas  Procedure(s) Performed: PHACOEMULSIFICATION, CATARACT, WITH IOL INSERTION (Left: Eye)  Patient Location: Short Stay  Anesthesia Type:MAC  Level of Consciousness: awake, alert , oriented, and patient cooperative  Airway & Oxygen  Therapy: Patient Spontanous Breathing  Post-op Assessment: Report given to RN, Post -op Vital signs reviewed and stable, and Patient moving all extremities X 4  Post vital signs: Reviewed and stable  Last Vitals:  Vitals Value Taken Time  BP 117/69 12/22/23 12:49  Temp 36.8 C 12/22/23 12:49  Pulse 67 12/22/23 12:49  Resp 17 12/22/23 12:49  SpO2 99 % 12/22/23 12:49    Last Pain:  Vitals:   12/22/23 1249  TempSrc: Oral  PainSc: 0-No pain      Patients Stated Pain Goal: 5 (12/22/23 1053)  Complications: No notable events documented.

## 2023-12-22 NOTE — Discharge Instructions (Addendum)
 Please discharge patient when stable, will follow up today with Dr. June Leap at the [facility] office immediately following discharge.  Leave shield in place until visit.  All paperwork with discharge instructions will be given at the office.  [facility] Address:  [address]  [place], [place] [address]

## 2023-12-22 NOTE — Interval H&P Note (Signed)
 History and Physical Interval Note:  12/22/2023 11:42 AM  Stacy Douglas  has presented today for surgery, with the diagnosis of nuclear sclerotic cataract, left eye.  The various methods of treatment have been discussed with the patient and family. After consideration of risks, benefits and other options for treatment, the patient has consented to  Procedure(s) with comments: PHACOEMULSIFICATION, CATARACT, WITH IOL INSERTION (Left) - CDE: as a surgical intervention.  The patient's history has been reviewed, patient examined, no change in status, stable for surgery.  I have reviewed the patient's chart and labs.  Questions were answered to the patient's satisfaction.     HARRIE AGENT

## 2023-12-22 NOTE — Anesthesia Preprocedure Evaluation (Signed)
 Anesthesia Evaluation  Patient identified by MRN, date of birth, ID band Patient awake    Reviewed: Allergy & Precautions, H&P , NPO status , Patient's Chart, lab work & pertinent test results, reviewed documented beta blocker date and time   Airway Mallampati: II  TM Distance: >3 FB Neck ROM: full    Dental no notable dental hx.    Pulmonary neg pulmonary ROS, Current Smoker and Patient abstained from smoking.   Pulmonary exam normal breath sounds clear to auscultation       Cardiovascular Exercise Tolerance: Good hypertension, + angina  + CAD and + Past MI   Rhythm:regular Rate:Normal     Neuro/Psych negative neurological ROS  negative psych ROS   GI/Hepatic negative GI ROS, Neg liver ROS,,,  Endo/Other  negative endocrine ROS    Renal/GU negative Renal ROS  negative genitourinary   Musculoskeletal   Abdominal   Peds  Hematology negative hematology ROS (+)   Anesthesia Other Findings   Reproductive/Obstetrics negative OB ROS                              Anesthesia Physical Anesthesia Plan  ASA: 3  Anesthesia Plan: MAC   Post-op Pain Management:    Induction:   PONV Risk Score and Plan:   Airway Management Planned:   Additional Equipment:   Intra-op Plan:   Post-operative Plan:   Informed Consent: I have reviewed the patients History and Physical, chart, labs and discussed the procedure including the risks, benefits and alternatives for the proposed anesthesia with the patient or authorized representative who has indicated his/her understanding and acceptance.     Dental Advisory Given  Plan Discussed with: CRNA  Anesthesia Plan Comments:         Anesthesia Quick Evaluation

## 2023-12-22 NOTE — Op Note (Signed)
 Date of procedure: 12/22/23  Pre-operative diagnosis: Visually significant age-related nuclear cataract, Left Eye (H25.12)  Post-operative diagnosis: Visually significant age-related nuclear cataract, Left Eye  Procedure: Removal of cataract via phacoemulsification and insertion of intra-ocular lens Alcon DIB00 +18.0D into the capsular bag of the Left Eye  Attending surgeon: Lynwood LABOR. Sascha Palma, MD, MA  Anesthesia: MAC, Topical Akten  Complications: None  Estimated Blood Loss: <45mL (minimal)  Specimens: None  Implants: As above  Indications:  Visually significant age-related cataract, Left Eye  Procedure:  The patient was seen and identified in the pre-operative area. The operative eye was identified and dilated.  The operative eye was marked.  Topical anesthesia was administered to the operative eye.     The patient was then to the operative suite and placed in the supine position.  A timeout was performed confirming the patient, procedure to be performed, and all other relevant information.   The patient's face was prepped and draped in the usual fashion for intra-ocular surgery.  A lid speculum was placed into the operative eye and the surgical microscope moved into place and focused.  An inferotemporal paracentesis was created using a 20 gauge paracentesis blade. Omidria  was injected into the anterior chamber. Shugarcaine was injected into the anterior chamber.  Viscoelastic was injected into the anterior chamber.  A temporal clear-corneal main wound incision was created using a 2.77mm microkeratome.  A continuous curvilinear capsulorrhexis was initiated using an irrigating cystitome and completed using capsulorrhexis forceps.  Hydrodissection and hydrodeliniation were performed.  Viscoelastic was injected into the anterior chamber.  A phacoemulsification handpiece and a chopper as a second instrument were used to remove the nucleus and epinucleus. The irrigation/aspiration handpiece was  used to remove any remaining cortical material.   The capsular bag was reinflated with viscoelastic, checked, and found to be intact.  The intraocular lens was inserted into the capsular bag.  The irrigation/aspiration handpiece was used to remove any remaining viscoelastic.  The clear corneal wound and paracentesis wounds were then hydrated and checked with Weck-Cels to be watertight. 0.1mL of moxifloxacin  was injected into the anterior chamber. The lid-speculum and drape was removed, and the patient's face was cleaned with a wet and dry 4x4. A clear shield was taped over the eye. The patient was taken to the post-operative care unit in good condition, having tolerated the procedure well.  Post-Op Instructions: The patient will follow up at [facility] for a same day post-operative evaluation and will receive all other orders and instructions.

## 2023-12-23 ENCOUNTER — Encounter (HOSPITAL_COMMUNITY): Payer: Self-pay | Admitting: Ophthalmology

## 2023-12-27 NOTE — Anesthesia Postprocedure Evaluation (Signed)
 Anesthesia Post Note  Patient: Stacy Douglas  Procedure(s) Performed: PHACOEMULSIFICATION, CATARACT, WITH IOL INSERTION (Left: Eye)  Patient location during evaluation: Phase II Anesthesia Type: MAC Level of consciousness: awake Pain management: pain level controlled Vital Signs Assessment: post-procedure vital signs reviewed and stable Respiratory status: spontaneous breathing and respiratory function stable Cardiovascular status: blood pressure returned to baseline and stable Postop Assessment: no headache and no apparent nausea or vomiting Anesthetic complications: no Comments: Late entry   No notable events documented.   Last Vitals:  Vitals:   12/22/23 1053 12/22/23 1249  BP: (!) 146/73 117/69  Pulse: 70 67  Resp: 18 17  Temp: 36.9 C 36.8 C  SpO2: 97% 99%    Last Pain:  Vitals:   12/22/23 1249  TempSrc: Oral  PainSc: 0-No pain                 Yvonna JINNY Bosworth

## 2023-12-31 ENCOUNTER — Encounter: Payer: Self-pay | Admitting: *Deleted

## 2024-01-20 ENCOUNTER — Encounter: Payer: Self-pay | Admitting: Family Medicine

## 2024-01-20 ENCOUNTER — Ambulatory Visit: Payer: Self-pay | Admitting: Family Medicine

## 2024-01-20 ENCOUNTER — Ambulatory Visit: Admitting: Family Medicine

## 2024-01-20 VITALS — BP 121/66 | HR 76 | Temp 97.3°F | Ht 64.0 in | Wt 213.8 lb

## 2024-01-20 DIAGNOSIS — I252 Old myocardial infarction: Secondary | ICD-10-CM | POA: Diagnosis not present

## 2024-01-20 DIAGNOSIS — I200 Unstable angina: Secondary | ICD-10-CM | POA: Diagnosis not present

## 2024-01-20 DIAGNOSIS — Z1231 Encounter for screening mammogram for malignant neoplasm of breast: Secondary | ICD-10-CM

## 2024-01-20 DIAGNOSIS — Z13820 Encounter for screening for osteoporosis: Secondary | ICD-10-CM

## 2024-01-20 DIAGNOSIS — M65312 Trigger thumb, left thumb: Secondary | ICD-10-CM

## 2024-01-20 DIAGNOSIS — Z955 Presence of coronary angioplasty implant and graft: Secondary | ICD-10-CM

## 2024-01-20 DIAGNOSIS — Z780 Asymptomatic menopausal state: Secondary | ICD-10-CM

## 2024-01-20 DIAGNOSIS — E785 Hyperlipidemia, unspecified: Secondary | ICD-10-CM | POA: Diagnosis not present

## 2024-01-20 DIAGNOSIS — I10 Essential (primary) hypertension: Secondary | ICD-10-CM

## 2024-01-20 DIAGNOSIS — E559 Vitamin D deficiency, unspecified: Secondary | ICD-10-CM

## 2024-01-20 DIAGNOSIS — R7309 Other abnormal glucose: Secondary | ICD-10-CM | POA: Diagnosis not present

## 2024-01-20 DIAGNOSIS — I6523 Occlusion and stenosis of bilateral carotid arteries: Secondary | ICD-10-CM

## 2024-01-20 LAB — BAYER DCA HB A1C WAIVED: HB A1C (BAYER DCA - WAIVED): 5.6 % (ref 4.8–5.6)

## 2024-01-20 MED ORDER — PREDNISONE 20 MG PO TABS: 40.0000 mg | ORAL_TABLET | Freq: Every day | ORAL | 0 refills | Status: AC

## 2024-01-20 NOTE — Progress Notes (Signed)
 Subjective:  Patient ID: Stacy Douglas, female    DOB: August 24, 1950, 73 y.o.   MRN: [id number]  Patient Care Team: Severa Rock HERO, FNP as PCP - General (Family Medicine) Debera Jayson MATSU, MD as PCP - Cardiology (Cardiology)   Chief Complaint:  Establish Care (Former Marry patient ) and Medical Management of Chronic Issues (Patient states for the last few weeks her left thumb will lock up. )   HPI: Stacy Douglas is a 73 y.o. female presenting on 01/20/2024 for Establish Care (Former Marry patient ) and Medical Management of Chronic Issues (Patient states for the last few weeks her left thumb will lock up. )   Stacy Douglas is a 73 year old female with coronary artery disease and carotid artery stenosis who presents for follow-up.  She has been receiving treatment for her cardiac issues and was recently started on Repatha , which effectively lowered her LDL cholesterol by 70 points over three months without any side effects. She continues on dual antiplatelet therapy with Effient  and aspirin  due to her history of stents, despite being on Effient  longer than the typical duration. No recent angina, chest pain, shortness of breath, or dizziness.  Regarding her carotid artery stenosis, she is unsure of any recent interventions but recalls a CT angiogram showing some carotid disease. A carotid ultrasound was ordered in February but has not been completed. She experiences ankle swelling, particularly in the left leg, which worsens with prolonged sitting. An ultrasound of her legs showed no significant findings.  Her thumb has been locking up for the past two weeks, which she describes as a clicking. No reported injuries. Does work on a computer daily. She has not tried anything for the symptoms.   Her last mammogram was missed due to a work meeting, and she plans to reschedule it. She has not had a recent DEXA scan. Recent labs showed low calcium  and slightly elevated glucose. No known  family history of diabetes or past issues with low vitamin D .  She has four grandsons and wants to see them grow up. She manages stress by walking away from stressful situations and has adjusted her movements to prevent dizziness when standing up quickly.          Relevant past medical, surgical, family, and social history reviewed and updated as indicated.  Allergies and medications reviewed and updated. Data reviewed: Chart in Epic.   Past Medical History:  Diagnosis Date   Coronary atherosclerosis of native coronary artery    a. VF arrest in [place]  s/p BMS LAD 5/11, PTCA circumflex 6/11, LVEF 40%. b. [place]  10/2014 s/p DES to prox and mid RCA, otherwise patent LAD stent and moderate disease in the Cx, LVEF 55-65%. c. low-risk NST in 06/2017 d. 09/2017: NSTEMI with cath showing 99% mid-LCx stenosis --> treated with DES; patent stents along LAD and RCA.    DVT (deep venous thrombosis) (HCC) 1992   LLE   Essential hypertension    History of duodenal ulcer 1960s   Mixed hyperlipidemia    Myocardial infarction (HCC) 08/2009   complicated by VF arrest   NSTEMI (non-ST elevated myocardial infarction) (HCC) 10/07/2017   thelbert 10/07/2017   Obesity    Plavix  resistance    Tobacco abuse     Past Surgical History:  Procedure Laterality Date   APPENDECTOMY     BREAST BIOPSY Right    CARDIAC CATHETERIZATION N/A 11/16/2014   Procedure: Left Heart Cath and Coronary Angiography;  Surgeon:  Deatrice DELENA Cage, MD;  Location: [facility];  Service: Cardiovascular;  Laterality: N/A;   CARDIAC CATHETERIZATION N/A 11/16/2014   Procedure: Coronary Stent Intervention;  Surgeon: Deatrice DELENA Cage, MD;  Location: [facility];  Service: Cardiovascular;  Laterality: N/A;   CATARACT EXTRACTION W/ INTRAOCULAR LENS IMPLANT Right 1990s   CATARACT EXTRACTION W/PHACO Left 12/22/2023   Procedure: PHACOEMULSIFICATION, CATARACT, WITH IOL INSERTION;  Surgeon: Harrie Agent, MD;  Location: [facility];   Service: Ophthalmology;  Laterality: Left;  CDE: 13.04   CORONARY ANGIOPLASTY WITH STENT PLACEMENT  2011   CORONARY ANGIOPLASTY WITH STENT PLACEMENT  10/07/2017   CORONARY PRESSURE/FFR STUDY N/A 05/30/2023   Procedure: CORONARY PRESSURE/FFR STUDY;  Surgeon: Elmira Newman PARAS, MD;  Location: [facility];  Service: Cardiovascular;  Laterality: N/A;   CORONARY STENT INTERVENTION N/A 10/07/2017   Procedure: CORONARY STENT INTERVENTION;  Surgeon: Swaziland, Peter M, MD;  Location: [facility] CV [facility];  Service: Cardiovascular;  Laterality: N/A;   LEFT HEART CATH AND CORONARY ANGIOGRAPHY N/A 10/07/2017   Procedure: LEFT HEART CATH AND CORONARY ANGIOGRAPHY;  Surgeon: Swaziland, Peter M, MD;  Location: [facility] INV[facility]IVE CV [facility];  Service: Cardiovascular;  Laterality: N/A;   LEFT HEART CATH AND CORONARY ANGIOGRAPHY N/A 05/30/2023   Procedure: LEFT HEART CATH AND CORONARY ANGIOGRAPHY;  Surgeon: Elmira Newman PARAS, MD;  Location: [facility];  Service: Cardiovascular;  Laterality: N/A;   MASS EXCISION  1980s   tooks lumps off my neck; front and back    Social History   Socioeconomic History   Marital status: Divorced    Spouse name: Not on file   Number of children: Not on file   Years of education: Not on file   Highest education level: 12th grade  Occupational History   Occupation: [employer]  Tobacco Use   Smoking status: Former    Current packs/day: 0.01    Average packs/day: (0.6 ttl pk-yrs)    Types: Cigarettes    Start date: 08/10/1964   Smokeless tobacco: Never   Tobacco comments:    She is weaning herself down.   Vaping Use   Vaping status: Never Used  Substance and Sexual Activity   Alcohol use: Not Currently   Drug use: Never   Sexual activity: Not Currently  Other Topics Concern   Not on file  Social History Narrative   Not on file   Social Drivers of Health   Financial Resource Strain: Low Risk  (10/21/2023)   Overall Financial Resource Strain (CARDIA)    Difficulty  of Paying Living Expenses: Not hard at all  Food Insecurity: No Food Insecurity (10/21/2023)   Hunger Vital Sign    Worried About Running Out of Food in the Last Year: Never true    Ran Out of Food in the Last Year: Never true  Transportation Needs: No Transportation Needs (10/21/2023)   PRAPARE - Administrator, Civil Service (Medical): No    Lack of Transportation (Non-Medical): No  Physical Activity: Insufficiently Active (10/21/2023)   Exercise Vital Sign    Days of Exercise per Week: 2 days    Minutes of Exercise per Session: 20 min  Stress: No Stress Concern Present (10/21/2023)   [employer] of Occupational Health - Occupational Stress Questionnaire    Feeling of Stress: Only a little  Social Connections: Unknown (10/21/2023)   Social Connection and Isolation Panel    Frequency of Communication with Friends and Family: Not on file  Frequency of Social Gatherings with Friends and Family: Once a week    Attends Religious Services: More than 4 times per year    Active Member of [employer] or Organizations: Yes    Attends Engineer, structural: More than 4 times per year    Marital Status: Divorced  Intimate Partner Violence: Not At Risk (05/30/2023)   Humiliation, Afraid, Rape, and Kick questionnaire    Fear of Current or Ex-Partner: No    Emotionally Abused: No    Physically Abused: No    Sexually Abused: No    Outpatient Encounter Medications as of 01/20/2024  Medication Sig   aspirin  EC 81 MG tablet Take 81 mg by mouth daily.   Evolocumab  (REPATHA  SURECLICK) 140 MG/ML SOAJ Inject 140 mg into the skin every 14 (fourteen) days.   metoprolol  succinate (TOPROL -XL) 25 MG 24 hr tablet Take 1 tablet (25 mg total) by mouth daily.   nitroGLYCERIN  (NITROSTAT ) 0.4 MG SL tablet Place 1 tablet (0.4 mg total) under the tongue every 5 (five) minutes x 3 doses as needed for chest pain (if no relief after 2nd dose, proceed to ED or call 911).   prasugrel  (EFFIENT ) 10 MG  TABS tablet Take 1 tablet by mouth once daily   predniSONE  (DELTASONE ) 20 MG tablet Take 2 tablets (40 mg total) by mouth daily with breakfast for 5 days.   No facility-administered encounter medications on file as of 01/20/2024.    Allergies  Allergen Reactions   Crestor  [Rosuvastatin  Calcium ] Other (See Comments)    Myalgias   Plavix  [Clopidogrel  Bisulfate]     Nonresponder to Plavix    Codeine Nausea And Vomiting    Pertinent ROS per HPI, otherwise unremarkable      Objective:  BP 121/66   Pulse 76   Temp (!) 97.3 F (36.3 C)   Ht 5' 4 (1.626 m)   Wt 213 lb 12.8 oz (97 kg)   SpO2 95%   BMI 36.70 kg/m    Wt Readings from Last 3 Encounters:  01/20/24 213 lb 12.8 oz (97 kg)  12/17/23 200 lb (90.7 kg)  11/13/23 210 lb 3.2 oz (95.3 kg)    Physical Exam Vitals and nursing note reviewed.  Constitutional:      General: She is not in acute distress.    Appearance: Normal appearance. She is well-developed and well-groomed. She is morbidly obese. She is not ill-appearing, toxic-appearing or diaphoretic.  HENT:     Head: Normocephalic and atraumatic.     Nose: Nose normal.     Mouth/Throat:     Mouth: Mucous membranes are moist.  Eyes:     Conjunctiva/sclera: Conjunctivae normal.     Pupils: Pupils are equal, round, and reactive to light.  Neck:     Vascular: No carotid bruit.  Cardiovascular:     Rate and Rhythm: Normal rate and regular rhythm.     Pulses: Normal pulses.     Heart sounds: Normal heart sounds. No murmur heard.    No friction rub. No gallop.  Musculoskeletal:     Cervical back: Normal range of motion and neck supple.     Right lower leg: No edema.     Left lower leg: Edema present.     Comments: Left thumb: locking in place with extension, able to overcome the locking in place without intervention. Slight swelling and tenderness at base of thumb.   Skin:    General: Skin is warm and dry.     Capillary Refill:  Capillary refill takes less than 2  seconds.  Neurological:     General: No focal deficit present.     Mental Status: She is alert and oriented to person, place, and time.  Psychiatric:        Mood and Affect: Mood normal.        Behavior: Behavior normal. Behavior is cooperative.        Thought Content: Thought content normal.        Judgment: Judgment normal.      Results for orders placed or performed in visit on 07/29/23  Lipid panel   Collection Time: 10/28/23  9:21 AM  Result Value Ref Range   Cholesterol, Total 92 (L) 100 - 199 mg/dL   Triglycerides 889 0 - 149 mg/dL   HDL 42 >60 mg/dL   VLDL Cholesterol Cal 20 5 - 40 mg/dL   LDL Chol Calc (NIH) 30 0 - 99 mg/dL   Chol/HDL Ratio 2.2 0.0 - 4.4 ratio       Pertinent labs & imaging results that were available during my care of the patient were reviewed by me and considered in my medical decision making.  Assessment & Plan:  Calley was seen today for establish care and medical management of chronic issues.  Diagnoses and all orders for this visit:  Unstable angina (HCC) -     CMP14+EGFR -     CBC with Differential/Platelet -     TSH -     T4, Free  Obesity, morbid (HCC) -     Bayer DCA Hb A1c Waived -     CMP14+EGFR -     CBC with Differential/Platelet -     TSH -     T4, Free -     VITAMIN D  25 Hydroxy (Vit-D Deficiency, Fractures)  Hyperlipidemia with target LDL less than 100 -     CMP14+EGFR -     CBC with Differential/Platelet -     TSH -     T4, Free  History of non-ST elevation myocardial infarction (NSTEMI) -     CMP14+EGFR -     CBC with Differential/Platelet -     TSH -     T4, Free  Stented coronary artery -     CMP14+EGFR -     CBC with Differential/Platelet -     TSH -     T4, Free  Essential hypertension -     CMP14+EGFR -     CBC with Differential/Platelet -     TSH -     T4, Free  Carotid stenosis, bilateral -     US  Carotid Duplex Bilateral; Future -     CMP14+EGFR -     CBC with Differential/Platelet -      TSH -     T4, Free  Hypocalcemia -     CMP14+EGFR -     VITAMIN D  25 Hydroxy (Vit-D Deficiency, Fractures)  Elevated glucose -     Bayer DCA Hb A1c Waived -     CMP14+EGFR  Trigger finger of left thumb -     predniSONE  (DELTASONE ) 20 MG tablet; Take 2 tablets (40 mg total) by mouth daily with breakfast for 5 days.  Encounter for screening mammogram for malignant neoplasm of breast -     MM DIGITAL SCREENING BILATERAL; Future  Encounter for osteoporosis screening in asymptomatic postmenopausal patient -     DG [facility] DEXA -     CMP14+EGFR -  VITAMIN D  25 Hydroxy (Vit-D Deficiency, Fractures)     Coronary artery disease with prior stent placement No recent angina symptoms, chest pain, shortness of breath, or dizziness. Continuing dual antiplatelet therapy with Effient  and aspirin  due to lack of alternative options and previous bypass not performed. - Continue Effient  and aspirin  as dual antiplatelet therapy.  Carotid artery stenosis Identified on previous CT angio chest. No interventions performed yet. No symptoms of dizziness reported. Carotid ultrasound ordered in February was not completed. - Reorder carotid ultrasound to assess stenosis severity.  Hyperlipidemia, on Repatha  Well-controlled with Repatha . No side effects reported. Significant reduction in LDL cholesterol levels. - Continue Repatha  as prescribed.  Trigger thumb Trigger thumb with locking for the past two weeks. Inflammation causing the pulley system to catch. NSAIDs contraindicated due to cardiac history. - Prescribe prednisone  burst: 2 pills every morning for 5 days. - Recommend over-the-counter thumb brace to reduce movement and inflammation. - If symptoms persist or worsen, consider corticosteroid injection.  Left ankle edema Intermittent left ankle edema, particularly after prolonged sitting. No significant findings on leg ultrasound. - Recommend wearing compression socks to reduce edema. - Encourage  regular movement and avoiding prolonged sitting.  Hypocalcemia Previous labs showed low calcium  levels. No history of low vitamin D  reported, but often associated. - Recommend daily Os-Cal (calcium  and vitamin D ) supplementation. - Repeat calcium  and vitamin D  levels with current labs.  Renal function abnormality Previous labs indicated renal function abnormality. - Repeat renal function tests with current labs to assess status.  Elevated blood glucose Slightly elevated glucose levels noted in previous labs. - Order HbA1c to assess average blood glucose over the past 3 months.          Continue all other maintenance medications.  Follow up plan: Return in about 6 months (around 07/19/2024) for Annual Physical.   Continue healthy lifestyle choices, including diet (rich in fruits, vegetables, and lean proteins, and low in salt and simple carbohydrates) and exercise (at least 30 minutes of moderate physical activity daily).  Educational handout given for trigger finger, health maintenance   The above assessment and management plan was discussed with the patient. The patient verbalized understanding of and has agreed to the management plan. Patient is aware to call the clinic if they develop any new symptoms or if symptoms persist or worsen. Patient is aware when to return to the clinic for a follow-up visit. Patient educated on when it is appropriate to go to the emergency department.   Rosaline Bruns, FNP-C [facility] [phone]

## 2024-01-21 DIAGNOSIS — E559 Vitamin D deficiency, unspecified: Secondary | ICD-10-CM | POA: Insufficient documentation

## 2024-01-21 LAB — CMP14+EGFR
ALT: 11 IU/L (ref 0–32)
AST: 15 IU/L (ref 0–40)
Albumin: 4.1 g/dL (ref 3.8–4.8)
Alkaline Phosphatase: 97 IU/L (ref 49–135)
BUN/Creatinine Ratio: 12 (ref 12–28)
BUN: 12 mg/dL (ref 8–27)
Bilirubin Total: <0.2 mg/dL (ref 0.0–1.2)
CO2: 24 mmol/L (ref 20–29)
Calcium: 9.3 mg/dL (ref 8.7–10.3)
Chloride: 105 mmol/L (ref 96–106)
Creatinine, Ser: 0.99 mg/dL (ref 0.57–1.00)
Globulin, Total: 2.4 g/dL (ref 1.5–4.5)
Glucose: 104 mg/dL — ABNORMAL HIGH (ref 70–99)
Potassium: 5.2 mmol/L (ref 3.5–5.2)
Sodium: 141 mmol/L (ref 134–144)
Total Protein: 6.5 g/dL (ref 6.0–8.5)
eGFR: 61 mL/min/1.73 (ref >59)

## 2024-01-21 LAB — CBC WITH DIFFERENTIAL/PLATELET
Basophils Absolute: 0 x10E3/uL (ref 0.0–0.2)
Basos: 1 %
EOS (ABSOLUTE): 0.2 x10E3/uL (ref 0.0–0.4)
Eos: 3 %
Hematocrit: 44.5 % (ref 34.0–46.6)
Hemoglobin: 15.1 g/dL (ref 11.1–15.9)
Immature Grans (Abs): 0 x10E3/uL (ref 0.0–0.1)
Immature Granulocytes: 0 %
Lymphocytes Absolute: 2.5 x10E3/uL (ref 0.7–3.1)
Lymphs: 31 %
MCH: 33.2 pg — ABNORMAL HIGH (ref 26.6–33.0)
MCHC: 33.9 g/dL (ref 31.5–35.7)
MCV: 98 fL — ABNORMAL HIGH (ref 79–97)
Monocytes Absolute: 0.8 x10E3/uL (ref 0.1–0.9)
Monocytes: 10 %
Neutrophils Absolute: 4.6 x10E3/uL (ref 1.4–7.0)
Neutrophils: 55 %
Platelets: 312 x10E3/uL (ref 150–450)
RBC: 4.55 x10E6/uL (ref 3.77–5.28)
RDW: 13.1 % (ref 11.7–15.4)
WBC: 8.2 x10E3/uL (ref 3.4–10.8)

## 2024-01-21 LAB — TSH: TSH: 1.6 u[IU]/mL (ref 0.450–4.500)

## 2024-01-21 LAB — VITAMIN D 25 HYDROXY (VIT D DEFICIENCY, FRACTURES): Vit D, 25-Hydroxy: 13.4 ng/mL — AB (ref 30.0–100.0)

## 2024-01-21 LAB — T4, FREE: Free T4: 1.05 ng/dL (ref 0.82–1.77)

## 2024-01-21 MED ORDER — VITAMIN D (ERGOCALCIFEROL) 1.25 MG (50000 UNIT) PO CAPS: 50000.0000 [IU] | ORAL_CAPSULE | ORAL | 0 refills | Status: AC

## 2024-01-23 ENCOUNTER — Ambulatory Visit: Admitting: Family Medicine

## 2024-02-02 ENCOUNTER — Other Ambulatory Visit

## 2024-02-09 ENCOUNTER — Encounter

## 2024-03-11 ENCOUNTER — Other Ambulatory Visit: Payer: Self-pay | Admitting: Family Medicine

## 2024-03-11 DIAGNOSIS — Z1231 Encounter for screening mammogram for malignant neoplasm of breast: Secondary | ICD-10-CM

## 2024-03-24 ENCOUNTER — Inpatient Hospital Stay: Admission: RE | Admit: 2024-03-24 | Source: Ambulatory Visit

## 2024-10-22 ENCOUNTER — Encounter: Payer: Self-pay | Admitting: Family Medicine
